# Patient Record
Sex: Male | Born: 1950 | Race: Black or African American | Hispanic: No | Marital: Single | Smoking: Current every day smoker
Health system: Southern US, Community
[De-identification: ages and names within clinical notes are randomized; demographics above are authoritative.]

## PROBLEM LIST (undated history)

## (undated) DIAGNOSIS — K219 Gastro-esophageal reflux disease without esophagitis: Secondary | ICD-10-CM

## (undated) DIAGNOSIS — M5431 Sciatica, right side: Secondary | ICD-10-CM

## (undated) DIAGNOSIS — M1990 Unspecified osteoarthritis, unspecified site: Secondary | ICD-10-CM

## (undated) DIAGNOSIS — M5432 Sciatica, left side: Secondary | ICD-10-CM

## (undated) DIAGNOSIS — E785 Hyperlipidemia, unspecified: Secondary | ICD-10-CM

## (undated) DIAGNOSIS — G629 Polyneuropathy, unspecified: Secondary | ICD-10-CM

## (undated) DIAGNOSIS — I10 Essential (primary) hypertension: Secondary | ICD-10-CM

## (undated) DIAGNOSIS — R9431 Abnormal electrocardiogram [ECG] [EKG]: Secondary | ICD-10-CM

## (undated) DIAGNOSIS — M549 Dorsalgia, unspecified: Secondary | ICD-10-CM

## (undated) DIAGNOSIS — Z8719 Personal history of other diseases of the digestive system: Secondary | ICD-10-CM

## (undated) DIAGNOSIS — R202 Paresthesia of skin: Secondary | ICD-10-CM

## (undated) DIAGNOSIS — E669 Obesity, unspecified: Secondary | ICD-10-CM

## (undated) DIAGNOSIS — R0602 Shortness of breath: Secondary | ICD-10-CM

## (undated) DIAGNOSIS — Z87898 Personal history of other specified conditions: Secondary | ICD-10-CM

## (undated) DIAGNOSIS — G8929 Other chronic pain: Secondary | ICD-10-CM

## (undated) DIAGNOSIS — Z720 Tobacco use: Secondary | ICD-10-CM

## (undated) DIAGNOSIS — M25552 Pain in left hip: Secondary | ICD-10-CM

## (undated) DIAGNOSIS — M25551 Pain in right hip: Secondary | ICD-10-CM

## (undated) HISTORY — DX: Tobacco use: Z72.0

## (undated) HISTORY — DX: Obesity, unspecified: E66.9

## (undated) HISTORY — DX: Abnormal electrocardiogram (ECG) (EKG): R94.31

## (undated) HISTORY — DX: Essential (primary) hypertension: I10

## (undated) HISTORY — PX: COLONOSCOPY: SHX174

## (undated) HISTORY — DX: Hyperlipidemia, unspecified: E78.5

## (undated) HISTORY — DX: Gastro-esophageal reflux disease without esophagitis: K21.9

## (undated) HISTORY — PX: JOINT REPLACEMENT: SHX530

## (undated) HISTORY — DX: Unspecified osteoarthritis, unspecified site: M19.90

## (undated) HISTORY — PX: CATARACT EXTRACTION: SUR2

---

## 1995-05-21 HISTORY — PX: HERNIA REPAIR: SHX51

## 2000-11-25 ENCOUNTER — Ambulatory Visit (HOSPITAL_COMMUNITY): Admission: RE | Admit: 2000-11-25 | Discharge: 2000-11-25 | Payer: Self-pay | Admitting: Family Medicine

## 2000-11-25 ENCOUNTER — Encounter: Payer: Self-pay | Admitting: Family Medicine

## 2001-12-12 ENCOUNTER — Emergency Department (HOSPITAL_COMMUNITY): Admission: EM | Admit: 2001-12-12 | Discharge: 2001-12-12 | Payer: Self-pay | Admitting: Emergency Medicine

## 2002-05-20 HISTORY — PX: LUMBAR SPINE SURGERY: SHX701

## 2002-07-22 ENCOUNTER — Encounter: Payer: Self-pay | Admitting: Family Medicine

## 2002-07-22 ENCOUNTER — Ambulatory Visit (HOSPITAL_COMMUNITY): Admission: RE | Admit: 2002-07-22 | Discharge: 2002-07-22 | Payer: Self-pay | Admitting: Family Medicine

## 2002-08-09 ENCOUNTER — Ambulatory Visit (HOSPITAL_COMMUNITY): Admission: RE | Admit: 2002-08-09 | Discharge: 2002-08-09 | Payer: Self-pay | Admitting: Neurosurgery

## 2002-08-11 ENCOUNTER — Encounter: Payer: Self-pay | Admitting: Neurosurgery

## 2002-08-16 ENCOUNTER — Ambulatory Visit (HOSPITAL_COMMUNITY): Admission: RE | Admit: 2002-08-16 | Discharge: 2002-08-16 | Payer: Self-pay | Admitting: *Deleted

## 2002-09-02 ENCOUNTER — Encounter: Admission: RE | Admit: 2002-09-02 | Discharge: 2002-09-02 | Payer: Self-pay | Admitting: Family Medicine

## 2002-09-02 ENCOUNTER — Encounter: Payer: Self-pay | Admitting: Family Medicine

## 2002-09-27 ENCOUNTER — Encounter: Payer: Self-pay | Admitting: *Deleted

## 2002-09-27 ENCOUNTER — Encounter (HOSPITAL_COMMUNITY): Admission: RE | Admit: 2002-09-27 | Discharge: 2002-10-27 | Payer: Self-pay | Admitting: *Deleted

## 2002-10-01 ENCOUNTER — Encounter: Payer: Self-pay | Admitting: Neurosurgery

## 2002-10-01 ENCOUNTER — Ambulatory Visit (HOSPITAL_COMMUNITY): Admission: RE | Admit: 2002-10-01 | Discharge: 2002-10-02 | Payer: Self-pay | Admitting: Neurosurgery

## 2002-11-02 ENCOUNTER — Encounter (HOSPITAL_COMMUNITY): Admission: RE | Admit: 2002-11-02 | Discharge: 2002-12-07 | Payer: Self-pay | Admitting: Neurosurgery

## 2003-05-21 HISTORY — PX: TOTAL HIP ARTHROPLASTY: SHX124

## 2003-12-29 ENCOUNTER — Encounter: Admission: RE | Admit: 2003-12-29 | Discharge: 2004-01-06 | Payer: Self-pay | Admitting: Orthopaedic Surgery

## 2004-01-03 ENCOUNTER — Inpatient Hospital Stay (HOSPITAL_COMMUNITY): Admission: RE | Admit: 2004-01-03 | Discharge: 2004-01-06 | Payer: Self-pay | Admitting: Orthopaedic Surgery

## 2006-02-21 ENCOUNTER — Encounter: Payer: Self-pay | Admitting: Cardiology

## 2006-02-21 LAB — CONVERTED CEMR LAB
Hemoglobin: 16.5 g/dL
MCV: 93 fL
WBC: 10.2 10*3/uL

## 2006-03-04 ENCOUNTER — Ambulatory Visit: Payer: Self-pay | Admitting: Cardiology

## 2006-03-13 ENCOUNTER — Ambulatory Visit: Payer: Self-pay | Admitting: Cardiology

## 2006-03-13 ENCOUNTER — Encounter (HOSPITAL_COMMUNITY): Admission: RE | Admit: 2006-03-13 | Discharge: 2006-04-12 | Payer: Self-pay | Admitting: Cardiology

## 2006-04-07 ENCOUNTER — Ambulatory Visit: Payer: Self-pay | Admitting: Cardiology

## 2006-05-14 ENCOUNTER — Ambulatory Visit: Payer: Self-pay | Admitting: Cardiology

## 2006-05-15 ENCOUNTER — Encounter: Payer: Self-pay | Admitting: Cardiology

## 2006-05-15 LAB — CONVERTED CEMR LAB
BUN: 19 mg/dL
Chloride: 103 meq/L

## 2006-06-02 ENCOUNTER — Ambulatory Visit: Payer: Self-pay | Admitting: Cardiology

## 2007-04-11 ENCOUNTER — Emergency Department (HOSPITAL_COMMUNITY): Admission: EM | Admit: 2007-04-11 | Discharge: 2007-04-11 | Payer: Self-pay | Admitting: Emergency Medicine

## 2007-04-11 ENCOUNTER — Ambulatory Visit (HOSPITAL_COMMUNITY): Admission: RE | Admit: 2007-04-11 | Discharge: 2007-04-11 | Payer: Self-pay | Admitting: Emergency Medicine

## 2007-04-18 ENCOUNTER — Encounter: Admission: RE | Admit: 2007-04-18 | Discharge: 2007-04-18 | Payer: Self-pay | Admitting: Orthopaedic Surgery

## 2007-04-30 ENCOUNTER — Ambulatory Visit (HOSPITAL_COMMUNITY): Admission: RE | Admit: 2007-04-30 | Discharge: 2007-05-01 | Payer: Self-pay | Admitting: Neurosurgery

## 2007-12-28 ENCOUNTER — Ambulatory Visit (HOSPITAL_COMMUNITY): Admission: RE | Admit: 2007-12-28 | Discharge: 2007-12-28 | Payer: Self-pay | Admitting: Family Medicine

## 2008-08-16 ENCOUNTER — Ambulatory Visit (HOSPITAL_COMMUNITY): Admission: RE | Admit: 2008-08-16 | Discharge: 2008-08-16 | Payer: Self-pay | Admitting: Family Medicine

## 2008-10-10 ENCOUNTER — Encounter: Payer: Self-pay | Admitting: Cardiology

## 2009-01-25 ENCOUNTER — Encounter: Payer: Self-pay | Admitting: Cardiology

## 2009-05-08 ENCOUNTER — Inpatient Hospital Stay (HOSPITAL_COMMUNITY): Admission: EM | Admit: 2009-05-08 | Discharge: 2009-05-11 | Payer: Self-pay | Admitting: Emergency Medicine

## 2009-05-16 ENCOUNTER — Telehealth (INDEPENDENT_AMBULATORY_CARE_PROVIDER_SITE_OTHER): Payer: Self-pay

## 2009-05-29 DIAGNOSIS — I10 Essential (primary) hypertension: Secondary | ICD-10-CM | POA: Insufficient documentation

## 2009-06-09 ENCOUNTER — Encounter: Payer: Self-pay | Admitting: Adult Health

## 2009-06-09 ENCOUNTER — Ambulatory Visit: Payer: Self-pay | Admitting: Cardiology

## 2009-06-10 ENCOUNTER — Encounter (INDEPENDENT_AMBULATORY_CARE_PROVIDER_SITE_OTHER): Payer: Self-pay | Admitting: *Deleted

## 2009-06-10 LAB — CONVERTED CEMR LAB
ALT: 22 units/L
AST: 18 units/L
Alkaline Phosphatase: 43 units/L
Alkaline Phosphatase: 43 units/L
Alkaline Phosphatase: 43 units/L
Bilirubin, Direct: 0.1 mg/dL
Bilirubin, Direct: 0.1 mg/dL
Cholesterol: 138 mg/dL
HDL: 32 mg/dL
HDL: 32 mg/dL
LDL Cholesterol: 72 mg/dL
LDL Cholesterol: 72 mg/dL
Total Protein: 7 g/dL
Triglycerides: 172 mg/dL

## 2009-06-12 ENCOUNTER — Encounter: Payer: Self-pay | Admitting: Adult Health

## 2009-06-16 ENCOUNTER — Encounter (INDEPENDENT_AMBULATORY_CARE_PROVIDER_SITE_OTHER): Payer: Self-pay | Admitting: *Deleted

## 2009-06-21 ENCOUNTER — Ambulatory Visit: Payer: Self-pay | Admitting: Cardiology

## 2009-06-21 ENCOUNTER — Encounter (HOSPITAL_COMMUNITY): Admission: RE | Admit: 2009-06-21 | Discharge: 2009-07-21 | Payer: Self-pay | Admitting: Cardiology

## 2009-06-21 ENCOUNTER — Encounter: Payer: Self-pay | Admitting: Cardiology

## 2009-06-23 ENCOUNTER — Encounter (INDEPENDENT_AMBULATORY_CARE_PROVIDER_SITE_OTHER): Payer: Self-pay | Admitting: *Deleted

## 2009-06-26 ENCOUNTER — Ambulatory Visit: Payer: Self-pay | Admitting: Cardiology

## 2010-01-23 ENCOUNTER — Encounter (INDEPENDENT_AMBULATORY_CARE_PROVIDER_SITE_OTHER): Payer: Self-pay | Admitting: *Deleted

## 2010-01-25 ENCOUNTER — Ambulatory Visit: Payer: Self-pay | Admitting: Cardiology

## 2010-01-25 DIAGNOSIS — Z720 Tobacco use: Secondary | ICD-10-CM

## 2010-01-25 DIAGNOSIS — E119 Type 2 diabetes mellitus without complications: Secondary | ICD-10-CM | POA: Insufficient documentation

## 2010-01-25 DIAGNOSIS — E785 Hyperlipidemia, unspecified: Secondary | ICD-10-CM

## 2010-01-25 DIAGNOSIS — E669 Obesity, unspecified: Secondary | ICD-10-CM | POA: Insufficient documentation

## 2010-01-26 ENCOUNTER — Encounter: Payer: Self-pay | Admitting: Cardiology

## 2010-02-19 ENCOUNTER — Ambulatory Visit (HOSPITAL_COMMUNITY): Admission: RE | Admit: 2010-02-19 | Discharge: 2010-02-19 | Payer: Self-pay | Admitting: Family Medicine

## 2010-03-24 ENCOUNTER — Encounter: Payer: Self-pay | Admitting: Cardiology

## 2010-03-24 LAB — CONVERTED CEMR LAB
ALT: 23 units/L (ref 0–53)
Alkaline Phosphatase: 49 units/L (ref 39–117)
Basophils Absolute: 0.1 10*3/uL (ref 0.0–0.1)
Eosinophils Absolute: 0.4 10*3/uL (ref 0.0–0.7)
Eosinophils Relative: 4 % (ref 0–5)
Glucose, Bld: 77 mg/dL (ref 70–99)
HCT: 45.3 % (ref 39.0–52.0)
LDL Cholesterol: 49 mg/dL (ref 0–99)
MCV: 94 fL (ref 78.0–100.0)
Platelets: 279 10*3/uL (ref 150–400)
RDW: 14.1 % (ref 11.5–15.5)
Sodium: 143 meq/L (ref 135–145)
Total Bilirubin: 0.3 mg/dL (ref 0.3–1.2)
Total Protein: 6.7 g/dL (ref 6.0–8.3)
Triglycerides: 127 mg/dL (ref <150)
VLDL: 25 mg/dL (ref 0–40)

## 2010-06-01 ENCOUNTER — Encounter
Admission: RE | Admit: 2010-06-01 | Discharge: 2010-06-01 | Payer: Self-pay | Source: Home / Self Care | Attending: Emergency Medicine | Admitting: Emergency Medicine

## 2010-06-19 NOTE — Miscellaneous (Signed)
Summary: lipidslft  Clinical Lists Changes  Observations: Added new observation of ALBUMIN: 4.6 g/dL (16/02/9603 5:40) Added new observation of PROTEIN, TOT: 7.0 g/dL (98/03/9146 8:29) Added new observation of SGPT (ALT): 22 units/L (06/10/2009 9:54) Added new observation of SGOT (AST): 18 units/L (06/10/2009 9:54) Added new observation of ALK PHOS: 43 units/L (06/10/2009 9:54) Added new observation of BILI DIRECT: 0.1 mg/dL (56/21/3086 5:78) Added new observation of LDL: 72 mg/dL (46/96/2952 8:41) Added new observation of HDL: 32 mg/dL (32/44/0102 7:25) Added new observation of TRIGLYC TOT: 172 mg/dL (36/64/4034 7:42) Added new observation of CHOLESTEROL: 138 mg/dL (59/56/3875 6:43)

## 2010-06-19 NOTE — Letter (Signed)
Summary: [facility]GRESS NOTE 10/24/09  [facility] PROGRESS NOTE 10/24/09   Imported By: Faythe Ghee 01/26/2010 11:59:48  _____________________________________________________________________  External Attachment:    Type:   Image     Comment:   External Document

## 2010-06-19 NOTE — Assessment & Plan Note (Signed)
Summary: past due for f/u per Larita Fife for meds/tg   Visit Type:  Follow-up Primary Provider:  Dr.Angus Mcinnis   History of Present Illness: Mr.Sean [name] is a moribly obese AAM who have have not seen for 20 months with multiple CVRF of DM,. Hypertension, Mixed hyperlipidemia, and ongoing tobacco abuse.  He is here for follow-up and med refills.  He denies any symptoms of chest pain, DOE, or decreased energy.  He continues to smoke 2ppd. He has no plans to quit.  He has had a recent admission to [facility] secondary to UTI in December of 2010 along with near syncope. He has been without conplaint since that time.  He does not exercise much secondary to arthritic hips and and L hip replacement.  He unfortunately has gained 23 lbs since we saw him in 2008.    Preventive Screening-Counseling & Management  Alcohol-Tobacco     Smoking Status: current     Smoking Cessation Counseling: yes     Smoke Cessation Stage: precontemplative     Packs/Day: 2.0  Current Problems (verified): 1)  Dyslipidemia  (ICD-272.4) 2)  Hypertension  (ICD-401.9) 3)  Dm  (ICD-250.00)  Current Medications (verified): 1)  Lotrel 10-40 Mg Caps (Amlodipine Besy-Benazepril Hcl) .... Take 1 Cap By Mouth Daily 2)  Niaspan 500 Mg Cr-Tabs (Niacin (Antihyperlipidemic)) .... Take 3 Tablets By Mouth Once Daily 3)  Toprol Xl 100 Mg Xr24h-Tab (Metoprolol Succinate) .... Take 1 Tablet By Mouth Once Daily 4)  Meloxicam 15 Mg Tabs (Meloxicam) .... Take 1 Tab Daily 5)  Chlorthalidone 25 Mg Tabs (Chlorthalidone) .... Take 1 Tab Daily 6)  Nexium 40 Mg Cpdr (Esomeprazole Magnesium) .... Take 1 Tab Daily 7)  Metformin Hcl 500 Mg Tabs (Metformin Hcl) .... Take 1 Tab Two Times A Day 8)  Cardura 4 Mg Tabs (Doxazosin Mesylate) .... Take 1 Tab Daily 9)  Trilipix 135 Mg Cpdr (Choline Fenofibrate) .... Take 1 Tab Daily 10)  Vytorin 10-40 Mg Tabs (Ezetimibe-Simvastatin) .... Take 1 Tab Daily 11)  Glipizide 10 Mg Tabs (Glipizide) .... Take 1 Tab Daily 12)   Aspirin 325 Mg Tabs (Aspirin) .... Take 1 Tab Daily 13)  Hydrocodone-Acetaminophen 5-500 Mg Tabs (Hydrocodone-Acetaminophen) .... Take As Directed  Allergies (verified): No Known Drug Allergies  Past History:  Past medical, surgical, family and social histories (including risk factors) reviewed, and no changes noted (except as noted below).  Past Medical History: Reviewed history from 05/29/2009 and no changes required. Current Problems:  DYSLIPIDEMIA (ICD-272.4) HYPERTENSION (ICD-401.9) DM (ICD-250.00)  Past Surgical History: Reviewed history from 05/29/2009 and no changes required. right total hip replacement  Family History: Reviewed history and no changes required.  Social History: Reviewed history from 05/29/2009 and no changes required. Full Time Single  Tobacco Use - Yes.  Alcohol Use - no Regular Exercise - no Drug Use - no Packs/Day:  2.0  Review of Systems       arthritic pain.  All other systems have been reviewed and are negative unless stated above.   Vital Signs:  Patient profile:   60 year old male Height:      73 inches Weight:      290 pounds BMI:     38.40 Pulse rate:   80 / minute BP sitting:   130 / 81  (right arm)  Vitals Entered By: Dreama Saa, CNA (June 09, 2009 11:03 AM)  Physical Exam  General:  Well developed, well nourished, in no acute distress. Head:  normocephalic and atraumatic Eyes:  PERRLA/EOM intact; conjunctiva and lids normal. Wears glasses Lungs:  Clear bilaterally to auscultation and percussion. Slight prolonged expiratory phase with scattered wheezes on expiration as well Heart:  S3 is noted but no evidence of CHF Abdomen:  Obese, NT 2+ BS Msk:  Dificulty with walking secondary to chronic hip pain Extremities:  trace left pedal edema and trace right pedal edema.   Neurologic:  Alert and oriented x 3. Psych:  Normal affect.   EKG  Procedure date:  06/09/2009  Findings:      Normal sinus rhythm with rate  of:  79bpm  Impression & Recommendations:  Problem # 1:  HYPERTENSION (ICD-401.9) BP is well controlled on current regimine.  I will order ECHO for LV fx in the setting of  longstanding HTN.  He will be also scheduled for Lexascan stress myoview with multiple CVRF.   His updated medication list for this problem includes:    Lotrel 10-40 Mg Caps (Amlodipine besy-benazepril hcl) .Marland Kitchen... Take 1 cap by mouth daily    Toprol Xl 100 Mg Xr24h-tab (Metoprolol succinate) .Marland Kitchen... Take 1 tablet by mouth once daily    Chlorthalidone 25 Mg Tabs (Chlorthalidone) .Marland Kitchen... Take 1 tab daily    Cardura 4 Mg Tabs (Doxazosin mesylate) .Marland Kitchen... Take 1 tab daily    Aspirin 325 Mg Tabs (Aspirin) .Marland Kitchen... Take 1 tab daily  Orders: 2-D Echocardiogram (2D Echo) Nuclear Stress Test (Nuc Stress Test)  Problem # 2:  DYSLIPIDEMIA (ICD-272.4) Fasting lipids and LFT's His updated medication list for this problem includes:    Niaspan 500 Mg Cr-tabs (Niacin (antihyperlipidemic)) .Marland Kitchen... Take 3 tablets by mouth once daily    Trilipix 135 Mg Cpdr (Choline fenofibrate) .Marland Kitchen... Take 1 tab daily    Vytorin 10-40 Mg Tabs (Ezetimibe-simvastatin) .Marland Kitchen... Take 1 tab daily  Future Orders: T-Hepatic Function [id number]) ... 06/10/2009 T-Lipid Profile [phone]) ... 06/10/2009  Problem # 3:  TOBACCO ABUSE (ICD-305.1) Advised to stop smoking.  He is unwilling to do so.  Patient Instructions: 1)  Your physician recommends that you schedule a follow-up appointment in: after test 2)  Your physician recommends that you return for lab work in: Next week 3)  Your physician has requested that you have an echocardiogram.  Echocardiography is a painless test that uses sound waves to create images of your heart. It provides your doctor with information about the size and shape of your heart and how well your heart's chambers and valves are working.  This procedure takes approximately one hour. There are no restrictions for this procedure. 4)  Your  physician has requested that you have an [employer]  For further information please visit [email or web address]  Please follow instruction sheet, as given.

## 2010-06-19 NOTE — Letter (Signed)
Summary: [place] [facility] Work Engineer, agricultural at [employer]  [address] [place] [address]   Phone: [phone]  Fax: [phone]     January 25, 2010 MRN: [id number]   [facility] [address] [place], [place]  [address]      YOUR LAB WORK IS DUE  March 27, 2010 _________________________________________  Please go to [facility], located across the street from [facility] on the second floor.  Hours are Monday - Friday 7am until 7:30pm         Saturday 8am until 12noon    _X_  DO NOT EAT OR DRINK AFTER MIDNIGHT EVENING PRIOR TO LABWORK  __ YOUR LABWORK IS NOT FASTING --YOU MAY EAT PRIOR TO LABWORK

## 2010-06-19 NOTE — Miscellaneous (Signed)
Summary: LIPIDS,LIVER 05/22/2009  Clinical Lists Changes  Observations: Added new observation of ALBUMIN: 4.6 g/dL (05/28/3233 57:32) Added new observation of PROTEIN, TOT: 7.0 g/dL (20/25/4270 62:37) Added new observation of SGPT (ALT): 22 units/L (06/10/2009 16:16) Added new observation of SGOT (AST): 18 units/L (06/10/2009 16:16) Added new observation of ALK PHOS: 43 units/L (06/10/2009 16:16) Added new observation of BILI DIRECT: 0.1 mg/dL (62/83/1517 61:60) Added new observation of LDL: 72 mg/dL (73/71/0626 94:85) Added new observation of HDL: 32 mg/dL (46/27/0350 09:38) Added new observation of TRIGLYC TOT: 172 mg/dL (18/29/9371 69:67) Added new observation of CHOLESTEROL: 138 mg/dL (89/38/1017 51:02)

## 2010-06-19 NOTE — Assessment & Plan Note (Signed)
Summary: 6 mth f/u per checkout on 06/26/09/tg   Visit Type:  Follow-up Referring Provider:  [facility] Primary Provider:  Chelsea Primus Mcinnis   History of Present Illness: [name] returns to the office as scheduled for continued assessment and treatment of cardiovascular risk factors without known cardiovascular disease.  Since his last visit, he has done superbly.  He experiences no cardiopulmonary symptoms.  His principal problems are orthopaedic with pain in the left hip since THRs some years ago, pain in the right hip and assorted myalgias and arthralgias.  He does not monitor blood pressure and sees his primary care doctor infrequently.  An annual physical is planned for next month.  Unfortunately, he continues to smoke cigarettes at a rate that he will only characterize as "too much".  Current Medications (verified): 1)  Lotrel 10-40 Mg Caps (Amlodipine Besy-Benazepril Hcl) .... Take 1 Cap By Mouth Daily 2)  Niaspan 500 Mg Cr-Tabs (Niacin (Antihyperlipidemic)) .... Take 3 Tablets By Mouth Once Daily 3)  Toprol Xl 100 Mg Xr24h-Tab (Metoprolol Succinate) .... Take 1 Tablet By Mouth Once Daily 4)  Meloxicam 15 Mg Tabs (Meloxicam) .... Take 1 Tab Daily 5)  Chlorthalidone 25 Mg Tabs (Chlorthalidone) .... Take 1 Tab Daily 6)  Nexium 40 Mg Cpdr (Esomeprazole Magnesium) .... Take 1 Tab Daily 7)  Metformin Hcl 500 Mg Tabs (Metformin Hcl) .... Take 1 Tab Two Times A Day 8)  Cardura 4 Mg Tabs (Doxazosin Mesylate) .... Take 1 Tab Daily 9)  Lipitor 40 Mg Tabs (Atorvastatin Calcium) .... Take 1 Tablet By Mouth At Bedtime 10)  Glipizide 10 Mg Tabs (Glipizide) .... Take 1 Tab Daily 11)  Aspirin 325 Mg Tabs (Aspirin) .... Take 1 Tab Daily 12)  Hydrocodone-Acetaminophen 5-500 Mg Tabs (Hydrocodone-Acetaminophen) .... Take As Directed  Allergies (verified): 1)  ! * Catapres Patch  Past History:   PMH, FH, and Social History reviewed and updated.  Review of Systems       See  history of present illness.  Vital Signs:  Patient profile:   60 year old male Weight:      289 pounds BMI:     38.27 Pulse rate:   83 / minute BP sitting:   140 / 88  (right arm)  Vitals Entered By: Dreama Saa, CNA (January 25, 2010 9:37 AM)  Physical Exam  General:  Obese; well developed; no acute distress:   Neck-No JVD; no carotid bruits: HEENT-frequently requested that I repeat myself during our meeting, but hearing was quite acute with air conduction approximately greater than bone conduction and with no lateralization to midline vibration Lungs-No tachypnea, no rales; no rhonchi; no wheezes: Cardiovascular-normal PMI; normal S1 and S2: Abdomen-BS normal; soft and non-tender without masses or organomegaly:  Musculoskeletal-No deformities, no cyanosis or clubbing: Neurologic-Normal cranial nerves; symmetric strength and tone:  Skin-Warm, no significant lesions:multiple small nevi with benign appearance, particularly over the shoulders and back Extremities-Distal pulses intact; no edema:     Impression & Recommendations:  Problem # 1:  TOBACCO ABUSE (ICD-305.1) The importance of discontinuing tobacco use was once again emphasized to Sean [name]; unfortunately, he notes that he enjoys cigarette smoking and is not prepared to attempt to quit.  He has never stopped for any significant amount of time in the past and has no motivation to do so.  Also of note from a health standpoint is that the patient has never undergone colonoscopy.  You may wish to discuss the advisability of doing so with him at  his next visit.  In the interim, I requested stool samples for Hemoccult testing.  Problem # 2:  HYPERLIPIDEMIA (ICD-272.4) Lipid profile earlier this year was excellent as noted below; however, patient is being treated with Vytorin and amlodipine.  The dose of simvastatin is far beyond that recommended by the FDA.  Accordingly, he will change to atorvastatin 40 mg q.d. with a repeat  lipid profile in 2 months.  CHOL: 138 (06/10/2009)   LDL: 72 (06/10/2009)   HDL: 32 (06/10/2009)   TG: 172 (06/10/2009)  Problem # 3:  OBESITY (ICD-278.00) Weight loss is advised; however, the patient reports difficulty with ambulation due to bilateral hip pain and other orthopaedic issues.  The importance of reduction of his caloric intake in the setting was emphasized.  I also suggested that he reconsult Dr. Cleophas Dunker since he has not been free of pain in the left hip since THA 5 years ago.  I will plan to see this nice gentleman again in one year.  Other Orders: Hemoccult Cards (Take Home) (Hemoccult Cards) Future Orders: T-Lipid Profile ([id number]) ... 03/27/2010 T-Comprehensive Metabolic Panel [phone]) ... 03/27/2010 T-CBC w/Diff ([id number]) ... 03/27/2010 T-TSH [phone]) ... 03/27/2010  Patient Instructions: 1)  Your physician recommends that you schedule a follow-up appointment in: 1 year 2)  Your physician recommends that you return for lab work in: 2 months 3)  Your physician has recommended you make the following change in your medication: Stop taking Vytorin and start taking Lipitor 40mg  by mouth at bedtime  4)  Your physician has asked that you test your stool for blood. It is necessary to test 3 different stool specimens for accuracy. You will be given 3 hemoccult cards for specimen collection. For each stool specimen, place a small portion of stool sample (from 2 different areas of the stool) into the 2 squares on the card. Close card. Repeat with 2 more stool specimens. Bring the cards back to the office for testing. Prescriptions: LIPITOR 40 MG TABS (ATORVASTATIN CALCIUM) take 1 tablet by mouth at bedtime  #30 x 11   Entered by:   Larita Fife Via LPN   Authorized by:   Kathlen Brunswick, MD, [facility]   Signed by:   Larita Fife Via LPN on 44/05/270   Method used:   Electronically to        [facility] (retail)       [address]        [place], [place]  [address]       Ph: [phone]       Fax: [phone]   RxID:   [id number]

## 2010-06-19 NOTE — Letter (Signed)
Summary: [address] Results Engineer, agricultural at [facility]  [address], [place] [address]   Phone: [phone]  Fax: [phone]      June 23, 2009 MRN: [id number]   [facility] [address] [place], [place]  [address]   Dear Mr. CULLINANE,  Your test ordered by Selena Batten has been reviewed by your physician (or physician assistant) and was found to be normal or stable. Your physician (or physician assistant) felt no changes were needed at this time.  ____ Echocardiogram  _x___ Cardiac Stress Test  _x___ Lab Work  ____ Peripheral vascular study of arms, legs or neck  ____ CT scan or X-ray  ____ Lung or Breathing test  ____ Other:  No change in medical treatment at this time, per Dr. Dietrich Pates.  Thank you, Rohin Krejci Allyne Gee RN    [place] Bing, MD, Lenise Arena.C.Gaylord Shih, MD, F.A.C.C Lewayne Bunting, MD, F.A.C.C Nona Dell, MD, F.A.C.C Charlton Haws, MD, Lenise Arena.C.C

## 2010-06-19 NOTE — Assessment & Plan Note (Signed)
Summary: f/u myoview and labwork/tg   Primary Provider:  Dr.Angus Mcinnis   History of Present Illness: [name] is a 60 y/0 obese AAM with known history of diabetes, hypertension. mixed hyperlipidemia and ongoing tobacco abuse.  We saw him last on 06/09/2009 on followup post hospitalization.  At that time we refilled his medications and schedule a stress myoview.  He had an episode of syncope prior to admission.  Stress test was negative for ischemia, diaphramatic attenuation was noted.  EF was 56%  We also completed a lipid profile.  TC 138, LDL 72;HDL 32; TG 172.  His main complaint is Right hip pain.  He is putting off having it replaced, but it is significantly limiting his mobility and ability to exercise.  Preventive Screening-Counseling & Management  Alcohol-Tobacco     Smoking Status: current     Smoking Cessation Counseling: yes     Packs/Day: 1.5     Pack years: 60  Current Medications (verified): 1)  Lotrel 10-40 Mg Caps (Amlodipine Besy-Benazepril Hcl) .... Take 1 Cap By Mouth Daily 2)  Niaspan 500 Mg Cr-Tabs (Niacin (Antihyperlipidemic)) .... Take 3 Tablets By Mouth Once Daily 3)  Toprol Xl 100 Mg Xr24h-Tab (Metoprolol Succinate) .... Take 1 Tablet By Mouth Once Daily 4)  Meloxicam 15 Mg Tabs (Meloxicam) .... Take 1 Tab Daily 5)  Chlorthalidone 25 Mg Tabs (Chlorthalidone) .... Take 1 Tab Daily 6)  Nexium 40 Mg Cpdr (Esomeprazole Magnesium) .... Take 1 Tab Daily 7)  Metformin Hcl 500 Mg Tabs (Metformin Hcl) .... Take 1 Tab Two Times A Day 8)  Cardura 4 Mg Tabs (Doxazosin Mesylate) .... Take 1 Tab Daily 9)  Trilipix 135 Mg Cpdr (Choline Fenofibrate) .... Take 1 Tab Daily 10)  Vytorin 10-40 Mg Tabs (Ezetimibe-Simvastatin) .... Take 1 Tab Daily 11)  Glipizide 10 Mg Tabs (Glipizide) .... Take 1 Tab Daily 12)  Aspirin 325 Mg Tabs (Aspirin) .... Take 1 Tab Daily 13)  Hydrocodone-Acetaminophen 5-500 Mg Tabs (Hydrocodone-Acetaminophen) .... Take As Directed  Allergies  (verified): No Known Drug Allergies  Past History:  Past medical, surgical, family and social histories (including risk factors) reviewed, and no changes noted (except as noted below).  Past Medical History: Reviewed history from 05/29/2009 and no changes required. Current Problems:  DYSLIPIDEMIA (ICD-272.4) HYPERTENSION (ICD-401.9) DM (ICD-250.00)  Past Surgical History: Reviewed history from 05/29/2009 and no changes required. right total hip replacement  Family History: Reviewed history and no changes required.  Social History: Reviewed history from 05/29/2009 and no changes required. Full Time Single  Tobacco Use - Yes.  Alcohol Use - no Regular Exercise - no Drug Use - no Packs/Day:  1.5 Pack years:  60  Review of Systems       Chronic R hip pain. All other systems have been reviewed and are negative unless stated above.   Vital Signs:  Patient profile:   60 year old male Weight:      288 pounds Pulse rate:   80 / minute BP sitting:   145 / 90  (left arm)  Vitals Entered ByLarita Fife Via LPN (June 26, 2009 3:33 PM)  Physical Exam  General:  Well developed, well nourished, in no acute distress. Lungs:  Clear bilaterally to auscultation and percussion. Smells of cigarettes Heart:  Non-displaced PMI, chest non-tender; regular rate and rhythm, S1, S2 without murmurs, rubs or gallops. Carotid upstroke normal, no bruit. Normal abdominal aortic size, no bruits. Femorals normal pulses, no bruits. Pedals normal pulses. No edema,  no varicosities. Abdomen:  Bowel sounds positive; abdomen soft and non-tender without masses, organomegaly, or hernias noted. No hepatosplenomegaly. Extremities:  No clubbing or cyanosis. Psych:  Normal affect.   Impression & Recommendations:  Problem # 1:  TOBACCO ABUSE (ICD-305.1) Assessment Comment Only Cessation is recommended to patient.  He has no desire to quit.  Problem # 2:  DYSLIPIDEMIA (ICD-272.4) Assessment: Comment  Only He will need no changes in meds at this time.  Follow-up labs in 6 months. His updated medication list for this problem includes:    Niaspan 500 Mg Cr-tabs (Niacin (antihyperlipidemic)) .Marland Kitchen... Take 3 tablets by mouth once daily    Trilipix 135 Mg Cpdr (Choline fenofibrate) .Marland Kitchen... Take 1 tab daily    Vytorin 10-40 Mg Tabs (Ezetimibe-simvastatin) .Marland Kitchen... Take 1 tab daily  Problem # 3:  HYPERTENSION (ICD-401.9) Assessment: Unchanged Essentially well; controlled.  He complaints of hip pain which causes his BP to increase at times.  He is in pain now. His updated medication list for this problem includes:    Lotrel 10-40 Mg Caps (Amlodipine besy-benazepril hcl) .Marland Kitchen... Take 1 cap by mouth daily    Toprol Xl 100 Mg Xr24h-tab (Metoprolol succinate) .Marland Kitchen... Take 1 tablet by mouth once daily    Chlorthalidone 25 Mg Tabs (Chlorthalidone) .Marland Kitchen... Take 1 tab daily    Cardura 4 Mg Tabs (Doxazosin mesylate) .Marland Kitchen... Take 1 tab daily    Aspirin 325 Mg Tabs (Aspirin) .Marland Kitchen... Take 1 tab daily  Patient Instructions: 1)  Your physician recommends that you schedule a follow-up appointment in: 6 months 2)  Your physician recommends that you continue on your current medications as directed. Please refer to the Current Medication list given to you today.

## 2010-06-19 NOTE — Miscellaneous (Signed)
Summary: labs lipids and liver 06/10/2009  Clinical Lists Changes  Observations: Added new observation of ALBUMIN: 4.6 g/dL (27/25/3664 40:34) Added new observation of PROTEIN, TOT: 7.0 g/dL (74/25/9563 87:56) Added new observation of SGPT (ALT): 22 units/L (06/10/2009 14:43) Added new observation of SGOT (AST): 18 units/L (06/10/2009 14:43) Added new observation of ALK PHOS: 43 units/L (06/10/2009 14:43) Added new observation of BILI DIRECT: 0.1 mg/dL (43/32/9518 84:16) Added new observation of LDL: 72 mg/dL (60/63/0160 10:93) Added new observation of HDL: 32 mg/dL (23/55/7322 02:54) Added new observation of TRIGLYC TOT: 172 mg/dL (27/10/2374 28:31) Added new observation of CHOLESTEROL: 138 mg/dL (51/76/1607 37:10)

## 2010-06-19 NOTE — Miscellaneous (Signed)
Summary: cardura refill  Clinical Lists Changes  Medications: Rx of CARDURA 4 MG TABS (DOXAZOSIN MESYLATE) take 1 tab daily;  #30 x 6;  Signed;  Entered by: Teressa Lower RN;  Authorized by: Joni Reining, NP;  Method used: Electronically to [facility] [address] [place], [place], [place]  [address], Ph: [phone], Fax: [phone]    Prescriptions: CARDURA 4 MG TABS (DOXAZOSIN MESYLATE) take 1 tab daily  #30 x 6   Entered by:   Teressa Lower RN   Authorized by:   Joni Reining, NP   Signed by:   Teressa Lower RN on 06/12/2009   Method used:   Electronically to        [facility] (retail)       [address]       [place], [place]  [address]       Ph: [phone]       Fax: [phone]   RxID:   [id number]

## 2010-06-19 NOTE — Letter (Signed)
Summary: [place] Treadmill (Nuc Med Stress)  [facility]  [address] [place] [address]   Phone: [phone]  Fax: [phone]    Nuclear Medicine 1-Day Stress Test Information Sheet  Re:     Sean [name]   DOB:     [birth year] MRN:     [id number] Weight:  Appointment Date: Register at: Appointment Time: Referring MD:  ___Exercise Stress  __Adenosine   __Dobutamine  _X_Lexiscan  __Persantine   __Thallium  Urgency: ____1 (next day)   ____2 (one week)    ____3 (PRN)  Patient will receive Follow Up call with results: Patient needs follow-up appointment:  Instructions regarding medication:  How to prepare for your stress test: 1. DO NOT eat or dring 6 hours prior to your arrival time. This includes no caffeine (coffee, tea, sodas, chocolate) if you were instructed to take your medications, drink water with it. 2. DO NOT use any tobacco products for at leaset 8 hours prior to arrival. 3. DO NOT wear dresses or any clothing that may have metal clasps or buttons. 4. Wear short sleeve shirts, loose clothing, and comfortalbe walking shoes. 5. DO NOT use lotions, oils or powder on your chest before the test. 6. The test will take approximately 3-4 hours from the time you arrive until completion. 7. To register the day of the test, go to the Short Stay entrance at [facility]. 8. If you must cancel your test, call [phone] as soon as you are aware.  After you arrive for test:   When you arrive at [facility], you will go to Short Stay to be registered. They will then send you to Radiology to check in. The Nuclear Medicine Tech will get you and start an IV in your arm or hand. A small amount of a radioactive tracer will then be injected into your IV. This tracer will then have to circulate for 30-45 minutes. During this time you will wait in the waiting room and you will be able to drink something without caffeine. A series of pictures will be taken  of your heart follwoing this waiting period. After the 1st set of pictures you will go to the stress lab to get ready for your stress test. During the stress test, another small amount of a radioactive tracer will be injected through your IV. When the stress test is complete, there is a short rest period while your heart rate and blood pressure will be monitored. When this monitoring period is complete you will have another set of pictrues taken. (The same as the 1st set of pictures). These pictures are taken between 15 minutes and 1 hour after the stress test. The time depends on the type of stress test you had. Your doctor will inform you of your test results within 7 days after test.    The possibilities of certain changes are possible during the test. They include abnormal blood pressure and disorders of the heart. Side effects of persantine or adenosine can include flushing, chest pain, shortness of breath, stomach tightness, headache and light-headedness. These side effects usually do not last long and are self-resolving. Every effort will be made to keep you comfortable and to minimize complications by obtaining a medical history and by close observation during the test. Emergency equipment, medications, and trained personnel are available to deal with any unusual situation which may arise.  Please notify office at least 48 hours in advance if you are unable to keep  this appt.

## 2010-07-09 ENCOUNTER — Other Ambulatory Visit: Payer: Self-pay | Admitting: Ophthalmology

## 2010-07-09 ENCOUNTER — Encounter (HOSPITAL_COMMUNITY): Payer: 59

## 2010-07-09 ENCOUNTER — Other Ambulatory Visit (HOSPITAL_COMMUNITY): Payer: Self-pay

## 2010-07-09 DIAGNOSIS — Z01812 Encounter for preprocedural laboratory examination: Secondary | ICD-10-CM | POA: Insufficient documentation

## 2010-07-09 LAB — BASIC METABOLIC PANEL
CO2: 28 mEq/L (ref 19–32)
Chloride: 101 mEq/L (ref 96–112)
Creatinine, Ser: 1.61 mg/dL — ABNORMAL HIGH (ref 0.4–1.5)
GFR calc Af Amer: 54 mL/min — ABNORMAL LOW (ref >60)
Potassium: 3.4 mEq/L — ABNORMAL LOW (ref 3.5–5.1)
Sodium: 141 mEq/L (ref 135–145)

## 2010-07-16 ENCOUNTER — Ambulatory Visit (HOSPITAL_COMMUNITY)
Admission: RE | Admit: 2010-07-16 | Discharge: 2010-07-16 | Disposition: A | Discharge status: Home / Self Care | Payer: 59 | Source: Ambulatory Visit | Attending: Ophthalmology | Admitting: Ophthalmology

## 2010-07-16 DIAGNOSIS — I10 Essential (primary) hypertension: Secondary | ICD-10-CM | POA: Insufficient documentation

## 2010-07-16 DIAGNOSIS — E119 Type 2 diabetes mellitus without complications: Secondary | ICD-10-CM | POA: Insufficient documentation

## 2010-07-16 DIAGNOSIS — H2510 Age-related nuclear cataract, unspecified eye: Secondary | ICD-10-CM | POA: Insufficient documentation

## 2010-07-16 DIAGNOSIS — Z01812 Encounter for preprocedural laboratory examination: Secondary | ICD-10-CM | POA: Insufficient documentation

## 2010-07-16 DIAGNOSIS — Z7982 Long term (current) use of aspirin: Secondary | ICD-10-CM | POA: Insufficient documentation

## 2010-07-16 DIAGNOSIS — Z79899 Other long term (current) drug therapy: Secondary | ICD-10-CM | POA: Insufficient documentation

## 2010-08-20 ENCOUNTER — Encounter (HOSPITAL_COMMUNITY): Payer: 59

## 2010-08-20 LAB — DIFFERENTIAL
Basophils Relative: 6 % — ABNORMAL HIGH (ref 0–1)
Eosinophils Absolute: 0.1 10*3/uL (ref 0.0–0.7)
Eosinophils Absolute: 0.4 10*3/uL (ref 0.0–0.7)
Eosinophils Relative: 1 % (ref 0–5)
Lymphs Abs: 2.1 10*3/uL (ref 0.7–4.0)
Monocytes Absolute: 1.2 10*3/uL — ABNORMAL HIGH (ref 0.1–1.0)
Monocytes Relative: 7 % (ref 3–12)
Neutrophils Relative %: 73 % (ref 43–77)

## 2010-08-20 LAB — BASIC METABOLIC PANEL
BUN: 32 mg/dL — ABNORMAL HIGH (ref 6–23)
CO2: 24 mEq/L (ref 19–32)
CO2: 26 mEq/L (ref 19–32)
Calcium: 8.8 mg/dL (ref 8.4–10.5)
Chloride: 106 mEq/L (ref 96–112)
Creatinine, Ser: 1.75 mg/dL — ABNORMAL HIGH (ref 0.4–1.5)
GFR calc Af Amer: >60 mL/min (ref >60)
GFR calc non Af Amer: >60 mL/min (ref >60)
Potassium: 3.4 mEq/L — ABNORMAL LOW (ref 3.5–5.1)
Sodium: 138 mEq/L (ref 135–145)

## 2010-08-20 LAB — URINALYSIS, ROUTINE W REFLEX MICROSCOPIC
Nitrite: NEGATIVE
Protein, ur: 100 mg/dL — AB
Urobilinogen, UA: 0.2 mg/dL (ref 0.0–1.0)

## 2010-08-20 LAB — CBC
HCT: 37.2 % — ABNORMAL LOW (ref 39.0–52.0)
HCT: 38.6 % — ABNORMAL LOW (ref 39.0–52.0)
Hemoglobin: 12.7 g/dL — ABNORMAL LOW (ref 13.0–17.0)
MCHC: 32.8 g/dL (ref 30.0–36.0)
MCHC: 32.9 g/dL (ref 30.0–36.0)
MCHC: 33.4 g/dL (ref 30.0–36.0)
MCHC: 33.8 g/dL (ref 30.0–36.0)
MCV: 91.3 fL (ref 78.0–100.0)
MCV: 93.2 fL (ref 78.0–100.0)
Platelets: 270 10*3/uL (ref 150–400)
Platelets: 300 10*3/uL (ref 150–400)
RBC: 4.18 MIL/uL — ABNORMAL LOW (ref 4.22–5.81)
RDW: 13.9 % (ref 11.5–15.5)
RDW: 13.9 % (ref 11.5–15.5)

## 2010-08-20 LAB — URINE MICROSCOPIC-ADD ON

## 2010-08-20 LAB — GLUCOSE, CAPILLARY
Glucose-Capillary: 119 mg/dL — ABNORMAL HIGH (ref 70–99)
Glucose-Capillary: 121 mg/dL — ABNORMAL HIGH (ref 70–99)
Glucose-Capillary: 132 mg/dL — ABNORMAL HIGH (ref 70–99)
Glucose-Capillary: 142 mg/dL — ABNORMAL HIGH (ref 70–99)
Glucose-Capillary: 184 mg/dL — ABNORMAL HIGH (ref 70–99)

## 2010-08-20 LAB — URINE CULTURE
Colony Count: NO GROWTH
Culture: NO GROWTH

## 2010-08-20 LAB — POCT CARDIAC MARKERS
CKMB, poc: 2.2 ng/mL (ref 1.0–8.0)
Myoglobin, poc: >500 ng/mL (ref 12–200)
Myoglobin, poc: >500 ng/mL (ref 12–200)

## 2010-08-20 LAB — COMPREHENSIVE METABOLIC PANEL
ALT: 28 U/L (ref 0–53)
AST: 20 U/L (ref 0–37)
Albumin: 3.6 g/dL (ref 3.5–5.2)
Alkaline Phosphatase: 44 U/L (ref 39–117)
Calcium: 9.3 mg/dL (ref 8.4–10.5)
GFR calc Af Amer: 32 mL/min — ABNORMAL LOW (ref >60)
Potassium: 3.3 mEq/L — ABNORMAL LOW (ref 3.5–5.1)
Sodium: 136 mEq/L (ref 135–145)
Total Protein: 7.1 g/dL (ref 6.0–8.3)

## 2010-08-20 LAB — TYPE AND SCREEN: Antibody Screen: NEGATIVE

## 2010-08-20 LAB — PSA: PSA: 4.53 ng/mL — ABNORMAL HIGH (ref 0.10–4.00)

## 2010-08-20 LAB — PROTIME-INR: Prothrombin Time: 12.9 seconds (ref 11.6–15.2)

## 2010-08-20 LAB — TROPONIN I: Troponin I: 0.01 ng/mL (ref 0.00–0.06)

## 2010-08-20 LAB — HEMOGLOBIN A1C
Hgb A1c MFr Bld: 7.1 % — ABNORMAL HIGH (ref 4.6–6.1)
Mean Plasma Glucose: 157 mg/dL

## 2010-08-20 LAB — CK TOTAL AND CKMB (NOT AT ARMC)
CK, MB: 2.2 ng/mL (ref 0.3–4.0)
Relative Index: 1.3 (ref 0.0–2.5)

## 2010-08-21 ENCOUNTER — Other Ambulatory Visit (HOSPITAL_COMMUNITY): Payer: 59

## 2010-08-27 ENCOUNTER — Ambulatory Visit (HOSPITAL_COMMUNITY)
Admission: RE | Admit: 2010-08-27 | Discharge: 2010-08-27 | Disposition: A | Discharge status: Home / Self Care | Payer: 59 | Source: Ambulatory Visit | Attending: Ophthalmology | Admitting: Ophthalmology

## 2010-08-27 DIAGNOSIS — J4489 Other specified chronic obstructive pulmonary disease: Secondary | ICD-10-CM | POA: Insufficient documentation

## 2010-08-27 DIAGNOSIS — J449 Chronic obstructive pulmonary disease, unspecified: Secondary | ICD-10-CM | POA: Insufficient documentation

## 2010-08-27 DIAGNOSIS — Z7982 Long term (current) use of aspirin: Secondary | ICD-10-CM | POA: Insufficient documentation

## 2010-08-27 DIAGNOSIS — Z79899 Other long term (current) drug therapy: Secondary | ICD-10-CM | POA: Insufficient documentation

## 2010-08-27 DIAGNOSIS — I10 Essential (primary) hypertension: Secondary | ICD-10-CM | POA: Insufficient documentation

## 2010-08-27 DIAGNOSIS — H2510 Age-related nuclear cataract, unspecified eye: Secondary | ICD-10-CM | POA: Insufficient documentation

## 2010-08-27 LAB — GLUCOSE, CAPILLARY: Glucose-Capillary: 95 mg/dL (ref 70–99)

## 2010-08-31 ENCOUNTER — Other Ambulatory Visit: Payer: Self-pay | Admitting: Cardiology

## 2010-09-26 ENCOUNTER — Other Ambulatory Visit: Payer: Self-pay | Admitting: Cardiology

## 2010-09-26 DIAGNOSIS — E875 Hyperkalemia: Secondary | ICD-10-CM

## 2010-10-02 NOTE — Op Note (Signed)
Sean [name], Sean [name]                ACCOUNT NO.:  [id number]   MEDICAL RECORD NO.:  [id number]          PATIENT TYPE:  OIB   LOCATION:  3036                         FACILITY:  [facility]   PHYSICIAN:  Danae Orleans. Venetia Maxon, M.D.  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   PROCEDURE:  Redo L4-5 left hemi-semilaminectomy with microdiskectomy and  microdissection.   SURGEON:  Dr. Maeola Harman.   ASSISTANT:  Clydene Fake, M.D. and Georgiann Cocker, RN.   ANESTHESIA:  General endotracheal anesthesia.   ESTIMATED BLOOD LOSS:  Minimal.   COMPLICATIONS:  None.   DISPOSITION:  Recovery.   INDICATIONS:  Sean [name] is a 60 year old man with a left L5  radiculopathy with significant left leg weakness.  He was found to have  a free fragment disk herniation at L4-5 on the left with significant  left L5 nerve root compression. He had previously undergone bilateral  laminotomies at L4-5 and L5-S1 for spondylosis and stenosis. It was  elected to take him to surgery for diskectomy at this level.   DESCRIPTION OF PROCEDURE:  Sean [name] was brought to the operating room.  Following satisfactory and uncomplicated induction of general  endotracheal anesthesia and placement of intravenous lines, the patient  was placed in the prone position on the Wilson frame.  His low back was  then shaved, prepped and draped in the usual sterile fashion.  The area  of the planned incision was infiltrated with 0.25% Marcaine and 0.5%  lidocaine with 1/200,000. The previous incision was reopened, carried  through copious adipose tissue.  The lumbodorsal fascia was incised to  the left side of midline.   Subperiosteal dissection was performed  exposing the previous laminotomy defects of L4-5 and L5-S1 and  intraoperative x-ray with marker probes at each of these levels  confirmed correct orientation.  A Versatrac retractor was placed to  facilitate exposure.  Under loupe magnification, a laminectomy of the  superior aspect of L5 was then performed and carried down with a  generous foraminotomy over the L5 nerve root.  A microscope was brought  into field and using microdissection technique, the lateral cecum thecal  sac was mobilized, the disk space was identified and after using a  variety of fine ball-tip probes, a large free fragment of disk material  was removed which was causing significant nerve root compression.  This  resulted in significant decompression of the thecal sac and the left L5.  Hemostasis was assured. The spinal canal was palpated and there did not  appear to be any residual herniated disk material. Depo-Medrol and  fentanyl were placed within the operative bed. The lumbodorsal fascia  was closed with #0 Vicryl sutures, subcutaneous tissue was  reapproximated  with 2-0 Vicryl interrupted inverted sutures and the skin edges  reapproximated with 3-0 Vicryl interrupted inverted  subcuticular stitch.  The wound was dressed with Dermabond.  The patient was extubated in the  operating room and taken to recovery having tolerated the procedure  well.      Danae Orleans. Venetia Maxon, M.D.  Electronically Signed     JDS/MEDQ  D:  04/30/2007  T:  05/01/2007  Job:  [id number]

## 2010-10-05 NOTE — Procedures (Signed)
   NAME:  Sean [name], Sean [name]                          ACCOUNT NO.:  [id number]   MEDICAL RECORD NO.:  [id number]                   PATIENT TYPE:  OUT   LOCATION:  RAD                                  FACILITY:  [facility]   PHYSICIAN:  Vida Roller, M.D.                DATE OF BIRTH:  [birth year]   DATE OF PROCEDURE:  08/16/2002  DATE OF DISCHARGE:                                  ECHOCARDIOGRAM   REFERRING PHYSICIAN:  Angus G. Renard Matter, M.D.   TAPE #:  [id number].   TAPE COUNT:  [phone] - [phone].   REASON FOR CONSULTATION:  This is a patient with an abnormal EKG and  hypertension.  Technical quality of the study is adequate for interpretation  and M-Mode tracing.   M-MODE MEASUREMENTS:  The aorta is 37 mm.   The left atrium is 48 mm, which is enlarged.   The septum is 17 mm, this is enlarged.   The posterior wall is 16 mm, which is enlarged.   Left ventricular diastolic dimension is 40 mm.   The ventricular systolic dimension is 28 mm.   2-D AND DOPPLER IMAGING:  The left ventricle is normal sized with normal  systolic function.  There is concentric left ventricular hypertrophy.  Diastolic function is not assessed.   The right ventricle is normal sized with normal systolic function.   Both atria appear enlarged, left greater than right.  There is no obvious  atrial septal defect.   The aortic valve is trileaflet, tricommissural, with no evidence of stenosis  or regurgitation.   The mitral valve is morphologically unremarkable with trace to mild mitral  insufficiency.  No stenosis is seen.   The tricuspid valve is not well seen but there appears to be only trace to  trivial regurgitation.  No stenosis is seen.   The pulmonic valve is not well seen.   The ascending aorta is not well seen but appears to be without significant  deformity.   The pericardial structures appear normal.   The inferior vena cava appears normal sized.                                               Vida Roller, M.D.    JH/MEDQ  D:  08/16/2002  T:  08/16/2002  Job:  [id number]   cc:   Angus G. Renard Matter, M.D.  [address]  [place]  [place] [address]  Fax: [phone]

## 2010-10-05 NOTE — Procedures (Signed)
NAMESUEDE, GREENAWALT                ACCOUNT NO.:  [id number]   MEDICAL RECORD NO.:  [id number]          PATIENT TYPE:  REC   LOCATION:                                FACILITY:  [facility]   PHYSICIAN:  Thomas C. Wall, MD, FACCDATE OF BIRTH:  06-17-1950   DATE OF PROCEDURE:  03/13/2006  DATE OF DISCHARGE:                                    STRESS TEST   EXERCISE TREADMILL/PRE-MYOVIEW:   INDICATIONS:  Abnormal EKG, multiple cardiac risk factors, dyspnea on  exertion.   Mr. Takacs's baseline blood pressure was 148/88 with a resting pulse of 84 and  regular.  He held his Toprol appropriately.  EKG is essentially normal.   He exercised for a total of 9 minutes and 2 seconds on a Bruce protocol.  Met level achieved was 10.1.   Target heart rate was met at 87% of predicted maximum heart rate.  Peak  blood pressure was 182/88.  There were no significant ST segment changes.  The test was stopped secondary to reaching adequate end point, and the  patient's limitations with his orthopedic issues.   ASSESSMENT:  1. Negative adequate exercise treadmill.  2. No arrhythmias.  3. Normal blood pressure response to exercise.  4. Myoview images pending.      Thomas C. Daleen Squibb, MD, [facility]  Electronically Signed     TCW/MEDQ  D:  03/13/2006  T:  03/14/2006  Job:  [id number]   cc:   Angus G. Renard Matter, MD  Fax: [phone]   Gerrit Friends. Dietrich Pates, MD, [facility]  [address]  [place], [place] [address]

## 2010-10-05 NOTE — Op Note (Signed)
NAME:  Sean [name], Sean [name]                          ACCOUNT NO.:  [id number]   MEDICAL RECORD NO.:  [id number]                   PATIENT TYPE:  OIB   LOCATION:  3011                                 FACILITY:  [facility]   PHYSICIAN:  Danae Orleans. Venetia Maxon, M.D.               DATE OF BIRTH:  1950/06/14   DATE OF PROCEDURE:  10/01/2002  DATE OF DISCHARGE:  10/02/2002                                 OPERATIVE REPORT   PREOPERATIVE DIAGNOSES:  Lumbar spinal stenosis, spondylosis, degenerative  disk disease, lumbar radiculopathy, L4-L5 and L5-S1 levels.   POSTOPERATIVE DIAGNOSES:  Lumbar spinal stenosis, spondylosis, degenerative  disk disease, lumbar radiculopathy, L4-L5 and L5-S1 levels.   PROCEDURE:  Bilateral laminotomies L4-L5 and L5-S1 with microdissection,  bilateral laminotomies and foraminotomies L4-L5 and L5-S1 with  microdissection.   SURGEON:  Danae Orleans. Venetia Maxon, M.D.   ASSISTANT:  Hewitt Shorts, M.D.   ANESTHESIA:  General endotracheal anesthesia.   ESTIMATED BLOOD LOSS:  100 cc.   COMPLICATIONS:  None.   DISPOSITION:  Recovery.   INDICATIONS:  This patient is a 60 year old man with lumbar spinal stenosis  L4-L5 and L5-S1 levels, left greater than right, with lateral recess  stenosis at each of these levels.  We have elected to take him to surgery  for decompression and foraminotomies at L4-L5 and L5-S1.   DESCRIPTION OF PROCEDURE:  The patient was brought to the operating room.  Following satisfactory and uncomplicated induction of general endotracheal  anesthesia and placement of intravenous line, the patient was placed in the  prone position on a Wilson frame.  His low back was then shaved, prepped,  and draped in the usual sterile fashion.  The area of planned incision was  infiltrated with 0.25% Marcaine and 0.5% lidocaine with 1:200,000  epinephrine.  Incision was made in the midline and carried through adipose  tissue to the lumbodorsal fascia which was incised  bilaterally.  Subperiosteal dissection was performed exposing the L4-L5 and L5-S1  interspaces.  After intraoperative x-ray confirmed the correct level,  hemisemilaminotomies of L4 and L5 were performed.  This was completed with  Kerrison rongeur, and redundant ligamentum flavum was removed decompressing  the central spinal dura as well as the lateral recesses and foraminae.  The  L4 and L5 nerve roots were decompressed so as to have unencumbered egress  from the neural foraminae, and similarly the S1 nerve roots were  decompressed as well.  Microscope was used to perform a microdissection and  to mobilize the thecal sac and nerve roots and confirm decompression of the  lateral recesses bilaterally at each of these levels.  Subsequently, the  wound was copiously irrigated with bacitracin and saline.  Self-retaining  retractor was removed.  The microscope was taken out of the field. The  lumbodorsal fascia was close with 0 Vicryl sutures.  The subcutaneous  tissues were reapproximated with 2-0 Vicryl interrupted inverted sutures,  and the skin edge were reapproximated with interrupted  3-0 Vicryl subcuticular stitch.  The wound was dressed with Dermabond.  The  patient was extubated in the operating room and taken to recovery in stable  and satisfactory condition having tolerated the procedure well.  Counts were  correct.                                               Danae Orleans. Venetia Maxon, M.D.    JDS/MEDQ  D:  10/01/2002  T:  10/02/2002  Job:  [id number]

## 2010-10-05 NOTE — Discharge Summary (Signed)
NAMEBURNIS, HALLING                ACCOUNT NO.:  [id number]   MEDICAL RECORD NO.:  [id number]          PATIENT TYPE:  INP   LOCATION:  5021                         FACILITY:  [facility]   PHYSICIAN:  Claude Manges. Whitfield, M.D.DATE OF BIRTH:  [birth year]   DATE OF ADMISSION:  01/03/2004  DATE OF DISCHARGE:  01/06/2004                                 DISCHARGE SUMMARY   ADMISSION DIAGNOSES:  1.  End-stage osteoarthritis, bilateral hips, left greater than right.  2.  Hypertension.  3.  Gastroesophageal reflux disease.  4.  Hiatal hernia.  5.  Hypercholesterolemia.  6.  Tobacco abuse.  7.  Obesity.   DISCHARGE DIAGNOSES:  1.  Status post total hip arthroplasty.  2.  Acute blood loss anemia secondary to surgery.  3.  Hyponatremia.  4.  Hypokalemia treated.  5.  Hypertension.  6.  Gastroesophageal reflux disease.  7.  Hiatal hernia.  8.  Hypercholesterolemia.  9.  Tobacco abuse.  10. Obesity.   HISTORY OF PRESENT ILLNESS:  Mr. Haslam is a 60 year old African American male  with a history of lumbar surgery by Dr. Venetia Maxon last year.  The patient is  having left leg pain for the past two years and this has attributed to some  back.  Since his back surgery, his left leg pain has continued.  He has no  known injury or any other surgeries to his left hip.  At this point the  patient's left leg pain is described as intermittent sharp, burning  sensation on the left groin area with some radiation down to his left knee,  occasionally to his back.  The patient in the left hip is increased with  prolonged standing or walking.  It decreases with rest.  The patient has  difficulty donning socks and shoes.  He has complaints of catching and  locking at times.  The patient is currently taking Vicodin for pain that  improves his hip pain now.  He does not use any ambulatory devices to assist  him.   ALLERGIES:  No known drug allergies.   MEDICATIONS:  1.  Hyzaar 100/25 mg one tablet p.o. q.a.m.  2.   Norvasc 10 mg one tablet p.o. q.a.m.  3.  Hydrocodone 5/500 one tablet p.o. q.a.m.  4.  Nexium 40 mg one tablet p.o. q.a.m.  5.  Toprol-XL 50 mg one tablet p.o. q.a.m.  6.  Mobic 15 mg one tablet q.a.m.  7.  Elavil 25 mg one tablet p.o. q.p.m.  8.  Hydrochlorothiazide 25 mg one tablet p.o. q p.m.  9.  Vytorin 10/40 mg one tablet p.o. q.p.m.   SURGICAL PROCEDURE:  The patient was taken to the operating room on January 03, 2004 by Dr. Norlene Campbell, assisted by Dr. Vear Clock and Arnoldo Morale, PA.  The patient was placed under general anesthesia and a left total  hip arthroplasty was performed.  The following components were used:  DePuy  AML, small stature 6.5 mm femoral component with a 32 mm hip __________  and  a +1 neck length, 56 mm outer diameter Trispike acetabular  component with a  Marathon polyethylene liner with a 10-degree lip.  The patient tolerated the  procedure well and returned to recovery in good and stable condition.   CONSULTS:  The following consults were obtained while the patient was  hospitalized:  1.  PT/OT.  2.  Case management.   HOSPITAL COURSE:  Postoperative day #1 the patient developed acute  __________  secondary to surgery, remained asymptomatic.   Postoperative day #2 the patient's H&H was 10.6 and 30.8.  The patient  remained asymptomatic.  The patient also developed hypokalemia and  hyponatremia. These were treated.   Postoperative day #3 the patient was performing well with physical therapy.  H&H were stable.  Hypokalemia and hyponatremia were improving.  The  patient's vital signs were stable and the patient was afebrile.  The patient  was discharged to home and in good stable condition.   LABORATORY DATA:  Routine labs on admission showed CBC with white blood  count 10,700, hemoglobin 14.3, hematocrit 41.6, platelets 288.  Coags on  admission were all within normal limits.  Routine chemistries on admission  all values within normal  limits.  Hepatic enzymes on admission were all  within normal limits.  Urinalysis on admission negative.  ECG performed on  December 29, 2003 showed normal sinus rhythm, heart rate of 79 beats per  minute, PR interval 174 msec, PRT axis __________ .  X-ray of the left hip  two views performed on January 03, 2004 showed left hip prosthesis being in  good position, good alignment. No fracture dislocation noted.  Some mild  lucency surrounding a portion of the acetabular cup.  This was felt to  represent a gap between the acetabular cup and the native acetabulum.   DISCHARGE MEDICATIONS:  The patient is to resume regular home medications.  Add the following:  1.  OxyContin sustained release 10 mg one tablet every 12 hours until gone.  2.  Percocet 5 mg one to two tablets every four to six hours p.r.n. pain.  3.  K-Dur 20 mEq one tablet three times a day until gone.  4.  Coumadin 7.5 mg p.o. each day at dinner time.  Start on Saturday.   DIET:  No restrictions.   ACTIVITY:  As tolerated, 50% weightbearing on the left leg only.   WOUND CARE:  The patient to keep wound clean and dry.  Change dressing  daily.  Call our office if any signs of infection or concerns.   FOLLOW UP:  The patient needs followup with Dr. Cleophas Dunker in his office 10  days from surgery.  He is to call [phone] for appointment.       GC/MEDQ  D:  02/14/2004  T:  02/14/2004  Job:  [id number]

## 2010-10-05 NOTE — H&P (Signed)
NAME:  Sean [name], Sean [name]                          ACCOUNT NO.:  [id number]   MEDICAL RECORD NO.:  [id number]                   PATIENT TYPE:  INP   LOCATION:  NA                                   FACILITY:  [facility]   PHYSICIAN:  Claude Manges. Cleophas Dunker, M.D.            DATE OF BIRTH:  July 24, 1950   DATE OF ADMISSION:  12/15/2003  DATE OF DISCHARGE:                                HISTORY & PHYSICAL   CHIEF COMPLAINT:  Left hip pain for the last 2+ years.   HISTORY OF PRESENT ILLNESS:  This 60 year old black male patient presented  to Dr. Cleophas Dunker with a history of lumbar back surgery last year by Dr.  Venetia Maxon.  He had been having about a two-year history of left leg pain that  was felt mostly attributable to his back.  Since his back surgery, however,  his left leg pain has continued.  He has had no known injury or any other  surgery to his hip.   At this point the pain in the leg is intermittent, described as a sharp or  burning sensation in that left groin area with some radiation down into his  knee and occasionally into his back.  It increases with any prolonged  standing or walking and decreases with rest.  He does have difficulty  putting on his socks and shoes, and he complains of his hip catching and  locking at times.  It also seems to slip at times.  He can sleep on that  left side.  He is currently taking Vicodin for pain, and that provides  minimal relief.  He is not ambulating with any assistive devices.   ALLERGIES:  No known drug allergies.   CURRENT MEDICATIONS:  1. Hyzaar 100/25 mg one tablet p.o. q.a.m.  2. Norvasc 10 mg one tablet p.o. q.a.m.  3. Hydrocodone 5/500 mg one tablet p.o. q.a.m.  4. Nexium 40 mg one tablet p.o. q.a.m.  5. Toprol XL 50 mg one tablet p.o. q.a.m.  6. Mobic 15 mg one tablet p.o. q.a.m., last dose to be July 24.  7. Elavil 25 mg one tablet p.o. q.p.m.  8. Hydrochlorothiazide 25 mg one tablet p.o. q.p.m.  9. Vytorin 10/40 mg one tablet p.o.  q.p.m.   PAST MEDICAL HISTORY:  1. Hypertension for a year.  2. Gastroesophageal reflux disease.  3. Hiatal hernia.  4. Hypercholesterolemia.   He denies any history of diabetes mellitus, thyroid disease, peptic ulcer  disease, heart disease, asthma, or any other chronic medical condition other  than noted previously.   PAST SURGICAL HISTORY:  1. Double herniorrhaphy by Dr. Katrinka Blazing in 1997.  2. Lumbar surgery by Dr. Venetia Maxon, 2004.   He denies any complications from the above-mentioned procedures.   SOCIAL HISTORY:  He has probably about a 50-60 pack-year history of  cigarette smoking.  He currently smokes two packs of cigarettes a day.  He  drinks two  alcoholic shots a day.  He has done that for many years.  He does  not use any drugs.  He is single and has two sons.  He lives with his sons  in a one-story house with four steps into the main entrance.  His oldest son  is in the [employer] at this time.  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  His medical doctor is Dr. Renard Matter in [place], [place], and his phone number is [phone].   FAMILY HISTORY:  Mother died at the age of 58 with hypertension, diabetes,  and a stroke.  Father died at the age of 1 with no major medical problems.  He has one brother alive at age 43 and a sister alive at age 1, and they  are both well.  His sons are age 71 and 31, and they are both alive and  well.   REVIEW OF SYSTEMS:  He does have a constant dry mouth due to his  medications.  He has had problems with his foreskin being tight for the last  couple of weeks, and he has been referred to a urologist in [place] for  that.  He does have numbness in both feet since his lumbar spine surgery.  He does not have a living will nor a power of attorney.  All other systems  are negative and noncontributory.   PHYSICAL EXAMINATION:  GENERAL:  A well-developed, well-nourished, mildly  overweight black male in no acute distress.  Walks  with an antalgic gait.  Mood and affect are appropriate.  He talks easily with the examiner.  VITAL SIGNS:  Height is 6 feet 1 inch, weight 260 pounds.  BMI is 34.  Temperature 98.2 degrees Fahrenheit, pulse 84, respirations 16, and BP  92/64.  HEENT:  Normocephalic, atraumatic without frontal or maxillary sinus  tenderness to palpation.  The conjunctivae are pink, sclerae are anicteric.  PERRLA.  EOMs intact.  No visible external ear deformities.  Hearing grossly  intact.  Tympanic membranes pearly gray bilaterally with a large amount of  cerumen in both ear canals.  Nose and nasal septum midline.  Nasal mucosa  pink and moist without exudates or polyps noted.  Buccal mucosa pink and  moist.  Good dentition. Pharynx without erythema or exudate.  Tongue and  uvula midline.  Tongue without fasciculations, and uvula rises equally with  phonation.  NECK:  No visible masses or lesions.  Trachea midline.  No palpable  lymphadenopathy nor thyromegaly.  Carotids +2 bilaterally without bruits.  Full range of motion and nontender to palpation along the cervical spine.  CARDIOVASCULAR:  Heart rate and rhythm regular.  S1, S2 present without  rubs, clicks, or murmurs noted.  CHEST:  Respirations are even and unlabored.  He does have some coarse  breath sounds noted in the left lung fields, which clear with coughing.  He  does have a nonproductive cough.  ABDOMEN:  Rounded abdominal contour.  Bowel sounds present x4 quadrants.  Soft, nontender to palpation, without hepatosplenomegaly nor CVA tenderness.  Femoral pulses +1 bilaterally.  BACK:  Nontender to palpation along the entire length of the vertebral  column.  BREASTS, GENITOURINARY, RECTAL:  These exams deferred at this time.  MUSCULOSKELETAL:  No obvious deformities, bilateral upper extremities, with  full range of motion of these extremities without pain.  Radial pulses +2. He has full range of motion of his knees, ankles, and toes  bilaterally.  DP  and  PT pulses are +2.  No lower extremity edema.  The right hip has full  extension and flexion to about 100 degrees with good internal-external  rotation without pain.  No pain with palpation about the hip.  The left hip  has full extension but flexion only to 80 degrees, and his hip externally  rotates as you bring him up into flexion.  He has markedly decreased  internal and external rotation when compared to the right, and these  movements do cause pain.  NEUROLOGIC:  Alert and oriented x3.  Cranial nerves II-XII are grossly  intact.  Strength 5/5 bilateral upper and lower extremities.  Rapid  alternating movements intact.  Deep tendon reflexes 2+ bilateral upper and  lower extremities.  Sensation intact to light touch.   RADIOLOGIC FINDINGS:  X-rays taken of both hips in March 2005 show  significant osteoarthritis in both hip joints, more so on the left than the  right, where he had superior sclerosis decreasing the joint space and an  ovoid position of the femoral head.   IMPRESSION:  1. End-stage osteoarthritis, bilateral hips, left worse than right.  2. Hypertension.  3. Gastroesophageal reflux disease.  4. Hiatal hernia.  5. Hypercholesterolemia.  6. Tobacco abuse.  7. Obesity.   PLAN:  Mr. Kerstein will be admitted to [facility] on December 15, 2003,  where he will undergo a left total hip arthroplasty by Claude Manges. Cleophas Dunker,  M.D.  He will undergo all the routine preoperative laboratory tests and  studies prior to this procedure.  If we have any medical issues while he is  hospitalized, we will consult one of the hospitalists.      Legrand Pitts Duffy, P.A.                      Claude Manges. Cleophas Dunker, M.D.    KED/MEDQ  D:  12/05/2003  T:  12/05/2003  Job:  [id number]

## 2010-10-05 NOTE — Letter (Signed)
June 02, 2006    Angus G. Renard Matter, MD  [address]  [place], [place]  [address]   RE:  DRESEAN, BECKEL  MRN:  [id number]  /  DOB:  [birth year]   Dear Thalia Party:   Mr. Voorhees returns to the office for continued management of multiple  cardiovascular risk factors without known coronary disease.  Since his  last visit, he has maintained a list of blood pressures, which have been  excellent.  Diabetic control is superb with a near normal hemoglobin A1c  level in October.  His last lipid profile showed excellent total  cholesterol and LDL, but high triglycerides and low HDL.  This pattern  would likely improve with improved diabetic control.   CURRENT MEDICATIONS:  Include:  1. Nexium 40 mg daily.  2. Vytorin 10/40 mg daily.  3. Mobic 15 mg daily.  4. Metformin 1500 mg daily.  5. Glipizide 10 mg daily.  6. Aspirin 81 mg daily.  7. Niaspan 1500 mg daily.  8. Lotrel 10/40 mg daily.  9. Toprol XL 100 mg daily.  10.Cardura 4 mg daily.  11.Chlorthalidone 25 mg daily.   Mr. Minner continues to smoke cigarettes.  You discussed treatment with  Chantix but his prescription is not covered by his health insurance, and  he does not wish to pay for it.   EXAM:  A pleasant gentleman in no acute distress.  The weight is 277 - stable.  Blood pressure 130/85.  Heart rate 75 and regular.  NECK:  No jugular venous distention.  LUNGS:  Increased expiratory phase.  CARDIAC:  Normal first and second heart sounds.   IMPRESSION:  Mr. Schafer is doing generally well.  We will attempt to  secure a supply of Chantix for him but his motivation to stop smoking  continues to appear weak.  I will see him again in 9 months.  A lipid  profile will be reassessed in 6 months.    Sincerely,      Gerrit Friends. Dietrich Pates, MD, [facility]  Electronically Signed    RMR/MedQ  DD: 06/02/2006  DT: 06/02/2006  Job #: [id number]

## 2010-10-05 NOTE — Letter (Signed)
March 04, 2006     Angus G. Renard Matter, MD  [address]  [place], [place] [address]   RE:  Sean [name], Sean [name]  MRN:  [id number]  /  DOB:  1951-03-08   Dear Thalia Party:   It was my pleasure reassessing Sean [name] in consultation today in the office.  As you know, Sean [name] was formerly a patient but has not been seen for a number of  years.  Sean [name] was initially evaluated for an abnormal EKG with multiple  cardiovascular risk factors, most notably diabetes, hypertension, and  hyperlipidemia.  Sean [name] underwent a stress nuclear study that was negative for  ischemia.  Sean [name] has been asymptomatic since then and has done generally well.  Sean [name] is referred back for optimal management of risk factors and for repeat  stress testing.   Sean [name] works at a [employer].  Sean [name] has no problems performing his daily  activities there.  Since Sean [name] was last seen by [place], Sean [name] has undergone  lumbosacral spinal surgery and total hip replacement, both of which have  been quite successful.  There also is a history of GERD.   The patient's past medical, social, and family history were updated.  Sean [name]  reports the development of numbness in both feet.   CURRENT MEDICATIONS:  1. Hyzaar 100/25 mg daily.  2. Toprol 50 mg daily.  3. Nexium 40 mg daily.  4. Vytorin 10/40 mg daily.  5. Amlodipine 10 mg daily.  6. Mobic 15 mg daily.  7. Metformin 1500 mg daily.  8. Glipizide 10 mg daily.  9. Aspirin 325 mg daily.  10.Niaspan 500 mg daily.   On exam, pleasant muscular gentleman in no acute distress.  The weight is  251, 20 pounds less than in 2004.  Blood pressure 160/95, heart rate 75 and  regular, respirations 20.  HEENT:  Visualization of fundi was difficult; there appeared to be grade 1  hypertensive changes.  NECK:  No jugular venous distention, normal carotid upstrokes without  bruits.  ENDOCRINE:  No thyromegaly.  HEMATOPOIETIN:  No adenopathy.  SKIN:  No significant lesions.  LUNGS:  Clear, resonant to percussion.  CARDIAC:   Normal first and second heart sounds.  ABDOMEN:  Soft, nontender, no organomegaly, normal bowel sounds, no masses.  EXTREMITIES:  Distal pulses intact, no edema.   Recent labs showed a normal CBC and normal chemistry profile.  Total  cholesterol was excellent at 130 as was LDL at 42, however, HDL was low at  30 and triglycerides were high at 288.  Hemoglobin A1c was excellent at 6.2.  TFTs were normal.   IMPRESSION:  Sean [name] is doing generally well.  Control of hypertension is  suboptimal.  In the presence of diabetes, we are seeking systolics below 130  and diastolics below 80.  Accordingly, Hyzaar will be discontinued.  Amlodipine will be changed to Lotrel 10/40 mg daily.  Chlorthalidone will be  added at a dose of 12.5 mg daily.  Transdermal Clonidine will be added at  level 2.  The patient is advised to discontinue Mobic if possible, since  that may be contributing to hypertension.   With respect to hyperlipidemia, his control is fairly good.  Since Sean [name] is  already taking Niaspan, we will try to optimize the dose up to 1500 mg per  day.  A stress nuclear study will be performed.  I will reassess this nice  gentleman again in one month.    Sincerely,  Gerrit Friends. Dietrich Pates, MD, [facility]    RMR/MedQ  /  Job #:  [id number]  DD:  03/04/2006 / DT:  03/05/2006

## 2010-10-05 NOTE — Letter (Signed)
April 07, 2006    Sean [name] Matter, MD  [address]  [place], [place] [address]   RE:  Sean [name], Sean [name]  MRN:  [id number]  /  DOB:  01/11/1951   Dear Thalia Party:   [name] returns to the office for continued assessment and treatment of  cardiovascular risk factors.  Since his last visit, he has done fairly well.  He notes significant skin irritation with clonidine patches.  He has not  been assessing blood pressure although he has a cuff at home.   Current medications include:  1. Toprol-XL 50 mg daily.  2. Nexium 40 mg daily.  3. Vytorin 10/40 mg daily.  4. Mobic 15 mg daily.  5. Glucophage 1500 mg daily.  6. Glipizide 10 mg daily.  7. Aspirin 325 mg daily.  8. Niaspan 1500 mg daily.  9. Lotrel 10/40 mg daily.  10.Chlorthalidone 12.5 mg daily.  11.Clonidine-TTS-2.   EXAMINATION:  GENERAL:  Pleasant, overweight gentleman.  VITAL SIGNS:  The weight is 276, 25 pounds more than 1 month ago.  Blood  pressure 140/95, heart rate 75 and regular, respirations 16.  NECK:  No jugular venous distention; normal carotid upstrokes without  bruits.  LUNGS:  Increased expiratory phase; minor inspiratory and expiratory  rhonchi.  CARDIAC:  Normal first and second heart sounds.   A stress nuclear study was performed.  Exercise tolerance was adequate.  There was no electrocardiographic or scintigraphic evidence for ischemia.  LV systolic function was mildly impaired with a suggestion of inferior wall  scarring.   IMPRESSION:  [name] is doing generally well.  Control of hypertension  remains suboptimal.  His dose of Toprol-XL will be increased to 100 mg  daily.  His dose of diuretic will be increased to 25 mg daily.  Cardura 4 mg  will be substituted for clonidine.  He will monitor blood pressures at home  and return to the cardiac nurses in 1 month.  I will see him again in 2  months.  A chemistry profile will be rechecked in the near future.   Lipid management is generally good  with only triglycerides elevated.  This  despite optimal control of diabetes.  I do not think it worthwhile to add a  third lipid-lowering agent.   Cigarette smoking remains a problem.  [name] is not currently motivated to  quit.  I asked him to think about it and perhaps give stopping a try.  He is  worried about weight gain.    Sincerely,      Gerrit Friends. Dietrich Pates, MD, [facility]  Electronically Signed    RMR/MedQ  DD: 04/07/2006  DT: 04/07/2006  Job #: [id number]

## 2010-10-05 NOTE — Op Note (Signed)
NAME:  Sean [name], Sean [name]                          ACCOUNT NO.:  [id number]   MEDICAL RECORD NO.:  [id number]                   PATIENT TYPE:  INP   LOCATION:  5021                                 FACILITY:  [facility]   PHYSICIAN:  Claude Manges. Cleophas Dunker, M.D.            DATE OF BIRTH:  1950/07/05   DATE OF PROCEDURE:  01/03/2004  DATE OF DISCHARGE:                                 OPERATIVE REPORT   PREOPERATIVE DIAGNOSIS:  End-stage osteoarthritis, left hip.   POSTOPERATIVE DIAGNOSIS:  End-stage osteoarthritis, left hip.   PROCEDURE:  Left total hip replacement.   SURGEON:  Claude Manges. Cleophas Dunker, M.D.   ASSISTANT:  Jerolyn Shin. Tresa Res, M.D., Legrand Pitts. Duffy, P.A.   ANESTHESIA:  General orotracheal.   COMPLICATIONS:  None.   COMPLICATIONS:  DuPuy AML small stature 16.5 mm femoral component with a 32  mm hip ball and a +1 neck length, 56 mm outer diameter tri-spiked acetabular  component with a Marathon polyethylene liner with a 10 degrees lip.   DESCRIPTION OF PROCEDURE:  With the patient comfortable on the operating  table and under general orotracheal anesthesia, Foley catheter was inserted  by the nursing staff.  The patient was placed in the lateral decubitus  position with the left side up and secured to the operating table with the  Innomed hip system.  The left hip was then prepped with Betadine scrub and  then DuraPrep and the iliac crest below the knee, and sterile draping was  performed.   The patient was quite large, weighing in excess of 260 pounds, and the  procedure was technically difficult as a result of his obesity.   A routine southern incision was utilized and by sharp dissection, carried  down to the subcutaneous tissue. There was abundant adipose tissue which was  incised with the Bovie. Self-retaining retractors were inserted, as well as  an Government social research officer.  The iliotibial band was identified, incised along  the length of the skin incision. Again, retractors were  placed more deeply.  With the hip internally rotated, the short external rotators were  identified. Tendinous structures were tagged with 0-Tycron suture.  The  capsule was identified and incised.  There was a clear yellow joint effusion  and abundant synovitis.   The head was then dislocated. 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  Using the AML guide, the femora neck was then  osteotomized at a point about a fingerbreadths proximal to the lesser  trochanter. The head was then removed from the wound. The joint was  inspected, and there was abundant synovitis. A synovectomy was performed.   A starter hole was then made in the piriformis fossa.  Reaming was performed  to 16 mm to accept a 16.5 mm prosthesis.  Rasping was performed sequentially  to a 16.5 medium, modified aspect broach. We were probably 1 mm or so  prowed.   Retractors  were then placed about the acetabulum.  It was technically  difficult to visualize because of the depth of the wound, but the labrum was  excised sharply with a #15 blade knife. Retractors were placed about the  acetabulum. Reaming was performed to 53 mm, to accept a 54 mm outer diameter  component.  We trialed at 52 and a 54 mm component; we felt we had an  excellent limb fit.  Therefore, we inserted the 100 series acetabular  component, and we could never get it to seat securely, as it would rotate  and roll when we inserted the femoral component. At first, we thought that  maybe the acetabulum was ovoid and was not round or deep enough, so we re-  reamed to 53 diameter, and still the acetabular component would spin.  We  therefore trialed several larger components to the acetabulum, and the 56  did not completely seat, but still had good rim fit. We felt that he  probably had an expansile acetabulum.   The 56 mm outer diameter tri-spiked acetabular component was then impacted  in what we felt was neutral position.  The 10  degrees polyethylene trial  liner was then inserted, and we reinserted the 16.5 mm femoral component  with a 32 mm diameter head with a +1 neck length.  The leg lengths were  symmetric and excellent stability, and he was not tight in extension, and  also was stable in extension.  Accordingly, the trial components were removed. We impacted the acetabulum  further, inserted the apex hole eliminator, followed by the polyethylene  with the 10 degrees lip placed posteriorly.  At every step, we irrigated  with saline. The 16.5 femoral component was then impacted 7 mm prowed, but  it would not advance further. There was no crack in the calcar. We then  trialed the +1 neck with 32 mm head, again, and felt this was perfect  stability.   The trial head was removed, the [facility] taper neck was dried and the final 32  mm outer diameter head with the +1 neck length was then impacted. The hip  was then reduced with perfect stability up the acetabulum.  Leg lengths  appeared to be symmetrical.  The wound was irrigated again with saline  solution.  Capsule was closed anatomically with 0-Ethibond, the short  external rotators were closed with a similar material, the iliotibial band  closed with a running 0-Vicryl, subcutaneous in several layers with 0 and 2-  0 Vicryl, skin was closed with skin clips. Sterile, bulky dressing was  applied.  The patient was then placed supine on the operating room stretcher  and returned to the postanesthesia care unit in satisfactory condition.  A  knee immobilizer was applied.                                               Claude Manges. Cleophas Dunker, M.D.    PWW/MEDQ  D:  01/03/2004  T:  01/03/2004  Job:  [id number]

## 2010-10-05 NOTE — Procedures (Signed)
   NAME:  Sean [name], Sean [name]                          ACCOUNT NO.:  [id number]   MEDICAL RECORD NO.:  [id number]                   PATIENT TYPE:  PREC   LOCATION:                                       FACILITY:   PHYSICIAN:  Vida Roller, M.D.                DATE OF BIRTH:  02-04-1951   DATE OF PROCEDURE:  09/27/2002  DATE OF DISCHARGE:                                    STRESS TEST   ADENOSINE CARDIOLITE   INDICATION:  The patient is a 60 year old male with no known coronary artery  disease found to have an abnormal EKG preoperatively for lumbar surgery.  His cardiac risk factors include hypertension, hyperlipidemia, tobacco  abuse, and male sex.   BASELINE DATA:  EKG shows sinus rhythm at 72 beats per minute with inferior  T wave changes.  Blood pressure 142/82.   DESCRIPTION OF PROCEDURE:  Adenosine 60 mg was infused over four-minute  protocol with Cardiolite injected at three minutes.  The patient reported  chest tightness and shortness of breath which resolved in recovery.  EKG  showed no ischemic changes and no arrhythmias.  Maximum heart rate was 94  beats per minute.  Maximum blood pressure was 142/82.   Final images and results are pending M.D. review.     Amy Mercy Riding, P.A. [facility]                     Vida Roller, M.D.    AB/MEDQ  D:  09/27/2002  T:  09/27/2002  Job:  [id number]

## 2010-10-05 NOTE — Procedures (Signed)
   NAME:  Sean [name], Sean [name]                          ACCOUNT NO.:  [id number]   MEDICAL RECORD NO.:  [id number]                   PATIENT TYPE:  OUT   LOCATION:  DFTL                                 FACILITY:  [facility]   PHYSICIAN:  Edward L. Juanetta Gosling, M.D.             DATE OF BIRTH:  14-Jun-1950   DATE OF PROCEDURE:  DATE OF DISCHARGE:  02/19/2002                                    STRESS TEST   The patient came in the stress laboratory today at [facility] for a stress  test because of chest discomfort, but was found to have a very high blood  pressure 212/138 and the stress test was canceled.  He was instructed to go  by Dr. Renard Matter' office.  I gave him a note explaining his blood pressure,  etc.                                               Edward L. Juanetta Gosling, M.D.    ELH/MEDQ  D:  02/19/2002  T:  02/22/2002  Job:  [id number]   cc:   Angus G. Renard Matter, M.D.

## 2010-11-26 ENCOUNTER — Encounter: Payer: Self-pay | Admitting: Cardiology

## 2010-11-27 ENCOUNTER — Encounter: Payer: Self-pay | Admitting: *Deleted

## 2010-11-27 ENCOUNTER — Encounter: Payer: Self-pay | Admitting: Cardiology

## 2010-11-27 ENCOUNTER — Ambulatory Visit (INDEPENDENT_AMBULATORY_CARE_PROVIDER_SITE_OTHER): Payer: 59 | Admitting: Cardiology

## 2010-11-27 VITALS — BP 128/86 | HR 77 | Ht 73.0 in | Wt 285.0 lb

## 2010-11-27 DIAGNOSIS — F17200 Nicotine dependence, unspecified, uncomplicated: Secondary | ICD-10-CM

## 2010-11-27 DIAGNOSIS — I10 Essential (primary) hypertension: Secondary | ICD-10-CM

## 2010-11-27 DIAGNOSIS — R0989 Other specified symptoms and signs involving the circulatory and respiratory systems: Secondary | ICD-10-CM

## 2010-11-27 DIAGNOSIS — E785 Hyperlipidemia, unspecified: Secondary | ICD-10-CM

## 2010-11-27 DIAGNOSIS — M1990 Unspecified osteoarthritis, unspecified site: Secondary | ICD-10-CM

## 2010-11-27 MED ORDER — ASPIRIN EC 81 MG PO TBEC: 81.0000 mg | DELAYED_RELEASE_TABLET | Freq: Every day | ORAL | Status: AC

## 2010-11-27 NOTE — Assessment & Plan Note (Signed)
Blood pressure control is good; Patient attests that similar values are obtained at home.  Current medications will be continued.

## 2010-11-27 NOTE — Progress Notes (Signed)
HPI : Mr. Riches is a very pleasant 60 year old gentleman referred by Dr. Cleophas Dunker for preoperative evaluation prior to planned right TKA.  The same procedure was performed on the left side approximately 5 years ago without difficulty.  Patient has no serious chronic disease, but does have high vascular risk as a result of hypertension, hyperlipidemia, diabetes and extensive cigarette smoking.  He denies orthopnea, PND, chest discomfort, dyspnea, frequent episodes of bronchitis and any hospitalizations for pulmonary disease.  He has undergone bilateral cataract surgery since his most recent office visit, but has otherwise been well.  Unfortunately, he continues to smoke 2 packs of cigarettes per day and is not inclined to attempt to quit.  Current Outpatient Prescriptions on File Prior to Visit  Medication Sig Dispense Refill  . CARDURA 4 MG tablet TAKE (1) TABLET BY MOUTH DAILY.  30 each  6  . chlorthalidone (HYGROTON) 25 MG tablet Take 25 mg by mouth daily.        Marland Kitchen esomeprazole (NEXIUM) 40 MG capsule Take 40 mg by mouth daily.        Marland Kitchen glipiZIDE (GLUCOTROL) 10 MG tablet Take 10 mg by mouth daily.        Marland Kitchen HYDROcodone-acetaminophen (VICODIN) 5-500 MG per tablet Take 1 tablet by mouth as directed.        Marland Kitchen LOTREL 10-40 MG per capsule TAKE (1) CAPSULE BY MOUTHONCE DAILY.  30 each  5  . meloxicam (MOBIC) 15 MG tablet Take 15 mg by mouth daily.        . metFORMIN (GLUCOPHAGE) 500 MG tablet Take 500 mg by mouth 2 (two) times daily.        . metoprolol (TOPROL XL) 100 MG 24 hr tablet Take 100 mg by mouth daily.        . niacin (NIASPAN) 500 MG CR tablet Take 500 mg by mouth daily. Take 3 tabs       . DISCONTD: aspirin 325 MG tablet Take 81 mg by mouth daily.       Marland Kitchen DISCONTD: atorvastatin (LIPITOR) 40 MG tablet Take 40 mg by mouth at bedtime.          Allergies  Allergen Reactions  . Catapres (Clonidine Hydrochloride) Rash    Transdermal-skin eruption      Past medical history, social history, and  family history reviewed and updated.  ROS: See history of present illness.  PHYSICAL EXAM: BP 128/86  Pulse 77  Ht 6\' 1"  (1.854 m)  Wt 129.275 kg (285 lb)  BMI 37.60 kg/m2  SpO2 93%  General-Well developed; no acute distress Body habitus-obese Neck-No JVD; no carotid bruits Lungs-mild expiratory rhonchi;prolonged expiratory phase; resonant to percussion; breath sounds equal bilaterally Cardiovascular-normal PMI; normal S1 and S2 Abdomen-normal bowel sounds; soft and non-tender without masses or organomegaly Musculoskeletal-No deformities, no cyanosis or clubbing Neurologic-Normal cranial nerves; symmetric strength and tone Skin-Warm, no significant lesions Extremities-distal pulses intact; no edema  EKG: Normal sinus rhythm; T-wave abnormality, consider inferolateral ischemia or LVH; delayed R-wave progression; comparison with previous tracing performed 09/24/2002, T-wave abnormality is more impressive.  ASSESSMENT AND PLAN:

## 2010-11-27 NOTE — Assessment & Plan Note (Addendum)
Patient has no known cardiovascular disease or other significant chronic medical conditions that would directly adversely affect his surgical risk; however, he has considerable cardiovascular risk factors and progressive EKG abnormalities.  A pharmacologic stress echocardiogram will be performed to rule out high risk disease.  If negative, surgery can proceed.  Sean [name] is perturbed about the duration of his preoperative evaluation, continues to be in severe right hip pain and would like to proceed with surgery as soon as possible.

## 2010-11-27 NOTE — Assessment & Plan Note (Signed)
Discontinuation of tobacco use discussed with patient.  Despite his apparently understanding the implications of continued tobacco use, he is not inclined to attempt to quit at present.

## 2010-11-27 NOTE — Patient Instructions (Signed)
Your physician recommends that you schedule a follow-up appointment in: AFTER TESTS Your physician has requested that you have a stress echocardiogram. For further information please visit [email or web address] Please follow instruction sheet as given.  Your physician recommends that you return for lab work [id number]

## 2010-11-27 NOTE — Assessment & Plan Note (Addendum)
Control of hyperlipidemia is excellent; current medication will be continued.

## 2010-11-28 ENCOUNTER — Ambulatory Visit (HOSPITAL_COMMUNITY)
Admission: RE | Admit: 2010-11-28 | Discharge: 2010-11-28 | Disposition: A | Discharge status: Home / Self Care | Payer: 59 | Source: Ambulatory Visit | Attending: Cardiology | Admitting: Cardiology

## 2010-11-28 ENCOUNTER — Encounter (HOSPITAL_COMMUNITY): Payer: Self-pay | Admitting: Cardiology

## 2010-11-28 DIAGNOSIS — E119 Type 2 diabetes mellitus without complications: Secondary | ICD-10-CM | POA: Insufficient documentation

## 2010-11-28 DIAGNOSIS — Z01810 Encounter for preprocedural cardiovascular examination: Secondary | ICD-10-CM

## 2010-11-28 DIAGNOSIS — I10 Essential (primary) hypertension: Secondary | ICD-10-CM | POA: Insufficient documentation

## 2010-11-28 DIAGNOSIS — E785 Hyperlipidemia, unspecified: Secondary | ICD-10-CM

## 2010-11-28 DIAGNOSIS — F17200 Nicotine dependence, unspecified, uncomplicated: Secondary | ICD-10-CM | POA: Insufficient documentation

## 2010-11-28 DIAGNOSIS — R002 Palpitations: Secondary | ICD-10-CM

## 2010-11-28 LAB — COMPREHENSIVE METABOLIC PANEL
ALT: 22 U/L (ref 0–53)
Albumin: 4.6 g/dL (ref 3.5–5.2)
CO2: 27 mEq/L (ref 19–32)
Calcium: 9.8 mg/dL (ref 8.4–10.5)
Chloride: 99 mEq/L (ref 96–112)
Potassium: 3.3 mEq/L — ABNORMAL LOW (ref 3.5–5.3)
Sodium: 139 mEq/L (ref 135–145)
Total Bilirubin: 0.5 mg/dL (ref 0.3–1.2)
Total Protein: 6.8 g/dL (ref 6.0–8.3)

## 2010-11-28 LAB — CBC WITH DIFFERENTIAL/PLATELET
Basophils Absolute: 0 10*3/uL (ref 0.0–0.1)
Eosinophils Relative: 2 % (ref 0–5)
Lymphocytes Relative: 34 % (ref 12–46)
MCV: 92.7 fL (ref 78.0–100.0)
Neutrophils Relative %: 53 % (ref 43–77)
Platelets: 285 10*3/uL (ref 150–400)
RDW: 13.8 % (ref 11.5–15.5)
WBC: 10.6 10*3/uL — ABNORMAL HIGH (ref 4.0–10.5)

## 2010-11-28 LAB — PROTIME-INR: INR: 0.98 (ref <1.50)

## 2010-11-28 MED ORDER — DOBUTAMINE IN D5W 4-5 MG/ML-% IV SOLN
INTRAVENOUS | Status: AC
  Administered 2010-11-28: 10:00:00 via INTRAVENOUS
  Filled 2010-11-28: qty 250

## 2010-11-28 MED ORDER — ATROPINE SULFATE 0.1 MG/ML IJ SOLN
INTRAMUSCULAR | Status: AC
  Filled 2010-11-28: qty 10

## 2010-11-28 MED ORDER — SODIUM CHLORIDE 0.9 % IJ SOLN
INTRAMUSCULAR | Status: AC
  Administered 2010-11-28: 10:00:00
  Filled 2010-11-28: qty 10

## 2010-11-28 NOTE — Progress Notes (Signed)
  Stress Lab Nurses Notes - [facility]  CHRISTIAAN STREBECK 11/28/2010  Reason for doing test: Surgical Clearance  Type of test: Dobutamine Echo  Nurse performing test: Parke Poisson, RN  Nuclear Medicine Tech: Not Applicable  Echo Tech: Karrie Doffing  MD performing test: R. Dietrich Pates  Family MD: Cleophas Dunker  Test explained and consent signed: yes  IV started: 22g jelco  Symptoms: "Felt HR fast"  Treatment/Intervention: None  Reason test stopped: reached target HR  After recovery IV was: Discontinued  Patient to return to Nuc. Med at:   [facility].   Patient discharged: Home  Patient's Condition upon discharge was: stable  Comments: Symptoms resolved in recovery  Erskine Speed T

## 2010-11-30 ENCOUNTER — Encounter: Payer: Self-pay | Admitting: *Deleted

## 2010-12-04 ENCOUNTER — Encounter: Payer: Self-pay | Admitting: *Deleted

## 2010-12-07 ENCOUNTER — Encounter: Payer: Self-pay | Admitting: Cardiology

## 2010-12-07 ENCOUNTER — Telehealth: Payer: Self-pay | Admitting: *Deleted

## 2010-12-07 ENCOUNTER — Encounter: Payer: Self-pay | Admitting: *Deleted

## 2010-12-07 NOTE — Telephone Encounter (Signed)
Surgical clearance faxed on 11/29/10, refaxed today at 1700

## 2010-12-17 ENCOUNTER — Encounter: Payer: Self-pay | Admitting: *Deleted

## 2010-12-17 ENCOUNTER — Encounter: Payer: Self-pay | Admitting: Cardiology

## 2010-12-17 ENCOUNTER — Ambulatory Visit (INDEPENDENT_AMBULATORY_CARE_PROVIDER_SITE_OTHER): Payer: 59 | Admitting: Cardiology

## 2010-12-17 VITALS — BP 144/92 | HR 75 | Ht 73.0 in | Wt 291.0 lb

## 2010-12-17 DIAGNOSIS — E119 Type 2 diabetes mellitus without complications: Secondary | ICD-10-CM

## 2010-12-17 DIAGNOSIS — E785 Hyperlipidemia, unspecified: Secondary | ICD-10-CM

## 2010-12-17 DIAGNOSIS — E876 Hypokalemia: Secondary | ICD-10-CM

## 2010-12-17 DIAGNOSIS — I10 Essential (primary) hypertension: Secondary | ICD-10-CM

## 2010-12-17 DIAGNOSIS — M1990 Unspecified osteoarthritis, unspecified site: Secondary | ICD-10-CM

## 2010-12-17 DIAGNOSIS — F17200 Nicotine dependence, unspecified, uncomplicated: Secondary | ICD-10-CM

## 2010-12-17 NOTE — Assessment & Plan Note (Signed)
Lipid lowering therapy has been changed from atorvastatin to Vytorin and subsequently to a different dose of Vytorin.  A repeat lipid profile will be obtained.

## 2010-12-17 NOTE — Patient Instructions (Signed)
Your physician recommends that you schedule a follow-up appointment in: 1 year  Your physician recommends that you return for lab work in: 1 month     

## 2010-12-17 NOTE — Progress Notes (Signed)
HPI : [name] returns the office as scheduled for continued assessment and treatment prior to planned THR.  He has done well since his last visit with no chest discomfort, dyspnea, pedal edema or syncope.  On a pharmacologic stress echocardiogram, there was a borderline positive EKG response to stress, but no evidence for ischemia or infarction on the echocardiographic images, suggesting that the EKG findings represent a false positive.  Pulmonary history was reviewed.  Patient denies wheezing, and significant chronic obstructive pulmonary disease exacerbations, exertional dyspnea, chronic cough or sputum production.  Unfortunately, he continues to smoke 2 packs of cigarettes per day and does not see any reason for ceasing to do so.  Current Outpatient Prescriptions on File Prior to Visit  Medication Sig Dispense Refill  . aspirin EC 81 MG tablet Take 1 tablet (81 mg total) by mouth daily.  150 tablet  2  . CARDURA 4 MG tablet TAKE (1) TABLET BY MOUTH DAILY.  30 each  6  . chlorthalidone (HYGROTON) 25 MG tablet Take 25 mg by mouth daily.        . Choline Fenofibrate (TRILIPIX) 135 MG capsule Take 135 mg by mouth daily.        Marland Kitchen esomeprazole (NEXIUM) 40 MG capsule Take 40 mg by mouth daily.        Marland Kitchen glipiZIDE (GLUCOTROL) 10 MG tablet Take 10 mg by mouth daily.        Marland Kitchen LOTREL 10-40 MG per capsule TAKE (1) CAPSULE BY MOUTHONCE DAILY.  30 each  5  . meloxicam (MOBIC) 15 MG tablet Take 15 mg by mouth daily.        . metFORMIN (GLUCOPHAGE) 500 MG tablet Take 500 mg by mouth 2 (two) times daily.        . metoprolol (TOPROL XL) 100 MG 24 hr tablet Take 100 mg by mouth daily.        . niacin (NIASPAN) 500 MG CR tablet Take 500 mg by mouth daily. Take 3 tabs          Allergies  Allergen Reactions  . Catapres (Clonidine Hydrochloride) Rash    Transdermal-skin eruption      Past medical history, social history, and family history reviewed and updated.  ROS: See history of present illness  PHYSICAL  EXAM: BP 144/92  Pulse 75  Ht 6\' 1"  (1.854 m)  Wt 131.997 kg (291 lb)  BMI 38.39 kg/m2  SpO2 94%  General-Well developed; no acute distress Body habitus-obese Neck-No JVD; no carotid bruits Lungs-clear lung fields; resonant to percussion Cardiovascular-normal PMI; normal S1 and S2; minimal systolic murmur Abdomen-normal bowel sounds; soft and non-tender without masses or organomegaly Musculoskeletal-No deformities, no cyanosis or clubbing Neurologic-Normal cranial nerves; symmetric strength and tone Skin-Warm, no significant lesions Extremities-distal pulses intact; no edema  ASSESSMENT AND PLAN:

## 2010-12-17 NOTE — Assessment & Plan Note (Addendum)
Tobacco cessation discussed with patient at length.  He is not at all inclined to attempt to stop tobacco use, and I cannot identify a ploy that would result in him changing his mind.

## 2010-12-17 NOTE — Assessment & Plan Note (Addendum)
Patient tells me that surgery scheduled for 2 weeks hence.  In the absence of known coronary disease and with a negative stress echocardiogram, his surgical risk for the proposed procedure is quite acceptable.  No additional treatment or testing would significantly impact the magnitude of that risk.  [facility] will be available as needed in hospital.

## 2010-12-17 NOTE — Assessment & Plan Note (Signed)
No recent A1c values available; fasting glucose has consistently been less than 100.

## 2010-12-17 NOTE — Assessment & Plan Note (Signed)
We are attempting to treat mild hypokalemia with dietary measures.  I am not sanguine about the outcome of this trial.  Chemistry profile will be repeated in 1-2 months at which time supplemental potassium will likely be necessary.

## 2010-12-21 ENCOUNTER — Encounter: Payer: Self-pay | Admitting: *Deleted

## 2010-12-24 ENCOUNTER — Encounter (HOSPITAL_COMMUNITY)
Admission: RE | Admit: 2010-12-24 | Discharge: 2010-12-24 | Disposition: A | Discharge status: Home / Self Care | Payer: 59 | Source: Ambulatory Visit | Attending: Orthopaedic Surgery | Admitting: Orthopaedic Surgery

## 2010-12-24 LAB — URINALYSIS, ROUTINE W REFLEX MICROSCOPIC
Hgb urine dipstick: NEGATIVE
Ketones, ur: 15 mg/dL — AB
Nitrite: NEGATIVE
Protein, ur: NEGATIVE mg/dL
Specific Gravity, Urine: 1.028 (ref 1.005–1.030)
Urobilinogen, UA: 1 mg/dL (ref 0.0–1.0)

## 2010-12-24 LAB — APTT: aPTT: 27 seconds (ref 24–37)

## 2010-12-24 LAB — PROTIME-INR
INR: 0.91 (ref 0.00–1.49)
Prothrombin Time: 12.5 seconds (ref 11.6–15.2)

## 2010-12-24 LAB — DIFFERENTIAL
Basophils Relative: 1 % (ref 0–1)
Lymphs Abs: 4.1 10*3/uL — ABNORMAL HIGH (ref 0.7–4.0)
Monocytes Absolute: 1.2 10*3/uL — ABNORMAL HIGH (ref 0.1–1.0)
Monocytes Relative: 8 % (ref 3–12)
Neutro Abs: 8.9 10*3/uL — ABNORMAL HIGH (ref 1.7–7.7)

## 2010-12-24 LAB — URINE MICROSCOPIC-ADD ON

## 2010-12-24 LAB — SURGICAL PCR SCREEN: Staphylococcus aureus: NEGATIVE

## 2010-12-24 LAB — COMPREHENSIVE METABOLIC PANEL
ALT: 21 U/L (ref 0–53)
AST: 15 U/L (ref 0–37)
Alkaline Phosphatase: 59 U/L (ref 39–117)
CO2: 29 mEq/L (ref 19–32)
Calcium: 10.9 mg/dL — ABNORMAL HIGH (ref 8.4–10.5)
GFR calc Af Amer: >60 mL/min (ref >60)
GFR calc non Af Amer: 50 mL/min — ABNORMAL LOW (ref >60)
Glucose, Bld: 103 mg/dL — ABNORMAL HIGH (ref 70–99)
Potassium: 3.8 mEq/L (ref 3.5–5.1)
Sodium: 140 mEq/L (ref 135–145)

## 2010-12-24 LAB — CBC
HCT: 42.4 % (ref 39.0–52.0)
Hemoglobin: 14.8 g/dL (ref 13.0–17.0)
MCH: 32.3 pg (ref 26.0–34.0)
MCHC: 34.9 g/dL (ref 30.0–36.0)
MCV: 92.6 fL (ref 78.0–100.0)

## 2010-12-24 LAB — ABO/RH: ABO/RH(D): O POS

## 2010-12-25 LAB — URINE CULTURE

## 2010-12-26 ENCOUNTER — Encounter: Payer: Self-pay | Admitting: Cardiology

## 2011-01-01 ENCOUNTER — Inpatient Hospital Stay (HOSPITAL_COMMUNITY): Payer: 59

## 2011-01-01 ENCOUNTER — Inpatient Hospital Stay (HOSPITAL_COMMUNITY)
Admission: RE | Admit: 2011-01-01 | Discharge: 2011-01-04 | DRG: 470 | Disposition: A | Discharge status: Home Health | Payer: 59 | Source: Ambulatory Visit | Attending: Orthopaedic Surgery | Admitting: Orthopaedic Surgery

## 2011-01-01 DIAGNOSIS — F17200 Nicotine dependence, unspecified, uncomplicated: Secondary | ICD-10-CM | POA: Diagnosis present

## 2011-01-01 DIAGNOSIS — Z6841 Body Mass Index (BMI) 40.0 and over, adult: Secondary | ICD-10-CM

## 2011-01-01 DIAGNOSIS — D62 Acute posthemorrhagic anemia: Secondary | ICD-10-CM | POA: Diagnosis not present

## 2011-01-01 DIAGNOSIS — E119 Type 2 diabetes mellitus without complications: Secondary | ICD-10-CM | POA: Diagnosis present

## 2011-01-01 DIAGNOSIS — M1610 Unilateral primary osteoarthritis, unspecified hip: Principal | ICD-10-CM | POA: Diagnosis present

## 2011-01-01 DIAGNOSIS — M169 Osteoarthritis of hip, unspecified: Principal | ICD-10-CM | POA: Diagnosis present

## 2011-01-01 DIAGNOSIS — K219 Gastro-esophageal reflux disease without esophagitis: Secondary | ICD-10-CM | POA: Diagnosis present

## 2011-01-01 DIAGNOSIS — I10 Essential (primary) hypertension: Secondary | ICD-10-CM | POA: Diagnosis present

## 2011-01-01 DIAGNOSIS — E669 Obesity, unspecified: Secondary | ICD-10-CM | POA: Diagnosis present

## 2011-01-01 LAB — GLUCOSE, CAPILLARY
Glucose-Capillary: 178 mg/dL — ABNORMAL HIGH (ref 70–99)
Glucose-Capillary: 108 mg/dL — ABNORMAL HIGH (ref 70–99)
Glucose-Capillary: 126 mg/dL — ABNORMAL HIGH (ref 70–99)

## 2011-01-01 LAB — HEMOGLOBIN A1C
Hgb A1c MFr Bld: 7.4 % — ABNORMAL HIGH (ref <5.7)
Mean Plasma Glucose: 166 mg/dL — ABNORMAL HIGH (ref <117)

## 2011-01-02 ENCOUNTER — Inpatient Hospital Stay (HOSPITAL_COMMUNITY): Payer: 59

## 2011-01-02 LAB — CBC
HCT: 33.2 % — ABNORMAL LOW (ref 39.0–52.0)
Hemoglobin: 11 g/dL — ABNORMAL LOW (ref 13.0–17.0)
MCV: 93.5 fL (ref 78.0–100.0)
RBC: 3.55 MIL/uL — ABNORMAL LOW (ref 4.22–5.81)
WBC: 13.2 10*3/uL — ABNORMAL HIGH (ref 4.0–10.5)

## 2011-01-02 LAB — BASIC METABOLIC PANEL
BUN: 20 mg/dL (ref 6–23)
CO2: 32 mEq/L (ref 19–32)
Chloride: 102 mEq/L (ref 96–112)
Creatinine, Ser: 1.03 mg/dL (ref 0.50–1.35)
Glucose, Bld: 134 mg/dL — ABNORMAL HIGH (ref 70–99)

## 2011-01-02 LAB — GLUCOSE, CAPILLARY
Glucose-Capillary: 120 mg/dL — ABNORMAL HIGH (ref 70–99)
Glucose-Capillary: 121 mg/dL — ABNORMAL HIGH (ref 70–99)
Glucose-Capillary: 147 mg/dL — ABNORMAL HIGH (ref 70–99)

## 2011-01-03 ENCOUNTER — Inpatient Hospital Stay (HOSPITAL_COMMUNITY): Payer: 59

## 2011-01-03 LAB — GLUCOSE, CAPILLARY
Glucose-Capillary: 95 mg/dL (ref 70–99)
Glucose-Capillary: 136 mg/dL — ABNORMAL HIGH (ref 70–99)
Glucose-Capillary: 160 mg/dL — ABNORMAL HIGH (ref 70–99)
Glucose-Capillary: 89 mg/dL (ref 70–99)

## 2011-01-03 LAB — BASIC METABOLIC PANEL
CO2: 32 mEq/L (ref 19–32)
Chloride: 101 mEq/L (ref 96–112)
Creatinine, Ser: 1.09 mg/dL (ref 0.50–1.35)
GFR calc Af Amer: >60 mL/min (ref >60)
Potassium: 4.2 mEq/L (ref 3.5–5.1)
Sodium: 139 mEq/L (ref 135–145)

## 2011-01-03 LAB — CBC
MCH: 30.8 pg (ref 26.0–34.0)
MCV: 92 fL (ref 78.0–100.0)
Platelets: 199 10*3/uL (ref 150–400)
RBC: 3.38 MIL/uL — ABNORMAL LOW (ref 4.22–5.81)

## 2011-01-04 LAB — CROSSMATCH: Unit division: 0

## 2011-01-04 LAB — CBC
Hemoglobin: 10.3 g/dL — ABNORMAL LOW (ref 13.0–17.0)
MCH: 30.7 pg (ref 26.0–34.0)
MCHC: 33.1 g/dL (ref 30.0–36.0)
MCV: 92.8 fL (ref 78.0–100.0)
Platelets: 213 10*3/uL (ref 150–400)
RBC: 3.35 MIL/uL — ABNORMAL LOW (ref 4.22–5.81)

## 2011-01-04 LAB — BASIC METABOLIC PANEL
CO2: 33 mEq/L — ABNORMAL HIGH (ref 19–32)
Calcium: 9.4 mg/dL (ref 8.4–10.5)
Creatinine, Ser: 1.03 mg/dL (ref 0.50–1.35)
GFR calc non Af Amer: >60 mL/min (ref >60)
Glucose, Bld: 94 mg/dL (ref 70–99)
Sodium: 139 mEq/L (ref 135–145)

## 2011-01-23 ENCOUNTER — Ambulatory Visit (HOSPITAL_COMMUNITY)
Admission: RE | Admit: 2011-01-23 | Discharge: 2011-01-23 | Disposition: A | Discharge status: Home / Self Care | Payer: 59 | Source: Ambulatory Visit | Attending: Orthopaedic Surgery | Admitting: Orthopaedic Surgery

## 2011-01-23 DIAGNOSIS — M25659 Stiffness of unspecified hip, not elsewhere classified: Secondary | ICD-10-CM | POA: Insufficient documentation

## 2011-01-23 DIAGNOSIS — E119 Type 2 diabetes mellitus without complications: Secondary | ICD-10-CM | POA: Insufficient documentation

## 2011-01-23 DIAGNOSIS — M25559 Pain in unspecified hip: Secondary | ICD-10-CM | POA: Insufficient documentation

## 2011-01-23 DIAGNOSIS — R262 Difficulty in walking, not elsewhere classified: Secondary | ICD-10-CM | POA: Insufficient documentation

## 2011-01-23 DIAGNOSIS — E785 Hyperlipidemia, unspecified: Secondary | ICD-10-CM | POA: Insufficient documentation

## 2011-01-23 DIAGNOSIS — M25551 Pain in right hip: Secondary | ICD-10-CM | POA: Insufficient documentation

## 2011-01-23 DIAGNOSIS — M6281 Muscle weakness (generalized): Secondary | ICD-10-CM | POA: Insufficient documentation

## 2011-01-23 DIAGNOSIS — IMO0001 Reserved for inherently not codable concepts without codable children: Secondary | ICD-10-CM | POA: Insufficient documentation

## 2011-01-23 DIAGNOSIS — R29898 Other symptoms and signs involving the musculoskeletal system: Secondary | ICD-10-CM | POA: Insufficient documentation

## 2011-01-23 DIAGNOSIS — R269 Unspecified abnormalities of gait and mobility: Secondary | ICD-10-CM | POA: Insufficient documentation

## 2011-01-23 DIAGNOSIS — I10 Essential (primary) hypertension: Secondary | ICD-10-CM | POA: Insufficient documentation

## 2011-01-23 NOTE — Op Note (Signed)
NAMECARTRELL, BENTSEN                ACCOUNT NO.:  [id number]  MEDICAL RECORD NO.:  [id number]  LOCATION:  2550                         FACILITY:  [facility]  PHYSICIAN:  Claude Manges. Shepard Keltz, M.D.DATE OF BIRTH:  12/16/1950  DATE OF PROCEDURE:  01/01/2011 DATE OF DISCHARGE:                              OPERATIVE REPORT   PREOPERATIVE DIAGNOSES: 1. End-stage osteoarthritis, right hip. 2. Obesity (weight 285).  POSTOPERATIVE DIAGNOSES: 1. End-stage osteoarthritis, right hip. 2. Obesity (weight 285).  PROCEDURE:  Right total hip replacement.  SURGEON:  Claude Manges. Cleophas Dunker, MD  ASSISTANT:  Oris Drone. Petrarca, PA-C  ANESTHESIA:  General.  COMPLICATIONS:  None.  COMPONENTS:  DePuy AML small stature 16.5 mm five-eighths pore coated femoral component, a 36-mm outer diameter hip ball with an 8.5 mm neck length, 56-mm outer diameter Gription acetabular shell, apex hole eliminator in a pinnacle marathon polyethylene liner with +4 with a 10- degrees posterior lip.  Components were Press-Fit.  PROCEDURE:  Mr. Brow was met in the holding area, identified as the right hip was the appropriate operative site.  He was then transported to room #1 and placed under general orotracheal anesthesia.  Nursing staff inserted a Foley catheter.  Urine was clear.  The patient was then placed in the lateral decubitus position with the right side up and secured to the operating room table with the innominate hip system.  The right hip was then prepped with Betadine scrub and DuraPrep from iliac crest to below the knee.  Sterile draping was performed.  A routine southern incision was utilized via sharp dissection carried down to the subcutaneous tissue.  Gross bleeders were Bovie coagulated. Mr. Wangerin is a large man with a BMI over 40.  There was abundant adipose tissue.  The procedure was technically difficult because of his size. There was approximately 4 inches of adipose tissue before encountering the  iliotibial band.  This was incised.  Self-retaining retractors were inserted.  There was still abundant adipose tissue that was just manually freed from the overlying greater trochanter with retractor was inserted, the short external rotators were identified, they were carefully incised.  Tendinous structures were tagged with 0-Ethibond suture.  The capsule was identified, incised along the femoral neck and head.  The joint was then entered, the hip was easily dislocated posteriorly.  Approximately 75% of head of the head was devoid of articular cartilage.  Using the AML calcar guide, an osteotomy was made approximately a fingerbreadth proximal to the lesser trochanter.  The head was then delivered from the wound.  Trial retractor was then placed along the femoral neck.  A center hole was then made.  The canal finder was then inserted.  Reaming was performed to 16 mm to accept a 16.5 femoral component.  Rasping was then performed to 16.5 mm.  It had a nice fit.  He has a calcar reamer to obtain a flush fit on the calcar.  Retractors were then placed around the acetabulum.  The acetabulum was quite deep and difficult to visualize.  I used one of the wing retractors.  There was prominence of the greater trochanter, which also partially obliterated the joint.  I sharply excised  the labrum.  At that point, we could visualize the acetabulum.  Reaming was performed sequentially to 55 mm to accept a 56-mm component.  I had nice bleeding bone, I thought I had nice symmetrical reaming.  I had tried a 54 mm cuff and it had a very nice rim fit, it would completely seat, 56 would not completely seat, it was nice and tight.  I elected to use the Gription 56-mm outer diameter sector component. This was impacted into the acetabulum, it was a very nice fit and nice and tight.  Screws were not necessary.  The trial polyethylene liner was then inserted.  We reinserted the 16.5-mm small stature rasp.  We  trialed a +5 and then a +8.5 mm neck length of 36 mm outer diameter hip ball.  The hip was barely subluxable at the extremes of flexion, internal rotation with a +5 ball with a +8.5 neck length.  There was perfect stability, so we elected to proceed with the 8.5 hip ball.  The trial components were removed.  The acetabulum was closed and loosely irrigated with saline solution.  The apex hole eliminator was inserted followed by the final marathon polyethylene liner.  The 16.5 small stature AML femoral component was then impacted on the calcar at approximately 15-20 degrees of anteversion.  I again tried to a 5 and 8.5 mm neck length and felt that the 8.5 was more stable.  The Morse taper neck was cleaned, the final hip ball was then applied with a 36-mm odd diameter 8.5 neck length.  We cleaned the acetabulum for any soft tissue and then reduced the hip through a full range of motion with excellent stability.  Wound was then irrigated with saline solution. The capsule closed anatomically with #1 Ethibond.  Short external rotator was closed with a similar material.  We closed the iliotibial band with a running #1 Vicryl, the subcu was closed with number of layers with 0, 2-0 Vicryl, and 3-0 Monocryl.  Skin was closed with skin clips.  Sterile bulky dressing was applied.  The patient tolerated the procedure without complications.     Claude Manges. Cleophas Dunker, M.D.     PWW/MEDQ  D:  01/01/2011  T:  01/01/2011  Job:  [id number]  Electronically Signed by Norlene Campbell M.D. on 01/23/2011 02:05:43 PM

## 2011-01-23 NOTE — Patient Instructions (Addendum)
HEP

## 2011-01-23 NOTE — Discharge Summary (Signed)
Sean, [name] NO.:  [id number]  MEDICAL RECORD NO.:  [id number]  LOCATION:  5032                         FACILITY:  [facility]  PHYSICIAN:  Claude Manges. Whitfield, M.D.DATE OF BIRTH:  [birth year]  DATE OF ADMISSION:  01/01/2011 DATE OF DISCHARGE:                        DISCHARGE SUMMARY - REFERRING   ADMISSION DIAGNOSIS:  Osteoarthritis of the right hip.  DISCHARGE DIAGNOSES: 1. Osteoarthritis of the right hip. 2. Obesity. 3. Status post left total hip arthroplasty. 4. History of hypertension. 5. Cigarette smoker. 6. Non-insulin-dependent diabetes mellitus. 7. Elevated white count. 8. Hypokalemia. 9. Acute blood loss anemia.  PROCEDURE:  Right total hip arthroplasty.  HISTORY:  [name] is a 60 year old African American male with right hip and buttock pain.  He had developed pain in the lateral hip and buttock and groin area, which is graded as severe throbbing and sharp pain.  It is worsening and will wake him.  He is status post left total hip arthroplasty with good results.  Has had evaluation by Neurosurgery, but once a right total hip arthroplasty before any back surgery.  He has radiographic end-stage OA of the right hip with  periarticular cysts. Indicated now for right total hip arthroplasty.  HOSPITAL COURSE:  A 60 year old [place] American male, admitted date January 01, 2011.  After appropriate laboratory studies were obtained and at 2 grams of Ancef IV on-call to the operating room, he was taken to the operating room where he underwent a right total hip arthroplasty by Dr. Norlene Campbell, assisted by Oris Drone. Petrarca, PA-C.  This involved a DePuy AML small stature, 16.5-mm pore-coated femoral component, a 36- mm outer diameter hip ball with an 8.5-mm neck.  A 56-mm outer diameter Gription acetabular shell with an apex hole eliminator and a pinnacle marathon polyethylene liner of +4 with a 10-degree posterior lip.  All components were  Press-Fit.  He tolerated the procedure well.  He was placed on a Dilaudid full-dose PCA pump for pain management.  Placed on diabetic diet.  Started on Xarelto 10 mg at 10:00 p.m. on the night of his surgery.  IV Tylenol was given 1 gram intraoperatively and then IV q.6 h x4 doses, then 1 gram p.o. q.6 h p.o.  Foley was placed intraoperatively.  CONSULT:  Physical therapy was ordered for weightbearing as tolerated. His metformin was held until January 02, 2011 PM dose.  AP pelvis and right hip was ordered, which showed excellent position alignment of the prosthesis.  No fractures noted.  He was allowed out of bed to chair the following day.  He was weaned off his PCA and his O2.  Respiratory therapy was ordered for good pulmonary toilet.  Incentive spirometry q.1 h.  Chest x-ray was ordered to rule out atelectasis/pneumonia.  This revealed increased patchy bibasilar opacities, which are consistent with atelectasis and overlying soft tissue.  Early pneumonia could not be ruled out.  Postop day 2, his dressing was changed.  FL-2 was requested as well as a social service for SNF placement because of not knowing that if he could obtain help at home.  This had been discussed in the office previously, however, he told us that he did  have help but now a days in the hospital, he has changed his mind.  Postop day 3, his dressing was changed again.  His chest x-ray of August 16 did reveal overall improved lung aeration with better lung volumes.  Persistent areas of subsegmental atelectasis to the right lower lobe.  The remainder of his hospital course was uneventful.  He did have hypokalemia and was given 40 mEq of KCl.  At this time, he is now indicated for discharge to either the SNF or to his home depending upon his arrangements today.  He was discharged in improved condition.  LABORATORY STUDIES:  He was admitted with a hemoglobin 14.8, hematocrit 42.4, white count was 14,600, platelets was  259,000.  Discharge hemoglobin 10.3, hematocrit 31.1%, white count 13,300, platelets was 213,000.  Preop sodium was 140, potassium 3.8, chloride 101, CO2 29, glucose 103, BUN 33, creatinine 1.45, GFR was greater than 60, bilirubin 0.2, alk phos 59, SGOT 15, SGPT 21, total protein 7.1, albumin was 4.2, calcium was 10.9.  Discharge sodium 139, potassium 3.2, chloride 98, CO2 33, glucose 94, BUN 17, creatinine 1.03, GFR greater than 60, calcium 9.4. Hemoglobin A1c on August 14 was 7.4 with mean glucose of 166. Urinalysis revealed 0-2 whites, 0-2 reds.  He has hyaline casts noted. Rare squamous, trace nitrites.  Few bacteria were noted.  Blood type was O+.  MRSA and Staph swabs of the nasal passages revealed no organisms. His urine culture showed no growth.  Radiographic studies were noted above for the chest x-ray.  As for his hip on January 01, 2011, it was noted to have femoral components appear well seated.  No acute fracture noted.  DISCHARGE INSTRUCTIONS:  He is to be on a diabetic diet at home. Increase his activity slowly.  No lifting or driving for 6 weeks.  He may shower without dressing once there is no drainage.  Do not wash over the wound.  If drainage remains, cover the wound with a plastic wrap and then shower.  He is allowed to be full weightbearing as taught in physical therapy using a walker, crutches as instructed.  Use TED hose for 3 weeks on the right leg.  He is maybe removed for sleeping.  He is to call for an appointment, to be seen on August 27 for followup of staple removal.  He is to follow up with hip precautions as taught in physical therapy.  He can change his dressing on Saturday and then change dressing daily with sterile 4x4 inch gauze dressings and paper tape.  May clean the incision with alcohol prior to redressing.  His med rec sheet is on the chart.  Medications have been written to include oxycodone, methocarbamol, and Xarelto.  He was discharged in  improved condition.     Oris Drone Petrarca, P.A.-C.   ______________________________ Claude Manges. Cleophas Dunker, M.D.    BDP/MEDQ  D:  01/04/2011  T:  01/04/2011  Job:  [id number]  Electronically Signed by Jacqualine Code P.A.-C. on 01/08/2011 12:18:00 PM Electronically Signed by Norlene Campbell M.D. on 01/23/2011 02:05:41 PM

## 2011-01-23 NOTE — Progress Notes (Signed)
Physical Therapy Evaluation  Patient Details  Name: Sean [name] MRN: [id number] Date of Birth: 02-Mar-1951  Today's Date: 01/23/2011 Time: 1115-1201 Time Calculation (min): 46 min Visit#: 1 of 12 Re-eval: 02/22/11  Past Medical History:  Past Medical History  Diagnosis Date  . HTN (hypertension)   . HLD (hyperlipidemia)   . Tobacco abuse     70 pack years  . Abnormal EKG     negative stress nuclear-2005; probable inferolateral scarring in 2007- negative EKG at 10 mets   . Diabetes mellitus     AODM- no insulin  . GERD (gastroesophageal reflux disease)   . 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 Obesity    Past Surgical History:  Past Surgical History  Procedure Date  . Total hip arthroplasty 2005    Left  . Hernia repair 1997  . Lumbar spine surgery 2004    Laminectomy and fusion  . Cataract extraction 2011-12    Bilateral    Subjective Symptoms/Limitations Symptoms: Pt states he had a total hip replacement on 01/01/11.  He recieved home health for two weeks.  He is currently having difficulty walking and has decreased strength and is therefore being referred to therapy. How long can you sit comfortably?: Pt states that he has to move around due to sitting being uncomfortable. How long can you stand comfortably?: Pt states he has not been doing this How long can you walk comfortably?: Pt is able to walk with a cane for less than five minutes. Special Tests: Pt has to complete steps one at a time. Pain Assessment Currently in Pain?: Yes Pain Score:  (Pt states the worst has been up to a 6 in the past week.) Pain Location: Hip (waking up 2-3 times a night) Pain Orientation: Right Pain Type: Acute pain Pain Onset: 1 to 4 weeks ago Pain Frequency: Intermittent Pain Relieving Factors: rest Multiple Pain Sites: No  Precautions/Restrictions  Precautions Precautions: Posterior Hip Restrictions Weight Bearing Restrictions: No  Prior  Functioning  Home Living Type of Home: House Lives With: Alone;Son Neffs Help From: Friend(s) Home Layout: One level Home Access: Stairs to enter Entrance Stairs-Rails: Right Entrance Stairs-Number of Steps:  (3) Bathroom Shower/Tub: Engineer, manufacturing systems: Standard Prior Function Level of Independence: Independent with basic ADLs Driving: Yes Able to Take Stairs Reciprically: Yes Vocation: Full time employment Leisure: Hobbies-no  Cognition Cognition Overall Cognitive Status: Appears within functional limits for tasks assessed Arousal/Alertness: Awake/alert Orientation Level: Oriented X4  Sensation/Coordination/Flexibility    Assessment RLE Assessment RLE Assessment: Exceptions to [facility] RLE AROM (degrees) Right Hip Flexion 0-125:  (to 90 degree per precaution) Right Hip ABduction 0-45:  (45) Right Knee Extension 0-130: 0  Right Knee Flexion 0-140: 130  Right Ankle Dorsiflexion 0-20: 20  RLE Strength Right Hip Flexion: 3/5 Right Hip Extension: 4/5 Right Hip ABduction: 2+/5 Right Knee Flexion: 4/5 Right Knee Extension: 5/5 Right Ankle Dorsiflexion: 5/5 LLE Assessment LLE Assessment: Within Functional Limits  Mobility (including Balance) Ambulation/Gait Ambulation/Gait: Yes Ambulation/Gait Assistance: 6: Modified independent (Device/Increase time) Ambulation Distance (Feet): 100 Feet Assistive device: Straight cane Gait Pattern: Decreased step length - left;Decreased stance time - right;Trendelenburg     Exercise/Treatments Stretches   Hip Exercises Straight Leg Raises: AROM;Right;5 reps Hip ABduction/ADduction: AROM;Strengthening;Right;10 reps;Supine;Sidelying Bridges:  (10)  SLS:  (B- L max 4"  R max 2")     Physical Therapy Assessment and Plan PT Assessment and Plan Clinical Impression Statement: Pt is s/p  R THR with decreased hip abductor, extensor and flexor strength, decreased balance and trendelenburg gait who will  benefit from  skilled therapy to return pt to prior functional level. Rehab Potential: Good PT Frequency: Min 3X/week PT Treatment/Interventions: Gait training;Therapeutic exercise;Balance training PT Plan: see 3x/week for strengthening, balance and gait training.  Begin functional squats, standing extension, heel raises, isometric adduction and TM next treatment.    Goals PT Short Term Goals Time to Complete Goals: 2 weeks PT Short Term Goal 1: I HEP PT Short Term Goal 2: mm increased 1/2 grade to allow pt to ambulate for 15 minutes without fatigue. PT Short Term Goal 3: Pt to normalize gait to equal stride length and keeping R foot in line with hiip and not lateral to hipl PT Short Term Goal 4: Pain level to be no greater than a 3 to allow pt to wake only one time a night.  Currently waking 2-3 times a night PT Long Term Goals PT Long Term Goal 1: I advance HEP-3 wk PT Long Term Goal 2: strength to be increased one grade to allow pt to ambulate with normal gait -4wk Long Term Goal 3: Pain level to be decreased by 5 levels to allow pt to sleep throughout the night. 4 wk  Long Term Goal 4: RTW- 4wk  Problem List Patient Active Problem List  Diagnoses  . Diabetes mellitus type II  . HYPERLIPIDEMIA  . OBESITY  . TOBACCO ABUSE  . HYPERTENSION  . DJD (degenerative joint disease)  . Hypokalemia  . Abnormality of gait  . Difficulty in walking  . Weakness of right leg  . Pain in right hip    PT - End of Session Activity Tolerance: Patient tolerated treatment well General Behavior During Session: [facility] for tasks performed Cognition: [facility] for tasks performed   RUSSELL,CINDY 01/23/2011, 12:16 PM  Physician Documentation Your signature is required to indicate approval of the treatment plan as stated above.  Please sign and either send electronically or make a copy of this report for your files and return this physician signed original.   Please mark one 1.__approve of plan  2. ___approve of plan  with the following conditions.   ______________________________                                                          _____________________ Physician Signature                                                                                                             Date

## 2011-01-24 ENCOUNTER — Ambulatory Visit (HOSPITAL_COMMUNITY)
Admission: RE | Admit: 2011-01-24 | Discharge: 2011-01-24 | Disposition: A | Discharge status: Home / Self Care | Payer: 59 | Source: Ambulatory Visit | Attending: Orthopaedic Surgery | Admitting: Orthopaedic Surgery

## 2011-01-24 DIAGNOSIS — R29898 Other symptoms and signs involving the musculoskeletal system: Secondary | ICD-10-CM

## 2011-01-24 DIAGNOSIS — R262 Difficulty in walking, not elsewhere classified: Secondary | ICD-10-CM

## 2011-01-24 DIAGNOSIS — M25551 Pain in right hip: Secondary | ICD-10-CM

## 2011-01-24 DIAGNOSIS — R269 Unspecified abnormalities of gait and mobility: Secondary | ICD-10-CM

## 2011-01-24 NOTE — Progress Notes (Signed)
Physical Therapy Treatment Patient Details  Name: Sean [name] MRN: [id number] Date of Birth: 1950/08/02  Today's Date: 01/24/2011 Time: 4098-1191 Time Calculation (min): 41 min Visit#: 2 of 12 Re-eval: 02/22/11 Charge: therex 31 min Gait 5 min  Subjective: Symptoms/Limitations Symptoms: No pain today, had pain meds @ 2:00 this afternoon.  Pt with trendelenburg gait. Pain Assessment Currently in Pain?: No/denies  Objective: pt with forward flexed lumbar posture  Exercise/Treatments   Weight shifting R and L LE Gait training to reduce trendelenburg gait TM 5' @ 1.5 Standing: Functional squats 10 x Lumbar extension for posture 5x 5" Hip extension 10x SLS: (B- L max 5" R max 4") Heel raises 10 x Supine:  SLR 10x Bridge 10x Iso adduction with ball 10x 5"  Physical Therapy Assessment and Plan PT Assessment and Plan Clinical Impression Statement: Pt with trendelendburg gait, gait training to reduce with vc for equal stance phase. Weight shifting to increase R LE WB. Pt required continous vc for posture, to reduce forward lumbar flexion. Pt tolerated well towards total treatment, performed all ex correctly with vc for posture, form and tech PT Plan: Continue with current POC, add steps and progress balance next session    Goals    Problem List Patient Active Problem List  Diagnoses  . Diabetes mellitus type II  . HYPERLIPIDEMIA  . OBESITY  . TOBACCO ABUSE  . HYPERTENSION  . DJD (degenerative joint disease)  . Hypokalemia  . Abnormality of gait  . Difficulty in walking  . Weakness of right leg  . Pain in right hip    PT - End of Session Activity Tolerance: Patient tolerated treatment well General Behavior During Session: [facility] for tasks performed Cognition: [facility] for tasks performed  Juel Burrow 01/24/2011, 7:15 PM

## 2011-01-24 NOTE — Progress Notes (Deleted)
Physical Therapy Treatment Patient Details  Name: Sean [name] MRN: [id number] Date of Birth: 1951/04/24  Today's Date: 01/24/2011 Time: 4098-1191 Time Calculation (min): 38 min Visit#: 2 of 12 Re-eval: 02/22/11 Charge: therex 31 min Gait 5 min  Subjective: Symptoms/Limitations Symptoms: No pain today, had pain meds @ 2:00 this afternoon.  Pt with trendelenburg gait. Pain Assessment Currently in Pain?: No/denies  Objective: pt with forward lumbar flexed posture.  Exercise/Treatments  Weight shifting R and L LE Gait training to reduce trendelenburg gait TM 5' @ 1.5 Standing: Functional squats 10 x Lumbar extension for posture 5x 5" Hip extension 10x SLS: (B- L max 5" R max 4") Heel raises 10 x Supine:  SLR 10x Bridge 10x Iso adduction with ball 10x 5"  Physical Therapy Assessment and Plan PT Assessment and Plan Clinical Impression Statement: Pt with trendelendburg gait, gait training to reduce with vc for equal stance phase.  Weight shifting to increase R LE WB.  Pt required continous vc for posture, to reduce forward lumbar flexion.  Pt tolerated well towards total treatment, performed all ex correctly with vc for posture, form and tech. PT Plan: Continue with current POC, add steps and progress balance next session.    Goals    Problem List Patient Active Problem List  Diagnoses  . Diabetes mellitus type II  . HYPERLIPIDEMIA  . OBESITY  . TOBACCO ABUSE  . HYPERTENSION  . DJD (degenerative joint disease)  . Hypokalemia  . Abnormality of gait  . Difficulty in walking  . Weakness of right leg  . Pain in right hip    PT - End of Session Activity Tolerance: Patient tolerated treatment well General Behavior During Session: [facility] for tasks performed Cognition: [facility] for tasks performed  Juel Burrow 01/24/2011, 7:10 PM

## 2011-01-28 ENCOUNTER — Ambulatory Visit (HOSPITAL_COMMUNITY)
Admission: RE | Admit: 2011-01-28 | Discharge: 2011-01-28 | Disposition: A | Discharge status: Home / Self Care | Payer: 59 | Source: Ambulatory Visit | Attending: *Deleted | Admitting: *Deleted

## 2011-01-28 NOTE — Progress Notes (Addendum)
Physical Therapy Treatment Patient Details  Name: Sean [name] MRN: [id number] Date of Birth: [birth year]  Today's Date: 01/28/2011 Time: 1353-1430 Time Calculation (min): 37 min Visit#: 3 of 12 Re-eval: 02/22/11 Charges: Gait x 8' Therex x 25'  Subjective: Symptoms/Limitations Symptoms: I feel okay right now. Pain Assessment Currently in Pain?: No/denies   Exercise/Treatments  Hip Exercises Straight Leg Raises: 10 reps;Right Hip ABduction/ADduction: Strengthening;10 reps Hip Abduction/Adduction Limitations: adduction isometric with ball (not past midline) Bridges: 10 reps;Supine  Mini-Sqauts: 10 reps SLS: L 5"; R 9" Tread Mill: 5' @ 1.2 Weight shifting R and L LE  Gait training to reduce trendelenburg gait (on tread mill) Standing:  Hip flexion R x 10 3# (not past 90 degrees) Hip extension 10x  SLS: (B- L max 5" R max 9")  Heel raises 10 x  Physical Therapy Assessment and Plan PT Assessment and Plan Clinical Impression Statement: Pt with R circumduction with gait secondary to decreased hip flexor strength. Began standing hip flexion with 3# wt (not past 90 degrees) to increase hip flexor strength. Pt with decreased stance time on R with gait. PT Treatment/Interventions: Therapeutic exercise;Other (comment) (Gait training) PT Plan: Continue to progress per PT POC.     Problem List Patient Active Problem List  Diagnoses  . Diabetes mellitus type II  . HYPERLIPIDEMIA  . OBESITY  . TOBACCO ABUSE  . HYPERTENSION  . DJD (degenerative joint disease)  . Hypokalemia  . Abnormality of gait  . Difficulty in walking  . Weakness of right leg  . Pain in right hip    PT - End of Session Activity Tolerance: Patient tolerated treatment well General Behavior During Session: [facility] for tasks performed Cognition: [facility] for tasks performed  Antonieta Iba 01/28/2011, 2:48 PM

## 2011-01-30 ENCOUNTER — Ambulatory Visit (HOSPITAL_COMMUNITY)
Admission: RE | Admit: 2011-01-30 | Discharge: 2011-01-30 | Disposition: A | Discharge status: Home / Self Care | Payer: 59 | Source: Ambulatory Visit | Attending: Orthopaedic Surgery | Admitting: Orthopaedic Surgery

## 2011-01-30 NOTE — Progress Notes (Signed)
Physical Therapy Treatment Patient Details  Name: Sean [name] MRN: [id number] Date of Birth: 30-Jul-1950  Today's Date: 01/30/2011 Time: 4098-1191 Time Calculation (min): 41 min Visit#: 4 of 12 Re-eval: 02/22/11 Charges:  Gait 8', therex 26'  Subjective: Symptoms/Limitations Symptoms: Pt. reports more stiffness than pain today.  States 4-5/10 this morning and didn't take his pain pill either. Pain Assessment Currently in Pain?: Yes Pain Score:   5 Pain Location: Hip Pain Orientation: Right Multiple Pain Sites: Yes  Precautions/Restrictions :  S/P bilateral hip replacements with Posterior hip precautions   Exercise/Treatments Gait: Treadmill 5' @ 1.2 to reduce trendelenburg gait Standing:  Hip flexion R x 15 3# (not past 90 degrees)  Hip extension 15x  SLS: (B- L max 4" R max 5")  Vector stance on Right 3X10"  Heel raises 15 x  Mini squats 15X Supine:  Straight Leg Raises: 15 reps  Hip Adduction isometric with ball (not past midline)  Bridges: 15 reps Sidelying: Hip Abduction 10X each, AA with R LE, pillow between knees     Physical Therapy Assessment and Plan PT Assessment and Plan Clinical Impression Statement: Added vector stance with R LE to increase hip stability.  Able to increase reps today without difficulty. PT Treatment/Interventions: Therapeutic exercise;Gait training PT Plan: Continue to progress.  Attempt to increase speed on treadmill next visit.       Problem List Patient Active Problem List  Diagnoses  . Diabetes mellitus type II  . HYPERLIPIDEMIA  . OBESITY  . TOBACCO ABUSE  . HYPERTENSION  . DJD (degenerative joint disease)  . Hypokalemia  . Abnormality of gait  . Difficulty in walking  . Weakness of right leg  . Pain in right hip    PT - End of Session Activity Tolerance: Patient tolerated treatment well General Behavior During Session: [facility] for tasks performed Cognition: [facility] for tasks performed  Bascom Levels, Kyre Jeffries B 01/30/2011, 11:01  AM

## 2011-01-31 ENCOUNTER — Other Ambulatory Visit: Payer: Self-pay | Admitting: Cardiology

## 2011-02-01 ENCOUNTER — Ambulatory Visit (HOSPITAL_COMMUNITY)
Admission: RE | Admit: 2011-02-01 | Discharge: 2011-02-01 | Disposition: A | Discharge status: Home / Self Care | Payer: 59 | Source: Ambulatory Visit

## 2011-02-01 DIAGNOSIS — M25551 Pain in right hip: Secondary | ICD-10-CM

## 2011-02-01 DIAGNOSIS — R262 Difficulty in walking, not elsewhere classified: Secondary | ICD-10-CM

## 2011-02-01 DIAGNOSIS — R29898 Other symptoms and signs involving the musculoskeletal system: Secondary | ICD-10-CM

## 2011-02-01 DIAGNOSIS — R269 Unspecified abnormalities of gait and mobility: Secondary | ICD-10-CM

## 2011-02-01 NOTE — Progress Notes (Signed)
Physical Therapy Treatment Patient Details  Name: Sean [name] MRN: [id number] Date of Birth: November 25, 1950  Today's Date: 02/01/2011 Time: 1022-1100 Time Calculation (min): 38 min Visit#: 5 of 12 Re-eval: 02/22/11  Charge: therex: 23 Gait 8  Subjective: Symptoms/Limitations Symptoms: Pt stated no pain, taken pain meds this meds before session. Pain Assessment Currently in Pain?: No/denies  Precautions/Restrictions     Mobility (including Balance)       Exercise/Treatments  Gait:  Treadmill 5' @ 1.5 vc-ing required to reduce trendelenburg gait  Standing:  March (hip flexion) R 2x 10 3# (not past 90 degrees)  Hip abduction 15x Hip extension 15x  SLS: (B- L max 2" R max 5")  Heel raises 2x 10  Mini squats 15X  Supine:  Straight Leg Raises: 15 reps  Hip Adduction isometric with ball (not past midline) 15x 5" Bridges: 15 reps  Sidelying:  Hip Abduction 10X each, AA with R LE, pillow between knees  Sitting:  Hip abduction with blue tband 10x 2 sets  Physical Therapy Assessment and Plan PT Assessment and Plan Clinical Impression Statement: Increased speed on TM multimodal cueing required for posture and equal stance phase to reduce the trendelenburg gait. Added seated abduction with tband for increased hip abd strength, pt able to perform without diff PT Plan: Continue with current POC    Goals    Problem List Patient Active Problem List  Diagnoses  . Diabetes mellitus type II  . HYPERLIPIDEMIA  . OBESITY  . TOBACCO ABUSE  . HYPERTENSION  . DJD (degenerative joint disease)  . Hypokalemia  . Abnormality of gait  . Difficulty in walking  . Weakness of right leg  . Pain in right hip    PT - End of Session Activity Tolerance: Patient tolerated treatment well General Behavior During Session: [facility] for tasks performed Cognition: [facility] for tasks performed  Juel Burrow 02/01/2011, 6:29 PM

## 2011-02-04 ENCOUNTER — Inpatient Hospital Stay (HOSPITAL_COMMUNITY): Admission: RE | Admit: 2011-02-04 | Payer: 59 | Source: Ambulatory Visit | Admitting: *Deleted

## 2011-02-06 ENCOUNTER — Ambulatory Visit (HOSPITAL_COMMUNITY): Payer: 59 | Admitting: Physical Therapy

## 2011-02-08 ENCOUNTER — Ambulatory Visit (HOSPITAL_COMMUNITY): Payer: 59 | Admitting: Physical Therapy

## 2011-02-11 ENCOUNTER — Ambulatory Visit (HOSPITAL_COMMUNITY): Payer: 59 | Admitting: *Deleted

## 2011-02-13 ENCOUNTER — Ambulatory Visit (HOSPITAL_COMMUNITY): Payer: 59 | Admitting: Physical Therapy

## 2011-02-15 ENCOUNTER — Ambulatory Visit (HOSPITAL_COMMUNITY): Payer: 59

## 2011-02-15 ENCOUNTER — Telehealth (HOSPITAL_COMMUNITY): Payer: Self-pay | Admitting: Physical Therapy

## 2011-02-18 ENCOUNTER — Ambulatory Visit (HOSPITAL_COMMUNITY): Payer: 59 | Admitting: Physical Therapy

## 2011-02-25 LAB — CBC
HCT: 45.1
MCHC: 34.8
MCV: 91.8
Platelets: 260
RDW: 14.3

## 2011-02-25 LAB — BASIC METABOLIC PANEL
BUN: 18
Chloride: 101
Creatinine, Ser: 1.35
Glucose, Bld: 102 — ABNORMAL HIGH
Potassium: 3.4 — ABNORMAL LOW

## 2011-03-26 ENCOUNTER — Other Ambulatory Visit: Payer: Self-pay | Admitting: Cardiology

## 2011-04-25 ENCOUNTER — Other Ambulatory Visit: Payer: Self-pay | Admitting: Cardiology

## 2011-05-29 ENCOUNTER — Other Ambulatory Visit: Payer: Self-pay | Admitting: *Deleted

## 2011-05-29 MED ORDER — METOPROLOL SUCCINATE ER 100 MG PO TB24: 100.0000 mg | ORAL_TABLET | Freq: Every day | ORAL | Status: DC

## 2011-06-21 ENCOUNTER — Ambulatory Visit (HOSPITAL_COMMUNITY)
Admission: RE | Admit: 2011-06-21 | Discharge: 2011-06-21 | Disposition: A | Discharge status: Home / Self Care | Payer: 59 | Source: Ambulatory Visit | Attending: Family Medicine | Admitting: Family Medicine

## 2011-06-21 ENCOUNTER — Other Ambulatory Visit (HOSPITAL_COMMUNITY): Payer: Self-pay | Admitting: Family Medicine

## 2011-06-21 DIAGNOSIS — R52 Pain, unspecified: Secondary | ICD-10-CM

## 2011-06-21 DIAGNOSIS — I739 Peripheral vascular disease, unspecified: Secondary | ICD-10-CM | POA: Insufficient documentation

## 2011-06-21 DIAGNOSIS — E119 Type 2 diabetes mellitus without complications: Secondary | ICD-10-CM | POA: Insufficient documentation

## 2011-06-21 DIAGNOSIS — M79609 Pain in unspecified limb: Secondary | ICD-10-CM | POA: Insufficient documentation

## 2011-06-26 ENCOUNTER — Other Ambulatory Visit: Payer: Self-pay | Admitting: Cardiology

## 2011-07-29 ENCOUNTER — Other Ambulatory Visit: Payer: Self-pay | Admitting: Orthopaedic Surgery

## 2011-07-29 DIAGNOSIS — M25551 Pain in right hip: Secondary | ICD-10-CM

## 2011-08-01 ENCOUNTER — Other Ambulatory Visit: Payer: Self-pay | Admitting: Orthopaedic Surgery

## 2011-08-01 LAB — CREATININE, SERUM: Creat: 1.38 mg/dL — ABNORMAL HIGH (ref 0.50–1.35)

## 2011-08-03 ENCOUNTER — Ambulatory Visit
Admission: RE | Admit: 2011-08-03 | Discharge: 2011-08-03 | Disposition: A | Discharge status: Home / Self Care | Payer: 59 | Source: Ambulatory Visit | Attending: Orthopaedic Surgery | Admitting: Orthopaedic Surgery

## 2011-08-03 DIAGNOSIS — M25551 Pain in right hip: Secondary | ICD-10-CM

## 2011-08-03 MED ORDER — GADOBENATE DIMEGLUMINE 529 MG/ML IV SOLN
20.0000 mL | Freq: Once | INTRAVENOUS | Status: AC | PRN
  Administered 2011-08-03: 20 mL via INTRAVENOUS

## 2011-12-17 ENCOUNTER — Encounter: Payer: Self-pay | Admitting: *Deleted

## 2011-12-17 ENCOUNTER — Ambulatory Visit (INDEPENDENT_AMBULATORY_CARE_PROVIDER_SITE_OTHER): Payer: 59 | Admitting: Cardiology

## 2011-12-17 ENCOUNTER — Encounter: Payer: Self-pay | Admitting: Cardiology

## 2011-12-17 VITALS — BP 122/80 | HR 71 | Ht 73.0 in | Wt 297.0 lb

## 2011-12-17 DIAGNOSIS — F17200 Nicotine dependence, unspecified, uncomplicated: Secondary | ICD-10-CM

## 2011-12-17 DIAGNOSIS — E876 Hypokalemia: Secondary | ICD-10-CM

## 2011-12-17 DIAGNOSIS — I10 Essential (primary) hypertension: Secondary | ICD-10-CM

## 2011-12-17 DIAGNOSIS — M1990 Unspecified osteoarthritis, unspecified site: Secondary | ICD-10-CM

## 2011-12-17 DIAGNOSIS — E119 Type 2 diabetes mellitus without complications: Secondary | ICD-10-CM

## 2011-12-17 DIAGNOSIS — E785 Hyperlipidemia, unspecified: Secondary | ICD-10-CM

## 2011-12-17 DIAGNOSIS — E669 Obesity, unspecified: Secondary | ICD-10-CM

## 2011-12-17 MED ORDER — POTASSIUM CHLORIDE CRYS ER 20 MEQ PO TBCR: 20.0000 meq | EXTENDED_RELEASE_TABLET | Freq: Every day | ORAL | Status: DC

## 2011-12-17 NOTE — Progress Notes (Deleted)
Name: Sean [name]    DOB: 04/06/1951  Age: 61 y.o.  MR#: [id number]       PCP:  Alice Reichert, MD      Insurance: @PAYORNAME @   CC:    Chief Complaint  Patient presents with  . Hypertension    No complaints/ Med list reviewed/TC  . Hyperlipidemia    VS BP 122/80  Pulse 71  Ht 6\' 1"  (1.854 m)  Wt 297 lb (134.718 kg)  BMI 39.18 kg/m2  SpO2 96%  Weights Current Weight  12/17/11 297 lb (134.718 kg)  12/17/10 291 lb (131.997 kg)  11/27/10 285 lb (129.275 kg)    Blood Pressure  BP Readings from Last 3 Encounters:  12/17/11 122/80  12/17/10 144/92  11/27/10 128/86     Admit date:  (Not on file) Last encounter with RMR:  06/26/2011   Allergy Allergies  Allergen Reactions  . Catapres (Clonidine Hydrochloride) Rash    Transdermal-skin eruption    Current Outpatient Prescriptions  Medication Sig Dispense Refill  . ACCU-CHEK AVIVA PLUS test strip       . aspirin 81 MG tablet Take 81 mg by mouth daily.      Marland Kitchen CARDURA 4 MG tablet TAKE (1) TABLET BY MOUTH DAILY.  30 each  9  . chlorthalidone (HYGROTON) 25 MG tablet TAKE ONE TABLET BY MOUTH ONCE DAILY.  30 tablet  6  . Cholecalciferol (VITAMIN D) 1000 UNITS capsule Take 1,000 Units by mouth daily.        . Choline Fenofibrate (TRILIPIX) 135 MG capsule Take 135 mg by mouth daily.        Marland Kitchen esomeprazole (NEXIUM) 40 MG capsule Take 40 mg by mouth daily.        Marland Kitchen ezetimibe-simvastatin (VYTORIN) 10-40 MG per tablet Take 1 tablet by mouth at bedtime.        Marland Kitchen glipiZIDE (GLUCOTROL) 10 MG tablet Take 10 mg by mouth daily.        Marland Kitchen HYDROcodone-acetaminophen (VICODIN ES) 7.5-750 MG per tablet Take 1 tablet by mouth every 6 (six) hours as needed.        Marland Kitchen LOTREL 10-40 MG per capsule TAKE (1) CAPSULE BY MOUTH ONCE DAILY.  30 each  8  . meloxicam (MOBIC) 15 MG tablet Take 15 mg by mouth daily.        . metFORMIN (GLUCOPHAGE) 500 MG tablet Take 500 mg by mouth 2 (two) times daily.        . methocarbamol (ROBAXIN) 500 MG tablet Take 500 mg  by mouth as needed.        . metoprolol (TOPROL XL) 100 MG 24 hr tablet Take 1 tablet (100 mg total) by mouth daily.  30 tablet  6  . NIASPAN 500 MG CR tablet TAKE 3 TABLETS BY MOUTH ONCE DAILY AS DIRECTED.  102 each  9  . VIAGRA 100 MG tablet Take 100 mg by mouth as needed.         Discontinued Meds:   There are no discontinued medications.  Patient Active Problem List  Diagnosis  . Diabetes mellitus type II  . HYPERLIPIDEMIA  . OBESITY  . TOBACCO ABUSE  . HYPERTENSION  . DJD (degenerative joint disease)  . Hypokalemia  . Abnormality of gait  . Difficulty in walking  . Weakness of right leg  . Pain in right hip    LABS No visits with results within 3 Month(s) from this visit. Latest known visit with results is:  Orders  Only on 08/01/2011  Component Date Value  . Creat 08/01/2011 1.38*     Results for this Opt Visit:     Results for orders placed in visit on 08/01/11  CREATININE, SERUM      Component Value Range   Creat 1.38 (*) 0.50 - 1.35 mg/dL    EKG Orders placed in visit on 12/26/10  . EKG 12-LEAD     Prior Assessment and Plan Problem List as of 12/17/2011            Cardiology Problems   HYPERLIPIDEMIA   Last Assessment & Plan Note   12/17/2010 Office Visit Signed 12/17/2010 12:02 PM by Kathlen Brunswick, MD    Lipid lowering therapy has been changed from atorvastatin to Vytorin and subsequently to a different dose of Vytorin.  A repeat lipid profile will be obtained.    HYPERTENSION   Last Assessment & Plan Note   11/27/2010 Office Visit Signed 11/27/2010  2:39 PM by Kathlen Brunswick, MD    Blood pressure control is good; Patient attests that similar values are obtained at home.  Current medications will be continued.      Other   Diabetes mellitus type II   Last Assessment & Plan Note   12/17/2010 Office Visit Signed 12/17/2010 11:59 AM by Kathlen Brunswick, MD    No recent A1c values available; fasting glucose has consistently been less than 100.     OBESITY   TOBACCO ABUSE   Last Assessment & Plan Note   12/17/2010 Office Visit Addendum 12/20/2010  2:46 PM by Kathlen Brunswick, MD    Tobacco cessation discussed with patient at length.  He is not at all inclined to attempt to stop tobacco use, and I cannot identify a ploy that would result in him changing his mind.    DJD (degenerative joint disease)   Last Assessment & Plan Note   12/17/2010 Office Visit Addendum 12/20/2010  2:45 PM by Kathlen Brunswick, MD    Patient tells me that surgery scheduled for 2 weeks hence.  In the absence of known coronary disease and with a negative stress echocardiogram, his surgical risk for the proposed procedure is quite acceptable.  No additional treatment or testing would significantly impact the magnitude of that risk.  [facility] will be available as needed in hospital.    Hypokalemia   Last Assessment & Plan Note   12/17/2010 Office Visit Signed 12/17/2010 12:03 PM by Kathlen Brunswick, MD    We are attempting to treat mild hypokalemia with dietary measures.  I am not sanguine about the outcome of this trial.  Chemistry profile will be repeated in 1-2 months at which time supplemental potassium will likely be necessary.    Abnormality of gait   Difficulty in walking   Weakness of right leg   Pain in right hip       Imaging: No results found.   FRS Calculation: Score not calculated. Missing: Total Cholesterol

## 2011-12-17 NOTE — Assessment & Plan Note (Addendum)
Patient occasionally monitors blood pressure at home with systolics consistently below 130 and diastolics below 90.  Hypertension is well controlled with current medications, which will be continued.

## 2011-12-17 NOTE — Assessment & Plan Note (Signed)
Patient is not inclined to change his lifestyle to achieve weight loss.

## 2011-12-17 NOTE — Assessment & Plan Note (Signed)
Superb control of hyperlipidemia with current therapy, which will be continued.

## 2011-12-17 NOTE — Progress Notes (Signed)
Patient ID: Sean [name], male   DOB: [birth year], 61 y.o.   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  Since his last visit, he has done extremely well from a health standpoint.  He has not required hospitalization or evaluation in the emergency department except electively for an uncomplicated right THA approximately one year ago.  He reports no respiratory problems including no episodes of bronchitis.  Control of hypertension, diabetes and hyperlipidemia have been generally good.  He reports continued cigarette smoking, which he enjoys and does not wish to stop.  He is also overweight and has a sedentary lifestyle, but believes that he exercises adequately by walking at work.  Prior to Admission medications   Medication Sig Start Date End Date Taking? Authorizing Provider  ACCU-CHEK AVIVA PLUS test strip  12/17/10  Yes Historical Provider, MD  aspirin 81 MG tablet Take 81 mg by mouth daily.   Yes Historical Provider, MD  CARDURA 4 MG tablet TAKE (1) TABLET BY MOUTH DAILY. 03/26/11  Yes Kathlen Brunswick, MD  chlorthalidone (HYGROTON) 25 MG tablet TAKE ONE TABLET BY MOUTH ONCE DAILY. 06/26/11  Yes Kathlen Brunswick, MD  Cholecalciferol (VITAMIN D) 1000 UNITS capsule Take 1,000 Units by mouth daily.     Yes Historical Provider, MD  Choline Fenofibrate (TRILIPIX) 135 MG capsule Take 135 mg by mouth daily.     Yes Historical Provider, MD  esomeprazole (NEXIUM) 40 MG capsule Take 40 mg by mouth daily.     Yes Historical Provider, MD  ezetimibe-simvastatin (VYTORIN) 10-40 MG per tablet Take 1 tablet by mouth at bedtime.     Yes Historical Provider, MD  glipiZIDE (GLUCOTROL) 10 MG tablet Take 10 mg by mouth daily.     Yes Historical Provider, MD  HYDROcodone-acetaminophen (VICODIN ES) 7.5-750 MG per tablet Take 1 tablet by mouth every 6 (six) hours as  needed.     Yes Historical Provider, MD  LOTREL 10-40 MG per capsule TAKE (1) CAPSULE BY MOUTH ONCE DAILY. 04/25/11  Yes Kathlen Brunswick, MD  meloxicam (MOBIC) 15 MG tablet Take 15 mg by mouth daily.     Yes Historical Provider, MD  metFORMIN (GLUCOPHAGE) 500 MG tablet Take 500 mg by mouth 2 (two) times daily.     Yes Historical Provider, MD  methocarbamol (ROBAXIN) 500 MG tablet Take 500 mg by mouth as needed.     Yes Historical Provider, MD  metoprolol (TOPROL XL) 100 MG 24 hr tablet Take 1 tablet (100 mg total) by mouth daily. 05/29/11  Yes Kathlen Brunswick, MD  NIASPAN 500 MG CR tablet TAKE 3 TABLETS BY MOUTH ONCE DAILY AS DIRECTED. 03/26/11  Yes Kathlen Brunswick, MD  VIAGRA 100 MG tablet Take 100 mg by mouth as needed.  10/29/10  Yes Historical Provider, MD   Allergies  Allergen Reactions  . Catapres (Clonidine Hydrochloride) Rash    Transdermal-skin eruption     Past medical history, social history, and family history reviewed and updated.  ROS: Denies orthopnea, PND, lightheadedness, palpitations or syncope.  Denies a productive cough, but occasionally notes wheezing without other respiratory symptoms.  Occasionally restless during sleep with a history of snoring, but no excessive somnolence outside of his sleep hours.  PHYSICAL EXAM: BP 122/80  Pulse 71  Ht 6\' 1"  (1.854 m)  Wt 134.718 kg (297 lb)  BMI 39.18  kg/m2  SpO2 96%  General-Well developed; no acute distress Body habitus-Mildly obese Neck-No JVD; no carotid bruits Lungs-No rales; Substantial prolongation of the expiratory phase with few expiratory rhonchi Cardiovascular-normal PMI; normal S1 and S2 Abdomen-normal bowel sounds; soft and non-tender without masses or organomegaly Musculoskeletal-No deformities, no cyanosis or clubbing Neurologic-Normal cranial nerves; symmetric strength and tone Skin-Warm, no significant lesions Extremities-distal pulses intact; no edema  ASSESSMENT AND PLAN:  Sean Bing,  MD 12/17/2011 11:58 AM

## 2011-12-17 NOTE — Assessment & Plan Note (Signed)
Continues to express no interest in decreasing or discontinuing tobacco use.

## 2011-12-17 NOTE — Assessment & Plan Note (Signed)
Potassium has been consistently in the low 3s.  No recent values available, but I have been inclined to add a potassium supplement in the past.  He will start 20 mEq per day and return for a BMet in one month.

## 2011-12-17 NOTE — Patient Instructions (Addendum)
Your physician recommends that you schedule a follow-up appointment in: 1 year  Your physician has recommended you make the following change in your medication:  1 - START Potassium 20 meq daily  Your physician recommends that you return for lab work in: 1 month (you will receive a reminder letter)

## 2011-12-17 NOTE — Assessment & Plan Note (Signed)
Excellent control of diabetes with current therapy.

## 2011-12-27 ENCOUNTER — Other Ambulatory Visit: Payer: Self-pay | Admitting: Cardiology

## 2011-12-27 LAB — BASIC METABOLIC PANEL
BUN: 25 mg/dL — ABNORMAL HIGH (ref 6–23)
Calcium: 9.8 mg/dL (ref 8.4–10.5)
Creat: 1.32 mg/dL (ref 0.50–1.35)

## 2011-12-30 ENCOUNTER — Encounter: Payer: Self-pay | Admitting: *Deleted

## 2012-01-02 ENCOUNTER — Other Ambulatory Visit: Payer: Self-pay | Admitting: Cardiology

## 2012-01-31 ENCOUNTER — Other Ambulatory Visit: Payer: Self-pay | Admitting: Cardiology

## 2012-03-02 ENCOUNTER — Other Ambulatory Visit: Payer: Self-pay | Admitting: Cardiology

## 2012-03-27 ENCOUNTER — Other Ambulatory Visit (HOSPITAL_COMMUNITY): Payer: Self-pay | Admitting: Family Medicine

## 2012-03-27 ENCOUNTER — Ambulatory Visit (HOSPITAL_COMMUNITY)
Admission: RE | Admit: 2012-03-27 | Discharge: 2012-03-27 | Disposition: A | Discharge status: Home / Self Care | Payer: 59 | Source: Ambulatory Visit | Attending: Family Medicine | Admitting: Family Medicine

## 2012-03-27 DIAGNOSIS — F17200 Nicotine dependence, unspecified, uncomplicated: Secondary | ICD-10-CM | POA: Insufficient documentation

## 2012-03-27 DIAGNOSIS — R059 Cough, unspecified: Secondary | ICD-10-CM | POA: Insufficient documentation

## 2012-03-27 DIAGNOSIS — F17210 Nicotine dependence, cigarettes, uncomplicated: Secondary | ICD-10-CM

## 2012-03-27 DIAGNOSIS — R05 Cough: Secondary | ICD-10-CM | POA: Insufficient documentation

## 2012-04-03 ENCOUNTER — Other Ambulatory Visit: Payer: Self-pay | Admitting: Cardiology

## 2012-04-30 ENCOUNTER — Other Ambulatory Visit: Payer: Self-pay | Admitting: Cardiology

## 2012-05-25 ENCOUNTER — Other Ambulatory Visit (HOSPITAL_COMMUNITY): Payer: Self-pay | Admitting: Orthopaedic Surgery

## 2012-05-25 DIAGNOSIS — M545 Low back pain: Secondary | ICD-10-CM

## 2012-05-29 ENCOUNTER — Other Ambulatory Visit: Payer: Self-pay | Admitting: Cardiology

## 2012-06-01 ENCOUNTER — Encounter (HOSPITAL_COMMUNITY)
Admission: RE | Admit: 2012-06-01 | Discharge: 2012-06-01 | Disposition: A | Discharge status: Home / Self Care | Payer: 59 | Source: Ambulatory Visit | Attending: Orthopaedic Surgery | Admitting: Orthopaedic Surgery

## 2012-06-01 ENCOUNTER — Other Ambulatory Visit (HOSPITAL_COMMUNITY): Payer: Self-pay | Admitting: Orthopaedic Surgery

## 2012-06-01 DIAGNOSIS — Z96649 Presence of unspecified artificial hip joint: Secondary | ICD-10-CM | POA: Insufficient documentation

## 2012-06-01 DIAGNOSIS — T84038A Mechanical loosening of other internal prosthetic joint, initial encounter: Secondary | ICD-10-CM

## 2012-06-01 DIAGNOSIS — M545 Low back pain: Secondary | ICD-10-CM

## 2012-06-01 DIAGNOSIS — M25559 Pain in unspecified hip: Secondary | ICD-10-CM | POA: Insufficient documentation

## 2012-06-01 MED ORDER — TECHNETIUM TC 99M MEDRONATE IV KIT
25.0000 | PACK | Freq: Once | INTRAVENOUS | Status: AC | PRN
  Administered 2012-06-01: 25 via INTRAVENOUS

## 2012-06-15 ENCOUNTER — Other Ambulatory Visit: Payer: Self-pay | Admitting: Neurosurgery

## 2012-06-15 DIAGNOSIS — IMO0002 Reserved for concepts with insufficient information to code with codable children: Secondary | ICD-10-CM

## 2012-06-23 ENCOUNTER — Ambulatory Visit
Admission: RE | Admit: 2012-06-23 | Discharge: 2012-06-23 | Disposition: A | Discharge status: Home / Self Care | Payer: 59 | Source: Ambulatory Visit | Attending: Neurosurgery | Admitting: Neurosurgery

## 2012-06-23 DIAGNOSIS — IMO0002 Reserved for concepts with insufficient information to code with codable children: Secondary | ICD-10-CM

## 2012-06-23 MED ORDER — GADOBENATE DIMEGLUMINE 529 MG/ML IV SOLN
20.0000 mL | Freq: Once | INTRAVENOUS | Status: AC | PRN
  Administered 2012-06-23: 20 mL via INTRAVENOUS

## 2012-06-29 ENCOUNTER — Other Ambulatory Visit: Payer: Self-pay | Admitting: Neurosurgery

## 2012-06-30 ENCOUNTER — Telehealth: Payer: Self-pay | Admitting: *Deleted

## 2012-06-30 NOTE — Telephone Encounter (Signed)
He will require an office appointment prior to planned surgery.

## 2012-06-30 NOTE — Telephone Encounter (Signed)
Appointment scheduled for patient to see Joni Reining, NP next week for clearance.  Pt aware.

## 2012-06-30 NOTE — Telephone Encounter (Signed)
Patient is to have L3-4 L5-s1 posterior lumbar fusion on March 27 th to be performed by Dr Maeola Harman (fax [phone]) and will need surgical clearance.  Was last seen in July 2013 and is not due for follow up until July 2014.  Please advise as to if pt will need a visit prior to clearance.

## 2012-07-08 ENCOUNTER — Encounter: Payer: Self-pay | Admitting: Adult Health

## 2012-07-08 ENCOUNTER — Encounter: Payer: Self-pay | Admitting: *Deleted

## 2012-07-08 ENCOUNTER — Ambulatory Visit (INDEPENDENT_AMBULATORY_CARE_PROVIDER_SITE_OTHER): Payer: 59 | Admitting: Adult Health

## 2012-07-08 VITALS — BP 113/79 | HR 77 | Ht 73.0 in | Wt 293.0 lb

## 2012-07-08 DIAGNOSIS — E669 Obesity, unspecified: Secondary | ICD-10-CM

## 2012-07-08 DIAGNOSIS — E785 Hyperlipidemia, unspecified: Secondary | ICD-10-CM

## 2012-07-08 DIAGNOSIS — I10 Essential (primary) hypertension: Secondary | ICD-10-CM

## 2012-07-08 DIAGNOSIS — E876 Hypokalemia: Secondary | ICD-10-CM

## 2012-07-08 NOTE — Progress Notes (Signed)
HPI: Mr.Ambrosia is a 63 y/o patient of Dr.Rothbart we are following for ongoing assessment and treatment of hypertension, with multiple CVRF. He has had stress echo in 2012 negative for WMA or ischemia and stress myoview in 2011 negative for ischemia. He is on chlorthalidone and lotrel for hypertension but has stopped taking potassium replacement because of chest pressure that occurs when he takes it. He is to have planned lumbar surgery in March per Dr. Venetia Maxon in [facility], and is in need of pre-operative cardiac evaluation. He offers no cardiac complaints, DOE or weakness. He is medically compliant with the exception of potassium. He admits to some dietary indiscretion concerning diabetic diet.   Allergies  Allergen Reactions  . Catapres (Clonidine Hydrochloride) Rash    Transdermal-skin eruption    Current Outpatient Prescriptions  Medication Sig Dispense Refill  . ACCU-CHEK AVIVA PLUS test strip       . amLODipine-benazepril (LOTREL) 10-40 MG per capsule TAKE (1) CAPSULE BY MOUTH ONCE DAILY.  30 capsule  11  . aspirin 81 MG tablet Take 81 mg by mouth daily.      Marland Kitchen CARDURA 4 MG tablet TAKE (1) TABLET BY MOUTH DAILY.  30 tablet  10  . chlorthalidone (HYGROTON) 25 MG tablet TAKE ONE TABLET BY MOUTH ONCE DAILY.  30 tablet  6  . Cholecalciferol (VITAMIN D) 1000 UNITS capsule Take 1,000 Units by mouth daily.        . Choline Fenofibrate (TRILIPIX) 135 MG capsule Take 135 mg by mouth daily.        Marland Kitchen esomeprazole (NEXIUM) 40 MG capsule Take 40 mg by mouth daily.        Marland Kitchen ezetimibe-simvastatin (VYTORIN) 10-40 MG per tablet Take 1 tablet by mouth at bedtime.        Marland Kitchen glipiZIDE (GLUCOTROL) 10 MG tablet Take 10 mg by mouth daily.        Marland Kitchen HYDROcodone-acetaminophen (VICODIN ES) 7.5-750 MG per tablet Take 1 tablet by mouth every 6 (six) hours as needed.        . meloxicam (MOBIC) 15 MG tablet Take 15 mg by mouth daily.        . metFORMIN (GLUCOPHAGE) 500 MG tablet Take 500 mg by mouth 2 (two) times daily.         . methocarbamol (ROBAXIN) 500 MG tablet Take 500 mg by mouth as needed.        Marland Kitchen NIASPAN 500 MG CR tablet TAKE 3 TABLETS BY MOUTH ONCE DAILY AS DIRECTED.  90 tablet  3  . TOPROL XL 100 MG 24 hr tablet TAKE ONE TABLET BY MOUTH ONCE DAILY.  30 tablet  11  . VIAGRA 100 MG tablet Take 100 mg by mouth as needed.       . potassium chloride SA (K-DUR,KLOR-CON) 20 MEQ tablet Take 1 tablet (20 mEq total) by mouth daily.  90 tablet  3   No current facility-administered medications for this visit.    Past Medical History  Diagnosis Date  . HTN (hypertension)   . HLD (hyperlipidemia)   . Tobacco abuse     70 pack years  . Abnormal EKG     negative stress nuclear-2005; probable inferolateral scarring in 2007- negative EKG at 10 mets   . Diabetes mellitus     AODM- no insulin  . GERD (gastroesophageal reflux disease)   . 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 Obesity     Past Surgical History  Procedure Laterality Date  . Total hip arthroplasty  2005    Left  . Hernia repair  1997  . Lumbar spine surgery  2004    Laminectomy and fusion  . Cataract extraction  2011-12    Bilateral    ZOX:WRUEAV of systems complete and found to be negative unless listed above  PHYSICAL EXAM BP 113/79  Pulse 77  Ht 6\' 1"  (1.854 m)  Wt 293 lb (132.904 kg)  BMI 38.67 kg/m2  General: Well developed, well nourished, in no acute distress Head: Eyes PERRLA, No xanthomas.   Normal cephalic and atramatic  Lungs: Clear bilaterally to auscultation and percussion. Heart: HRRR S1 S2, without MRG.  Pulses are 2+ & equal.            No carotid bruit. No JVD.  No abdominal bruits. No femoral bruits. Abdomen: Bowel sounds are positive, abdomen soft and non-tender without masses or                  Hernia's noted. Msk:  Back normal, normal gait. Normal strength and tone for age. Extremities: No clubbing, cyanosis or edema.  DP +1 Neuro: Alert and oriented X 3. Psych:  Good affect,  responds appropriately  EKG: NSR with sinus arrhythmia rate of 76 bpm.  ASSESSMENT AND PLAN

## 2012-07-08 NOTE — Assessment & Plan Note (Signed)
Follow up BMET is ordered for evaluation and need to restart replacement should this be necessary prior to surgery.

## 2012-07-08 NOTE — Patient Instructions (Addendum)
Your physician recommends that you schedule a follow-up appointment in: 3 months  Your physician recommends that you return for lab work in: today  We will send a surgical clearance to Dr Venetia Maxon today

## 2012-07-08 NOTE — Assessment & Plan Note (Signed)
He is followed by Dr. Renard Matter and labs will be completed by him on follow up.

## 2012-07-08 NOTE — Assessment & Plan Note (Signed)
Weight loss is recommended. Hopefully after surgery he can begin to be more active which will assist in this.

## 2012-07-08 NOTE — Progress Notes (Deleted)
Name: Sean [name]    DOB: 1950-12-29  Age: 62 y.o.  MR#: [id number]       PCP:  Alice Reichert, MD      Insurance: Payor: Cleatrice Burke  Plan: [place] [employer]  Product Type: *No Product type*    CC:   No chief complaint on file.   VS Filed Vitals:   07/08/12 1404  BP: 113/79  Pulse: 77  Height: 6\' 1"  (1.854 m)  Weight: 293 lb (132.904 kg)    Weights Current Weight  07/08/12 293 lb (132.904 kg)  12/17/11 297 lb (134.718 kg)  12/17/10 291 lb (131.997 kg)    Blood Pressure  BP Readings from Last 3 Encounters:  07/08/12 113/79  12/17/11 122/80  12/17/10 144/92     Admit date:  (Not on file) Last encounter with RMR:  Visit date not found   Allergy Catapres  Current Outpatient Prescriptions  Medication Sig Dispense Refill  . ACCU-CHEK AVIVA PLUS test strip       . amLODipine-benazepril (LOTREL) 10-40 MG per capsule TAKE (1) CAPSULE BY MOUTH ONCE DAILY.  30 capsule  11  . aspirin 81 MG tablet Take 81 mg by mouth daily.      Marland Kitchen CARDURA 4 MG tablet TAKE (1) TABLET BY MOUTH DAILY.  30 tablet  10  . chlorthalidone (HYGROTON) 25 MG tablet TAKE ONE TABLET BY MOUTH ONCE DAILY.  30 tablet  6  . Cholecalciferol (VITAMIN D) 1000 UNITS capsule Take 1,000 Units by mouth daily.        . Choline Fenofibrate (TRILIPIX) 135 MG capsule Take 135 mg by mouth daily.        Marland Kitchen esomeprazole (NEXIUM) 40 MG capsule Take 40 mg by mouth daily.        Marland Kitchen ezetimibe-simvastatin (VYTORIN) 10-40 MG per tablet Take 1 tablet by mouth at bedtime.        Marland Kitchen glipiZIDE (GLUCOTROL) 10 MG tablet Take 10 mg by mouth daily.        Marland Kitchen HYDROcodone-acetaminophen (VICODIN ES) 7.5-750 MG per tablet Take 1 tablet by mouth every 6 (six) hours as needed.        . meloxicam (MOBIC) 15 MG tablet Take 15 mg by mouth daily.        . metFORMIN (GLUCOPHAGE) 500 MG tablet Take 500 mg by mouth 2 (two) times daily.        . methocarbamol (ROBAXIN) 500 MG tablet Take 500 mg by mouth as needed.        Marland Kitchen NIASPAN 500 MG CR tablet  TAKE 3 TABLETS BY MOUTH ONCE DAILY AS DIRECTED.  90 tablet  3  . TOPROL XL 100 MG 24 hr tablet TAKE ONE TABLET BY MOUTH ONCE DAILY.  30 tablet  11  . VIAGRA 100 MG tablet Take 100 mg by mouth as needed.       . potassium chloride SA (K-DUR,KLOR-CON) 20 MEQ tablet Take 1 tablet (20 mEq total) by mouth daily.  90 tablet  3   No current facility-administered medications for this visit.    Discontinued Meds:   There are no discontinued medications.  Patient Active Problem List  Diagnosis  . Diabetes mellitus type II  . HYPERLIPIDEMIA  . OBESITY  . TOBACCO ABUSE  . HYPERTENSION  . DJD (degenerative joint disease)  . Hypokalemia    LABS @BMET3 @  @CMPRESULT3 @ @CBC3 @  Lipid Panel     Component Value Date/Time   CHOL 107 03/24/2010 1853   TRIG 127 03/24/2010  1853   HDL 33* 03/24/2010 1853   CHOLHDL 3.2 Ratio 03/24/2010 1853   VLDL 25 03/24/2010 1853   LDLCALC 49 03/24/2010 1853    ABG No results found for this basename: phart, pco2, pco2art, po2, po2art, hco3, tco2, acidbasedef, o2sat     BNP (last 3 results) No results found for this basename: PROBNP,  in the last 8760 hours Cardiac Panel (last 3 results) No results found for this basename: CKTOTAL, CKMB, TROPONINI, RELINDX,  in the last 72 hours  Iron/TIBC/Ferritin No results found for this basename: iron, tibc, ferritin     EKG Orders placed in visit on 07/08/12  . EKG 12-LEAD     Prior Assessment and Plan Problem List as of 07/08/2012     ICD-9-CM     Cardiology Problems   HYPERLIPIDEMIA   Last Assessment & Plan   12/17/2011 Office Visit Written 12/17/2011 12:05 PM by Kathlen Brunswick, MD     Superb control of hyperlipidemia with current therapy, which will be continued.    HYPERTENSION   Last Assessment & Plan   12/17/2011 Office Visit Edited 12/17/2011 12:05 PM by Kathlen Brunswick, MD     Patient occasionally monitors blood pressure at home with systolics consistently below 130 and diastolics below 90.   Hypertension is well controlled with current medications, which will be continued.      Other   Diabetes mellitus type II   Last Assessment & Plan   12/17/2011 Office Visit Written 12/17/2011 12:03 PM by Kathlen Brunswick, MD     Excellent control of diabetes with current therapy.    OBESITY   Last Assessment & Plan   12/17/2011 Office Visit Written 12/17/2011 12:06 PM by Kathlen Brunswick, MD     Patient is not inclined to change his lifestyle to achieve weight loss.    TOBACCO ABUSE   Last Assessment & Plan   12/17/2011 Office Visit Written 12/17/2011 12:07 PM by Kathlen Brunswick, MD     Continues to express no interest in decreasing or discontinuing tobacco use.    DJD (degenerative joint disease)   Last Assessment & Plan   12/17/2010 Office Visit Edited 12/20/2010  2:45 PM by Kathlen Brunswick, MD     Patient tells me that surgery scheduled for 2 weeks hence.  In the absence of known coronary disease and with a negative stress echocardiogram, his surgical risk for the proposed procedure is quite acceptable.  No additional treatment or testing would significantly impact the magnitude of that risk.  [facility] will be available as needed in hospital.    Hypokalemia   Last Assessment & Plan   12/17/2011 Office Visit Written 12/17/2011 12:06 PM by Kathlen Brunswick, MD     Potassium has been consistently in the low 3s.  No recent values available, but I have been inclined to add a potassium supplement in the past.  He will start 20 mEq per day and return for a BMet in one month.        Imaging: Mr Lumbar Spine W Wo Contrast  06/23/2012  *RADIOLOGY REPORT*  Clinical Data: Lumbosacral neuritis.  Right leg pain  BUN and creatinine were obtained on site at [facility] at [address] Results:  BUN 17 mg/dL,  Creatinine 1.3 mg/dL.  MRI LUMBAR SPINE WITHOUT AND WITH CONTRAST  Technique:  Multiplanar and multiecho pulse sequences of the lumbar spine were obtained without and  with intravenous contrast.  Contrast: 20mL MULTIHANCE  GADOBENATE DIMEGLUMINE 529 MG/ML IV SOLN  Comparison: Lumbar MRI 08/03/2011  Findings: 5 mm anterior slip L4-5 is unchanged.  Negative for fracture or mass lesion.  No evidence of spinal infection.  The patient has short pedicles with congenital lumbar stenosis throughout the lumbar spine.  Conus medullaris is normal and terminates at L1-2.  Atrophy left psoas muscle is unchanged.  L1-2:  Disc degeneration with disc bulging and mild spinal stenosis, unchanged.  L2-3:  Mild disc bulging and mild facet hypertrophy with mild spinal stenosis, unchanged.  L3-4:  Disc degeneration with diffuse disc bulging.  Right lateral disc protrusion is unchanged.  There is facet hypertrophy and moderate spinal stenosis which is unchanged.  Mild foraminal narrowing bilaterally.  L4-5:  5 mm anterior slip is unchanged.  There is diffuse disc bulging.  Severe facet degeneration with bilateral facet hypertrophy and bilateral facet joint effusions.  There is severe spinal stenosis which has progressed in the interval.  There is marked foraminal encroachment bilaterally with impingement of the L4 nerve root bilaterally.  There is been prior laminectomy on the left, unchanged from the prior study.  L5-S1:  Postop laminotomy bilaterally.  There is disc bulging and facet hypertrophy causing moderate to severe foraminal encroachment bilaterally which is unchanged.  IMPRESSION: Congenital lumbar stenosis.  Mild spinal stenosis L1-2 and L2-3. Moderate stenosis L3-4 with a right lateral disc protrusion at L3-4 is unchanged.  Severe spinal stenosis L4-5 has progressed in the interval.  There is marked foraminal encroachment bilaterally  Moderate foraminal encroachment bilaterally L5-S1 is unchanged.   Original Report Authenticated By: Janeece Riggers, M.D.

## 2012-07-08 NOTE — Assessment & Plan Note (Signed)
Blood pressure well controlled at present. I am concerned that with use of chlorthalidone he may have hypokalemia, as he has had in the past. He stopped taking replacement as this gave him "chest pressure." I will repeat BMET for evaluation. He is on ACE inhibitor, which will help with  Potassium status. I have discussed that he will need replacement for optimal electrolyte balance perioperatively. His last stress test in 2012 was negative for ischemia. He can proceed with back surgery if BMET is WNL.

## 2012-07-09 LAB — BASIC METABOLIC PANEL
BUN: 20 mg/dL (ref 6–23)
CO2: 27 mEq/L (ref 19–32)
Chloride: 103 mEq/L (ref 96–112)
Glucose, Bld: 106 mg/dL — ABNORMAL HIGH (ref 70–99)
Potassium: 3.8 mEq/L (ref 3.5–5.3)

## 2012-08-03 ENCOUNTER — Encounter (HOSPITAL_COMMUNITY): Payer: Self-pay | Admitting: Pharmacy Technician

## 2012-08-03 ENCOUNTER — Telehealth: Payer: Self-pay | Admitting: *Deleted

## 2012-08-03 MED ORDER — NIACIN ER (ANTIHYPERLIPIDEMIC) 500 MG PO TBCR: 1500.0000 mg | EXTENDED_RELEASE_TABLET | Freq: Every day | ORAL | Status: DC

## 2012-08-03 NOTE — Telephone Encounter (Signed)
Noted incoming fax from [place] to request refill for pt Niacin ER 500mg  take 3 tablets once daily as directed, sent via escribe

## 2012-08-05 NOTE — Pre-Procedure Instructions (Signed)
Sean [name]  08/05/2012   Your procedure is scheduled on:  Thursday, March 27 th   Report to [facility] at 5:30 AM.  Call this number if you have problems the morning of surgery: [phone]   Remember:   Do not eat food or drink liquids after midnight.   Take these medicines the morning of surgery with A SIP OF WATER: Amlodipine-benazepril (Lotrel), doxazosin (cardura),Esomeprazole (Nexium), Hydrocodone (Vicodin), Methocarbamol (Robaxain), Metoprolol (Toprol)   Do not wear jewelry  Do not wear lotions, powders or perfumes.              You may wear deodorant.  Do not shave 48 hours prior to surgery. Men may shave face and neck.   Do not bring valuables to the hospital.  Contacts, dentures or bridgework may not be worn into surgery.  Leave suitcase in the car. After surgery it may be brought to your room.  For patients admitted to the hospital, checkout time is 11:00 AM the day of  discharge.   Patients discharged the day of surgery will not be allowed to drive  home.  Name and phone number of your driver:  Special Instructions: Shower using CHG 2 nights before surgery and the night before surgery.  If you shower the day of surgery use CHG.  Use special wash - you have one bottle of CHG for all showers.  You should use approximately 1/3 of the bottle for each shower.   Please read over the following fact sheets that you were given: Pain Booklet, Coughing and Deep Breathing, Blood Transfusion Information, MRSA Information and Surgical Site Infection Prevention

## 2012-08-06 ENCOUNTER — Encounter (HOSPITAL_COMMUNITY)
Admission: RE | Admit: 2012-08-06 | Discharge: 2012-08-06 | Disposition: A | Discharge status: Home / Self Care | Payer: 59 | Source: Ambulatory Visit | Attending: Neurosurgery | Admitting: Neurosurgery

## 2012-08-06 ENCOUNTER — Ambulatory Visit (HOSPITAL_COMMUNITY)
Admission: RE | Admit: 2012-08-06 | Discharge: 2012-08-06 | Disposition: A | Discharge status: Home / Self Care | Payer: 59 | Source: Ambulatory Visit | Attending: Anesthesiology | Admitting: Anesthesiology

## 2012-08-06 ENCOUNTER — Encounter (HOSPITAL_COMMUNITY): Payer: Self-pay

## 2012-08-06 DIAGNOSIS — Z01818 Encounter for other preprocedural examination: Secondary | ICD-10-CM | POA: Insufficient documentation

## 2012-08-06 DIAGNOSIS — Z01812 Encounter for preprocedural laboratory examination: Secondary | ICD-10-CM | POA: Insufficient documentation

## 2012-08-06 HISTORY — DX: Shortness of breath: R06.02

## 2012-08-06 HISTORY — DX: Personal history of other specified conditions: Z87.898

## 2012-08-06 HISTORY — DX: Personal history of other diseases of the digestive system: Z87.19

## 2012-08-06 LAB — CBC
Hemoglobin: 14.6 g/dL (ref 13.0–17.0)
MCH: 30.5 pg (ref 26.0–34.0)
MCHC: 34 g/dL (ref 30.0–36.0)

## 2012-08-06 LAB — BASIC METABOLIC PANEL
BUN: 16 mg/dL (ref 6–23)
GFR calc non Af Amer: 74 mL/min — ABNORMAL LOW (ref >90)
Glucose, Bld: 143 mg/dL — ABNORMAL HIGH (ref 70–99)
Potassium: 3.7 mEq/L (ref 3.5–5.1)

## 2012-08-06 LAB — TYPE AND SCREEN: ABO/RH(D): O POS

## 2012-08-06 LAB — SURGICAL PCR SCREEN: Staphylococcus aureus: NEGATIVE

## 2012-08-06 NOTE — Progress Notes (Signed)
Primary Physician - Dr. Renard Matter Cardiologist - Dr. Mendel Ryder [phone], echo, ekg few weeks ago - will request records

## 2012-08-06 NOTE — Progress Notes (Signed)
08/06/12 0912  OBSTRUCTIVE SLEEP APNEA  Have you ever been diagnosed with sleep apnea through a sleep study? No  Do you snore loudly (loud enough to be heard through closed doors)?  1  Do you often feel tired, fatigued, or sleepy during the daytime? 1  Has anyone observed you stop breathing during your sleep? 0  Do you have, or are you being treated for high blood pressure? 1  BMI more than 35 kg/m2? 1  Age over 61 years old? 1  Neck circumference greater than 40 cm/18 inches? 1  Gender: 1  Obstructive Sleep Apnea Score 7  Score 4 or greater  Results sent to PCP

## 2012-08-07 NOTE — Progress Notes (Signed)
Anesthesia chart review: Patient is a 62 year old male scheduled for redo laminectomy, L3-4, L4-5, L5-S1 PLIF by Dr. Venetia Maxon on 08/13/2012. History includes obesity, smoking, hypertension, hyperlipidemia, GERD, hiatal hernia, diabetes mellitus type 2, DJD, lumbar laminectomy '04, bilateral THA, cataract extraction. OSA screening score was a 7. PCP is Dr. Renard Matter.  Cardiologist is Dr. Dietrich Pates. He was seen by [facility] NP Joni Reining on 07/08/12 for follow-up and preoperative evaluation.  EKG then showed NSR with first degree AVB, borderline LAA.  He had a negative stress test in 2012, so she felt he could proceed if his BMET results were acceptable (he's had problems with hypokalemia).   Stress echo on 11/28/10 showed:  - Stress ECG conclusions: There were no significant stress arrhythmias or conduction abnormalities; rare PVC. The stress ECG was borderline positivefor ischemia-T waves became bi-phasic, and 1-1.5 mm of flat ST depression was noted in the inferior leads. - Staged echo: There was no echocardiographic evidence for stress-induced ischemia. Left ventricular ejection fraction was normal at rest and with stress.  Preoperative chest x-ray and labs noted.  If no acute changes then would anticipate he could proceed as planned.  Velna Ochs [facility]/Anesthesiology Phone [phone] 08/07/2012 4:03 PM

## 2012-08-12 MED ORDER — CEFAZOLIN SODIUM-DEXTROSE 2-3 GM-% IV SOLR
2.0000 g | INTRAVENOUS | Status: DC
  Filled 2012-08-12: qty 50

## 2012-08-13 ENCOUNTER — Encounter (HOSPITAL_COMMUNITY): Payer: Self-pay | Admitting: Vascular Surgery

## 2012-08-13 ENCOUNTER — Encounter (HOSPITAL_COMMUNITY)
Admission: RE | Disposition: A | Discharge status: Home / Self Care | Payer: Self-pay | Source: Ambulatory Visit | Attending: Neurosurgery

## 2012-08-13 ENCOUNTER — Ambulatory Visit (HOSPITAL_COMMUNITY): Payer: 59

## 2012-08-13 ENCOUNTER — Inpatient Hospital Stay (HOSPITAL_COMMUNITY)
Admission: RE | Admit: 2012-08-13 | Discharge: 2012-08-17 | DRG: 460 | Disposition: A | Discharge status: Home / Self Care | Payer: 59 | Source: Ambulatory Visit | Attending: Neurosurgery | Admitting: Neurosurgery

## 2012-08-13 ENCOUNTER — Encounter (HOSPITAL_COMMUNITY): Payer: Self-pay | Admitting: *Deleted

## 2012-08-13 ENCOUNTER — Ambulatory Visit (HOSPITAL_COMMUNITY): Payer: 59 | Admitting: Certified Registered Nurse Anesthetist

## 2012-08-13 DIAGNOSIS — M4316 Spondylolisthesis, lumbar region: Secondary | ICD-10-CM

## 2012-08-13 DIAGNOSIS — E119 Type 2 diabetes mellitus without complications: Secondary | ICD-10-CM | POA: Diagnosis present

## 2012-08-13 DIAGNOSIS — M5137 Other intervertebral disc degeneration, lumbosacral region: Secondary | ICD-10-CM | POA: Diagnosis present

## 2012-08-13 DIAGNOSIS — Z96649 Presence of unspecified artificial hip joint: Secondary | ICD-10-CM

## 2012-08-13 DIAGNOSIS — M5126 Other intervertebral disc displacement, lumbar region: Principal | ICD-10-CM | POA: Diagnosis present

## 2012-08-13 DIAGNOSIS — F17200 Nicotine dependence, unspecified, uncomplicated: Secondary | ICD-10-CM | POA: Diagnosis present

## 2012-08-13 DIAGNOSIS — M4120 Other idiopathic scoliosis, site unspecified: Secondary | ICD-10-CM | POA: Diagnosis present

## 2012-08-13 DIAGNOSIS — I10 Essential (primary) hypertension: Secondary | ICD-10-CM | POA: Diagnosis present

## 2012-08-13 DIAGNOSIS — Q762 Congenital spondylolisthesis: Secondary | ICD-10-CM

## 2012-08-13 DIAGNOSIS — M51379 Other intervertebral disc degeneration, lumbosacral region without mention of lumbar back pain or lower extremity pain: Secondary | ICD-10-CM | POA: Diagnosis present

## 2012-08-13 LAB — GLUCOSE, CAPILLARY

## 2012-08-13 SURGERY — POSTERIOR LUMBAR FUSION 3 LEVEL
Anesthesia: General | Site: Back | Wound class: Clean

## 2012-08-13 MED ORDER — ONDANSETRON HCL 4 MG/2ML IJ SOLN: 4.0000 mg | Freq: Four times a day (QID) | INTRAMUSCULAR | Status: DC | PRN

## 2012-08-13 MED ORDER — METFORMIN HCL 500 MG PO TABS
500.0000 mg | ORAL_TABLET | Freq: Every day | ORAL | Status: DC
  Administered 2012-08-13 – 2012-08-16 (×4): 500 mg via ORAL
  Filled 2012-08-13 (×5): qty 1

## 2012-08-13 MED ORDER — THROMBIN 20000 UNITS EX SOLR
CUTANEOUS | Status: DC | PRN
  Administered 2012-08-13: 09:00:00 via TOPICAL

## 2012-08-13 MED ORDER — ARTIFICIAL TEARS OP OINT
TOPICAL_OINTMENT | OPHTHALMIC | Status: DC | PRN
  Administered 2012-08-13: 1 via OPHTHALMIC

## 2012-08-13 MED ORDER — SODIUM CHLORIDE 0.9 % IJ SOLN: 3.0000 mL | INTRAMUSCULAR | Status: DC | PRN

## 2012-08-13 MED ORDER — SODIUM CHLORIDE 0.9 % IJ SOLN
3.0000 mL | Freq: Two times a day (BID) | INTRAMUSCULAR | Status: DC
  Administered 2012-08-13 – 2012-08-16 (×4): 3 mL via INTRAVENOUS

## 2012-08-13 MED ORDER — SODIUM CHLORIDE 0.9 % IV SOLN
INTRAVENOUS | Status: DC | PRN
  Administered 2012-08-13: 13:00:00 via INTRAVENOUS

## 2012-08-13 MED ORDER — SENNA 8.6 MG PO TABS
1.0000 | ORAL_TABLET | Freq: Two times a day (BID) | ORAL | Status: DC
  Administered 2012-08-13 – 2012-08-17 (×8): 8.6 mg via ORAL
  Filled 2012-08-13 (×9): qty 1

## 2012-08-13 MED ORDER — DEXTROSE 5 % IV SOLN
3.0000 g | INTRAVENOUS | Status: AC
  Filled 2012-08-13: qty 3000

## 2012-08-13 MED ORDER — DOXAZOSIN MESYLATE 4 MG PO TABS
4.0000 mg | ORAL_TABLET | Freq: Every day | ORAL | Status: DC
  Administered 2012-08-14 – 2012-08-17 (×4): 4 mg via ORAL
  Filled 2012-08-13 (×4): qty 1

## 2012-08-13 MED ORDER — CHLORTHALIDONE 25 MG PO TABS
25.0000 mg | ORAL_TABLET | Freq: Every day | ORAL | Status: DC
  Administered 2012-08-14 – 2012-08-17 (×4): 25 mg via ORAL
  Filled 2012-08-13 (×4): qty 1

## 2012-08-13 MED ORDER — LIDOCAINE-EPINEPHRINE 1 %-1:100000 IJ SOLN
INTRAMUSCULAR | Status: DC | PRN
  Administered 2012-08-13: 5 mL

## 2012-08-13 MED ORDER — BUPIVACAINE HCL (PF) 0.5 % IJ SOLN
INTRAMUSCULAR | Status: DC | PRN
  Administered 2012-08-13: 5 mL

## 2012-08-13 MED ORDER — INSULIN ASPART 100 UNIT/ML ~~LOC~~ SOLN
0.0000 [IU] | SUBCUTANEOUS | Status: DC
  Administered 2012-08-13 (×2): 2 [IU] via SUBCUTANEOUS
  Administered 2012-08-14: 3 [IU] via SUBCUTANEOUS
  Administered 2012-08-14: 2 [IU] via SUBCUTANEOUS
  Administered 2012-08-15: 1 [IU] via SUBCUTANEOUS
  Administered 2012-08-16 – 2012-08-17 (×3): 2 [IU] via SUBCUTANEOUS

## 2012-08-13 MED ORDER — ALUM & MAG HYDROXIDE-SIMETH 200-200-20 MG/5ML PO SUSP: 30.0000 mL | Freq: Four times a day (QID) | ORAL | Status: DC | PRN

## 2012-08-13 MED ORDER — DIPHENHYDRAMINE HCL 50 MG/ML IJ SOLN: 12.5000 mg | Freq: Four times a day (QID) | INTRAMUSCULAR | Status: DC | PRN

## 2012-08-13 MED ORDER — NIACIN ER (ANTIHYPERLIPIDEMIC) 500 MG PO TBCR
1500.0000 mg | EXTENDED_RELEASE_TABLET | Freq: Every day | ORAL | Status: DC
  Administered 2012-08-13 – 2012-08-16 (×4): 1500 mg via ORAL
  Filled 2012-08-13 (×5): qty 3

## 2012-08-13 MED ORDER — SODIUM CHLORIDE 0.9 % IJ SOLN: 9.0000 mL | INTRAMUSCULAR | Status: DC | PRN

## 2012-08-13 MED ORDER — GLYCOPYRROLATE 0.2 MG/ML IJ SOLN
INTRAMUSCULAR | Status: DC | PRN
Start: 2012-08-13 — End: 2012-08-13
  Administered 2012-08-13: .8 mg via INTRAVENOUS

## 2012-08-13 MED ORDER — HYDROCODONE-ACETAMINOPHEN 5-500 MG PO TABS
1.5000 | ORAL_TABLET | Freq: Four times a day (QID) | ORAL | Status: DC | PRN
  Filled 2012-08-13: qty 2

## 2012-08-13 MED ORDER — FENOFIBRATE 54 MG PO TABS
54.0000 mg | ORAL_TABLET | Freq: Every day | ORAL | Status: DC
  Administered 2012-08-13 – 2012-08-17 (×5): 54 mg via ORAL
  Filled 2012-08-13 (×5): qty 1

## 2012-08-13 MED ORDER — DEXTROSE 5 % IV SOLN
3.0000 g | Freq: Once | INTRAVENOUS | Status: AC
  Administered 2012-08-13 (×2): 3 g via INTRAVENOUS
  Filled 2012-08-13: qty 3000

## 2012-08-13 MED ORDER — EZETIMIBE-SIMVASTATIN 10-40 MG PO TABS: 1.0000 | ORAL_TABLET | Freq: Every day | ORAL | Status: DC

## 2012-08-13 MED ORDER — VITAMIN D 1000 UNITS PO CAPS: 1000.0000 [IU] | ORAL_CAPSULE | Freq: Every day | ORAL | Status: DC

## 2012-08-13 MED ORDER — EZETIMIBE 10 MG PO TABS
10.0000 mg | ORAL_TABLET | Freq: Every day | ORAL | Status: DC
  Administered 2012-08-13 – 2012-08-16 (×4): 10 mg via ORAL
  Filled 2012-08-13 (×5): qty 1

## 2012-08-13 MED ORDER — ACETAMINOPHEN 10 MG/ML IV SOLN
INTRAVENOUS | Status: AC
  Filled 2012-08-13: qty 100

## 2012-08-13 MED ORDER — AMLODIPINE BESYLATE 10 MG PO TABS
10.0000 mg | ORAL_TABLET | Freq: Every day | ORAL | Status: DC
  Administered 2012-08-14 – 2012-08-17 (×4): 10 mg via ORAL
  Filled 2012-08-13 (×4): qty 1

## 2012-08-13 MED ORDER — LIDOCAINE HCL (CARDIAC) 20 MG/ML IV SOLN
INTRAVENOUS | Status: DC | PRN
  Administered 2012-08-13: 70 mg via INTRAVENOUS

## 2012-08-13 MED ORDER — GLIPIZIDE 10 MG PO TABS
10.0000 mg | ORAL_TABLET | Freq: Every day | ORAL | Status: DC
  Administered 2012-08-14 – 2012-08-17 (×4): 10 mg via ORAL
  Filled 2012-08-13 (×6): qty 1

## 2012-08-13 MED ORDER — METFORMIN HCL 500 MG PO TABS
1000.0000 mg | ORAL_TABLET | Freq: Every day | ORAL | Status: DC
  Administered 2012-08-14 – 2012-08-17 (×4): 1000 mg via ORAL
  Filled 2012-08-13 (×6): qty 2

## 2012-08-13 MED ORDER — MIDAZOLAM HCL 5 MG/5ML IJ SOLN
INTRAMUSCULAR | Status: DC | PRN
  Administered 2012-08-13: 2 mg via INTRAVENOUS

## 2012-08-13 MED ORDER — DEXTROSE 5 % IV SOLN
INTRAVENOUS | Status: DC | PRN
  Administered 2012-08-13: 12:00:00 via INTRAVENOUS

## 2012-08-13 MED ORDER — MELOXICAM 15 MG PO TABS
15.0000 mg | ORAL_TABLET | Freq: Every day | ORAL | Status: DC
  Administered 2012-08-13 – 2012-08-17 (×5): 15 mg via ORAL
  Filled 2012-08-13 (×5): qty 1

## 2012-08-13 MED ORDER — PANTOPRAZOLE SODIUM 40 MG PO TBEC
80.0000 mg | DELAYED_RELEASE_TABLET | Freq: Every day | ORAL | Status: DC
  Administered 2012-08-14 – 2012-08-16 (×3): 80 mg via ORAL
  Filled 2012-08-13: qty 2

## 2012-08-13 MED ORDER — ACETAMINOPHEN 325 MG PO TABS: 650.0000 mg | ORAL_TABLET | ORAL | Status: DC | PRN

## 2012-08-13 MED ORDER — PROPOFOL 10 MG/ML IV BOLUS
INTRAVENOUS | Status: DC | PRN
  Administered 2012-08-13: 40 mg via INTRAVENOUS
  Administered 2012-08-13: 200 mg via INTRAVENOUS
  Administered 2012-08-13: 50 mg via INTRAVENOUS

## 2012-08-13 MED ORDER — ONDANSETRON HCL 4 MG/2ML IJ SOLN: 4.0000 mg | INTRAMUSCULAR | Status: DC | PRN

## 2012-08-13 MED ORDER — OXYCODONE-ACETAMINOPHEN 5-325 MG PO TABS
1.0000 | ORAL_TABLET | ORAL | Status: DC | PRN
  Administered 2012-08-14 – 2012-08-17 (×6): 2 via ORAL
  Filled 2012-08-13 (×6): qty 2

## 2012-08-13 MED ORDER — MORPHINE SULFATE (PF) 1 MG/ML IV SOLN
INTRAVENOUS | Status: DC
  Administered 2012-08-13: 15:00:00 via INTRAVENOUS
  Administered 2012-08-14: 3 mg via INTRAVENOUS
  Administered 2012-08-14: 1.5 mg via INTRAVENOUS
  Administered 2012-08-14: 16.5 mg via INTRAVENOUS
  Administered 2012-08-14: via INTRAVENOUS
  Administered 2012-08-14: 12 mg via INTRAVENOUS
  Administered 2012-08-14: 3 mg via INTRAVENOUS
  Administered 2012-08-14: 13:00:00 via INTRAVENOUS
  Administered 2012-08-15: 7.5 mg via INTRAVENOUS
  Administered 2012-08-15: 10.8 mg via INTRAVENOUS
  Administered 2012-08-15: 5 mg via INTRAVENOUS
  Administered 2012-08-15: 1.5 mg via INTRAVENOUS
  Administered 2012-08-15: 23:00:00 via INTRAVENOUS
  Administered 2012-08-15: 2.93 mg via INTRAVENOUS
  Administered 2012-08-15: 4.5 mg via INTRAVENOUS
  Administered 2012-08-16: 10.5 mg via INTRAVENOUS
  Filled 2012-08-13 (×5): qty 25

## 2012-08-13 MED ORDER — DOCUSATE SODIUM 100 MG PO CAPS
100.0000 mg | ORAL_CAPSULE | Freq: Two times a day (BID) | ORAL | Status: DC
  Administered 2012-08-13 – 2012-08-17 (×8): 100 mg via ORAL
  Filled 2012-08-13 (×8): qty 1

## 2012-08-13 MED ORDER — POLYETHYLENE GLYCOL 3350 17 G PO PACK: 17.0000 g | PACK | Freq: Every day | ORAL | Status: DC | PRN

## 2012-08-13 MED ORDER — ACETAMINOPHEN 10 MG/ML IV SOLN
INTRAVENOUS | Status: DC | PRN
  Administered 2012-08-13: 1000 mg via INTRAVENOUS

## 2012-08-13 MED ORDER — AMLODIPINE BESY-BENAZEPRIL HCL 10-40 MG PO CAPS: 1.0000 | ORAL_CAPSULE | Freq: Every day | ORAL | Status: DC

## 2012-08-13 MED ORDER — HYDROMORPHONE HCL PF 1 MG/ML IJ SOLN
INTRAMUSCULAR | Status: AC
  Filled 2012-08-13: qty 1

## 2012-08-13 MED ORDER — DIPHENHYDRAMINE HCL 12.5 MG/5ML PO ELIX: 12.5000 mg | ORAL_SOLUTION | Freq: Four times a day (QID) | ORAL | Status: DC | PRN

## 2012-08-13 MED ORDER — HYDROCODONE-ACETAMINOPHEN 5-325 MG PO TABS: 1.0000 | ORAL_TABLET | ORAL | Status: DC | PRN

## 2012-08-13 MED ORDER — ZOLPIDEM TARTRATE 5 MG PO TABS: 5.0000 mg | ORAL_TABLET | Freq: Every evening | ORAL | Status: DC | PRN

## 2012-08-13 MED ORDER — METHOCARBAMOL 500 MG PO TABS
500.0000 mg | ORAL_TABLET | Freq: Four times a day (QID) | ORAL | Status: DC | PRN
  Administered 2012-08-14 – 2012-08-16 (×2): 500 mg via ORAL
  Filled 2012-08-13 (×2): qty 1

## 2012-08-13 MED ORDER — FENTANYL CITRATE 0.05 MG/ML IJ SOLN
INTRAMUSCULAR | Status: DC | PRN
  Administered 2012-08-13 (×6): 50 ug via INTRAVENOUS
  Administered 2012-08-13: 100 ug via INTRAVENOUS
  Administered 2012-08-13 (×2): 50 ug via INTRAVENOUS

## 2012-08-13 MED ORDER — ROCURONIUM BROMIDE 100 MG/10ML IV SOLN
INTRAVENOUS | Status: DC | PRN
  Administered 2012-08-13: 50 mg via INTRAVENOUS

## 2012-08-13 MED ORDER — MORPHINE SULFATE (PF) 1 MG/ML IV SOLN
INTRAVENOUS | Status: AC
  Filled 2012-08-13: qty 25

## 2012-08-13 MED ORDER — LACTATED RINGERS IV SOLN
INTRAVENOUS | Status: DC | PRN
  Administered 2012-08-13 (×3): via INTRAVENOUS

## 2012-08-13 MED ORDER — PHENOL 1.4 % MT LIQD: 1.0000 | OROMUCOSAL | Status: DC | PRN

## 2012-08-13 MED ORDER — 0.9 % SODIUM CHLORIDE (POUR BTL) OPTIME
TOPICAL | Status: DC | PRN
  Administered 2012-08-13: 1000 mL

## 2012-08-13 MED ORDER — PHENYLEPHRINE HCL 10 MG/ML IJ SOLN
10.0000 mg | INTRAVENOUS | Status: DC | PRN
  Administered 2012-08-13: 25 ug/min via INTRAVENOUS

## 2012-08-13 MED ORDER — KCL IN DEXTROSE-NACL 20-5-0.45 MEQ/L-%-% IV SOLN
INTRAVENOUS | Status: DC
  Administered 2012-08-13: 18:00:00 via INTRAVENOUS
  Administered 2012-08-15: 75 mL/h via INTRAVENOUS
  Filled 2012-08-13 (×8): qty 1000

## 2012-08-13 MED ORDER — METOPROLOL SUCCINATE ER 100 MG PO TB24
100.0000 mg | ORAL_TABLET | Freq: Every day | ORAL | Status: DC
  Administered 2012-08-14 – 2012-08-17 (×4): 100 mg via ORAL
  Filled 2012-08-13 (×4): qty 1

## 2012-08-13 MED ORDER — MENTHOL 3 MG MT LOZG: 1.0000 | LOZENGE | OROMUCOSAL | Status: DC | PRN

## 2012-08-13 MED ORDER — BENAZEPRIL HCL 40 MG PO TABS
40.0000 mg | ORAL_TABLET | Freq: Every day | ORAL | Status: DC
  Administered 2012-08-14 – 2012-08-17 (×4): 40 mg via ORAL
  Filled 2012-08-13 (×4): qty 1

## 2012-08-13 MED ORDER — METFORMIN HCL 500 MG PO TABS: 500.0000 mg | ORAL_TABLET | Freq: Two times a day (BID) | ORAL | Status: DC

## 2012-08-13 MED ORDER — ATORVASTATIN CALCIUM 20 MG PO TABS
20.0000 mg | ORAL_TABLET | Freq: Every day | ORAL | Status: DC
  Administered 2012-08-13 – 2012-08-16 (×4): 20 mg via ORAL
  Filled 2012-08-13 (×5): qty 1

## 2012-08-13 MED ORDER — SODIUM CHLORIDE 0.9 % IV SOLN: 250.0000 mL | INTRAVENOUS | Status: DC

## 2012-08-13 MED ORDER — FLEET ENEMA 7-19 GM/118ML RE ENEM: 1.0000 | ENEMA | Freq: Once | RECTAL | Status: AC | PRN

## 2012-08-13 MED ORDER — ONDANSETRON HCL 4 MG/2ML IJ SOLN
INTRAMUSCULAR | Status: DC | PRN
  Administered 2012-08-13: 4 mg via INTRAVENOUS

## 2012-08-13 MED ORDER — CEFAZOLIN SODIUM 10 G IJ SOLR
3.0000 g | Freq: Three times a day (TID) | INTRAMUSCULAR | Status: AC
  Administered 2012-08-13 – 2012-08-14 (×2): 3 g via INTRAVENOUS
  Filled 2012-08-13 (×2): qty 3000

## 2012-08-13 MED ORDER — DIAZEPAM 5 MG PO TABS
5.0000 mg | ORAL_TABLET | Freq: Four times a day (QID) | ORAL | Status: DC | PRN
  Administered 2012-08-14 – 2012-08-16 (×4): 5 mg via ORAL
  Filled 2012-08-13 (×4): qty 1

## 2012-08-13 MED ORDER — NEOSTIGMINE METHYLSULFATE 1 MG/ML IJ SOLN
INTRAMUSCULAR | Status: DC | PRN
  Administered 2012-08-13: 5 mg via INTRAVENOUS

## 2012-08-13 MED ORDER — ACETAMINOPHEN 650 MG RE SUPP: 650.0000 mg | RECTAL | Status: DC | PRN

## 2012-08-13 MED ORDER — NALOXONE HCL 0.4 MG/ML IJ SOLN: 0.4000 mg | INTRAMUSCULAR | Status: DC | PRN

## 2012-08-13 MED ORDER — BISACODYL 10 MG RE SUPP: 10.0000 mg | Freq: Every day | RECTAL | Status: DC | PRN

## 2012-08-13 MED ORDER — HYDROMORPHONE HCL PF 1 MG/ML IJ SOLN
0.2500 mg | INTRAMUSCULAR | Status: DC | PRN
  Administered 2012-08-13 (×3): 0.5 mg via INTRAVENOUS

## 2012-08-13 MED ORDER — VITAMIN D3 25 MCG (1000 UNIT) PO TABS
1000.0000 [IU] | ORAL_TABLET | Freq: Every day | ORAL | Status: DC
  Administered 2012-08-14 – 2012-08-17 (×4): 1000 [IU] via ORAL
  Filled 2012-08-13 (×4): qty 1

## 2012-08-13 MED ORDER — PHENYLEPHRINE HCL 10 MG/ML IJ SOLN
INTRAMUSCULAR | Status: DC | PRN
  Administered 2012-08-13: 40 ug via INTRAVENOUS
  Administered 2012-08-13: 80 ug via INTRAVENOUS

## 2012-08-13 MED ORDER — VECURONIUM BROMIDE 10 MG IV SOLR
INTRAVENOUS | Status: DC | PRN
  Administered 2012-08-13: 3 mg via INTRAVENOUS
  Administered 2012-08-13 (×4): 1 mg via INTRAVENOUS
  Administered 2012-08-13 (×2): 2 mg via INTRAVENOUS

## 2012-08-13 SURGICAL SUPPLY — 83 items
ADH SKN CLS APL DERMABOND .7 (GAUZE/BANDAGES/DRESSINGS) ×1
APL SKNCLS STERI-STRIP NONHPOA (GAUZE/BANDAGES/DRESSINGS) ×2
BAG DECANTER FOR FLEXI CONT (MISCELLANEOUS) ×1 IMPLANT
BENZOIN TINCTURE PRP APPL 2/3 (GAUZE/BANDAGES/DRESSINGS) ×2 IMPLANT
BLADE SURG ROTATE 9660 (MISCELLANEOUS) IMPLANT
BONE VOID FILLER STRIP 10CC (Bone Implant) ×2 IMPLANT
BUR MATCHSTICK NEURO 3.0 LAGG (BURR) ×2 IMPLANT
BUR PRECISION FLUTE 5.0 (BURR) ×2 IMPLANT
CAGE 13MM (Cage) ×4 IMPLANT
CANISTER SUCTION 2500CC (MISCELLANEOUS) ×2 IMPLANT
CLOTH BEACON ORANGE TIMEOUT ST (SAFETY) ×2 IMPLANT
CONT SPEC 4OZ CLIKSEAL STRL BL (MISCELLANEOUS) ×4 IMPLANT
COVER BACK TABLE 24X17X13 BIG (DRAPES) IMPLANT
COVER TABLE BACK 60X90 (DRAPES) ×2 IMPLANT
DERMABOND ADVANCED (GAUZE/BANDAGES/DRESSINGS) ×1
DERMABOND ADVANCED .7 DNX12 (GAUZE/BANDAGES/DRESSINGS) ×1 IMPLANT
DRAPE C-ARM 42X72 X-RAY (DRAPES) ×4 IMPLANT
DRAPE LAPAROTOMY 100X72X124 (DRAPES) ×2 IMPLANT
DRAPE POUCH INSTRU U-SHP 10X18 (DRAPES) ×2 IMPLANT
DRAPE SURG 17X23 STRL (DRAPES) ×2 IMPLANT
DRESSING TELFA 8X3 (GAUZE/BANDAGES/DRESSINGS) IMPLANT
DURAPREP 26ML APPLICATOR (WOUND CARE) ×2 IMPLANT
ELECT BLADE 4.0 EZ CLEAN MEGAD (MISCELLANEOUS) ×2
ELECT REM PT RETURN 9FT ADLT (ELECTROSURGICAL) ×2
ELECTRODE BLDE 4.0 EZ CLN MEGD (MISCELLANEOUS) IMPLANT
ELECTRODE REM PT RTRN 9FT ADLT (ELECTROSURGICAL) ×1 IMPLANT
EVACUATOR 1/8 PVC DRAIN (DRAIN) ×1 IMPLANT
GAUZE SPONGE 4X4 16PLY XRAY LF (GAUZE/BANDAGES/DRESSINGS) ×1 IMPLANT
GLOVE BIO SURGEON STRL SZ8 (GLOVE) ×4 IMPLANT
GLOVE BIOGEL PI IND STRL 7.5 (GLOVE) IMPLANT
GLOVE BIOGEL PI IND STRL 8 (GLOVE) ×2 IMPLANT
GLOVE BIOGEL PI IND STRL 8.5 (GLOVE) ×2 IMPLANT
GLOVE BIOGEL PI INDICATOR 7.5 (GLOVE) ×2
GLOVE BIOGEL PI INDICATOR 8 (GLOVE) ×5
GLOVE BIOGEL PI INDICATOR 8.5 (GLOVE) ×2
GLOVE ECLIPSE 7.5 STRL STRAW (GLOVE) ×6 IMPLANT
GLOVE ECLIPSE 8.0 STRL XLNG CF (GLOVE) ×4 IMPLANT
GLOVE EXAM NITRILE LRG STRL (GLOVE) ×1 IMPLANT
GLOVE EXAM NITRILE MD LF STRL (GLOVE) IMPLANT
GLOVE EXAM NITRILE XL STR (GLOVE) IMPLANT
GLOVE EXAM NITRILE XS STR PU (GLOVE) IMPLANT
GOWN BRE IMP SLV AUR LG STRL (GOWN DISPOSABLE) IMPLANT
GOWN BRE IMP SLV AUR XL STRL (GOWN DISPOSABLE) ×6 IMPLANT
GOWN STRL REIN 2XL LVL4 (GOWN DISPOSABLE) ×6 IMPLANT
KIT BASIN OR (CUSTOM PROCEDURE TRAY) ×2 IMPLANT
KIT INFUSE MEDIUM (Orthopedic Implant) ×1 IMPLANT
KIT POSITION SURG JACKSON T1 (MISCELLANEOUS) ×2 IMPLANT
KIT ROOM TURNOVER OR (KITS) ×2 IMPLANT
MILL MEDIUM DISP (BLADE) ×2 IMPLANT
NDL HYPO 25X1 1.5 SAFETY (NEEDLE) ×1 IMPLANT
NDL SPNL 18GX3.5 QUINCKE PK (NEEDLE) IMPLANT
NEEDLE HYPO 25X1 1.5 SAFETY (NEEDLE) ×2 IMPLANT
NEEDLE SPNL 18GX3.5 QUINCKE PK (NEEDLE) IMPLANT
NS IRRIG 1000ML POUR BTL (IV SOLUTION) ×2 IMPLANT
PACK LAMINECTOMY NEURO (CUSTOM PROCEDURE TRAY) ×2 IMPLANT
PAD ARMBOARD 7.5X6 YLW CONV (MISCELLANEOUS) ×6 IMPLANT
PATTIES SURGICAL .5 X.5 (GAUZE/BANDAGES/DRESSINGS) IMPLANT
PATTIES SURGICAL .5 X1 (DISPOSABLE) IMPLANT
PATTIES SURGICAL 1X1 (DISPOSABLE) IMPLANT
PEEK PLIF NOVEL 9X25X12 (Peek) ×2 IMPLANT
ROD 100MM (Rod) ×2 IMPLANT
SCREW 45MM (Screw) ×2 IMPLANT
SCREW 50MM (Screw) ×2 IMPLANT
SCREW POLYAX 6.5X45MM (Screw) ×4 IMPLANT
SCREW SET SPINAL STD HEXALOBE (Screw) ×8 IMPLANT
SPONGE GAUZE 4X4 12PLY (GAUZE/BANDAGES/DRESSINGS) ×2 IMPLANT
SPONGE LAP 4X18 X RAY DECT (DISPOSABLE) ×1 IMPLANT
SPONGE SURGIFOAM ABS GEL 100 (HEMOSTASIS) ×2 IMPLANT
STAPLER SKIN PROX WIDE 3.9 (STAPLE) ×1 IMPLANT
STRIP CLOSURE SKIN 1/2X4 (GAUZE/BANDAGES/DRESSINGS) ×3 IMPLANT
SUT VIC AB 1 CT1 18XBRD ANBCTR (SUTURE) ×2 IMPLANT
SUT VIC AB 1 CT1 8-18 (SUTURE) ×4
SUT VIC AB 2-0 CT1 18 (SUTURE) ×5 IMPLANT
SUT VIC AB 3-0 SH 8-18 (SUTURE) ×4 IMPLANT
SYR 20CC LL (SYRINGE) ×2 IMPLANT
SYR 3ML LL SCALE MARK (SYRINGE) ×9 IMPLANT
SYR 5ML LL (SYRINGE) ×1 IMPLANT
TAPE CLOTH SURG 4X10 WHT LF (GAUZE/BANDAGES/DRESSINGS) ×1 IMPLANT
TOWEL OR 17X24 6PK STRL BLUE (TOWEL DISPOSABLE) ×2 IMPLANT
TOWEL OR 17X26 10 PK STRL BLUE (TOWEL DISPOSABLE) ×2 IMPLANT
TRAP SPECIMEN MUCOUS 40CC (MISCELLANEOUS) ×2 IMPLANT
TRAY FOLEY CATH 14FRSI W/METER (CATHETERS) ×2 IMPLANT
WATER STERILE IRR 1000ML POUR (IV SOLUTION) ×2 IMPLANT

## 2012-08-13 NOTE — H&P (Signed)
Sean [name]  #[id number] DOB:  19-Oct-1950  06/29/2012:  Sean [name] returns today.  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 He also has a lateral disc herniation at L3-4 which is causing left L3 nerve compression in the extraforaminal space and he has marked foraminal stenosis at L5-S1 with severe nerve root compression within the neural foramen.  This correlated with his radiographs and show that he has Grade II spondylolisthesis of L4 on L5 of 12.4 mm. in the neutral and flexed positions.  He continues to have severe right greater than left lower extremity pain.   He wishes to have surgery and says he cannot continue to live with his current level of discomfort and wants to have treatment for this.  To this end, I have recommended that he undergo re-do decompression at the L4-5 level with decompression and fusion L3 through S1 levels.  He wishes to proceed with surgery and this will be arranged for in the near future. Risks and benefits were discussed in detail.  We went over models and specifics of the surgery.  I spent in excess of 25 minutes with the patient.  I fitted him for a lumbosacral orthosis. He is to be cleared by a cardiologist prior to the surgery.  Once again I talked with him about the importance of stopping smoking and weight management.           Danae Orleans. 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  He also then underwent re-do laminectomy in 2008 also by me.  He currently complains of left shoulder and right hip pain. He says he is having for the last three months left shoulder and arm pain which have been intermittent and right hip, which  he says is no better since he had a total hip replacement performed in 12/2010.  He has been out of work often lately due to pain.  He had an MRI a year ago, but nothing more recently than that.  He engaged in physical therapy after a total hip replacement in 2012.  He says his job has changed and since that time he has had increasing pain. He says his prior job was sedentary with occasional walking. He is still an Midwife, but helping in other areas as well with lifting, bending, and pulling greater than 100 lbs. on rollers.  He says that he is having a lot of problems with his right hip as a result of that.    REVIEW OF SYSTEMS:   A detailed Review of Systems sheet was reviewed with the patient.  Pertinent positives include under musculoskeletal - he notes back pain, arm pain and leg pain, otherwise unremarkable.  All other systems are negative; this includes Constitutional symptoms, Eyes, Cardiovascular, Ears, nose, mouth, throat, Endocrine, Respiratory, Gastrointestinal, Genitourinary, Integumentary & Breast, Neurologic, Psychiatric, Hematologic/Lymphatic, Allergic/Immunologic.    PAST MEDICAL HISTORY:      Current Medical Conditions:    He has a history of hypertension and noninsulin dependent diabetes mellitus.      Prior Operations and Hospitalizations:   Additionally, he has had a hernia repair x two  in 11/1995, left hip replacement in 2005, OD cataract 2/20012, OS cataract 08/2010, right hip total hip replacement in 12/2010 by Dr. Cleophas Dunker.      Medications and Allergies:  Current medications - Meloxicam 15 mg. qam, Lotrel 10/40 mg. qam, Chlorthalidone 25 mg. qam, Metoprolol 100 mg. qam, Nexium 40 mg. qam, Metformin 500 mg. two qam, Doxazosin 4 mg. qam, Vitamin D 1000 I.U. qam, Trilipix 135 mg. qam, Glipizide 10 mg. qpm, Lipitor 40 mg. qpm, Metformin 500 mg. qpm, Niaspan 500 mg. three q.h.s., Aspirin q.h.s., Hydrocodone 7.5/750 prn, Methocarbamol 500 mg. prn, and Viagra 100 mg. prn.  No known drug  allergies.        Height and Weight:     He is currently 6'1" tall, 290 lbs.  BMI is 38/5.   FAMILY HISTORY:    His mother died at age 12 and his father died at ge 6.    SOCIAL HISTORY:    He is a two-pack per day smoker of cigarettes.  He is a social drinker of alcoholic beverages.  He denies any history of drug abuse.    DIAGNOSTIC STUDIES:   Plain radiographs were obtained in the office today which show that he has spondylolisthesis Grade II of L4 on L5, 12.4 mm. neutral alignment, 12.4 mm. on flexion, and 9.8 mm. on extension.    PHYSICAL EXAMINATION:      General Appearance:   On examination today, Sean [name] is a pleasant and cooperative man in no acute distress.      Blood Pressure, Pulse:     His blood pressure is 122/78.  Heart rate is 74 and regular.  Respiratory rate is 18.      HEENT - normocephalic, atraumatic.  The pupils are equal, round and reactive to light.  The extraocular muscles are intact.  Sclerae - white.  Conjunctiva - pink.  Oropharynx benign.  Uvula midline.     Neck - there are no masses, meningismus, deformities, tracheal deviation, jugular vein distention or carotid bruits.  There is normal cervical range of motion.  Negative Spurlings' maneuver to either side.  Lhermitte's sign is not present with axial compression.  He has pain in his left shoulder blade radiating into his left arm.      Respiratory - there is normal respiratory effort with good intercostal function.  Lungs are clear to auscultation.  There are no rales, rhonchi or wheezes.      Cardiovascular - the heart has regular rate and rhythm to auscultation.  No murmurs are appreciated.  There is no extremity edema, cyanosis or clubbing.  There are palpable pedal pulses.      Abdomen - obese, soft, nontender, no hepatosplenomegaly appreciated or masses.  There are active bowel sounds.  No guarding or rebound.      Musculoskeletal Examination - he is able to stand on his heels and toes and bend to touch  his toes.      NEUROLOGICAL EXAMINATION: The patient is oriented to time, person and place and has good recall of both recent and remote memory with normal attention span and concentration.  The patient speaks with clear and fluent speech and exhibits normal language function and appropriate fund of knowledge.      Cranial Nerve Examination - pupils are equal, round and reactive to light.  Extraocular movements are full.  Visual fields are full to confrontational testing.  Facial sensation and facial movement are symmetric and intact.  Hearing is intact to finger rub.  Palate is upgoing.  Shoulder shrug is symmetric.  Tongue protrudes in the midline.      Motor Examination - motor strength is 5/5 in the bilateral deltoids, biceps, triceps, handgrips, wrist extensors, interosseous.  In the lower extremities motor strength is 5/5 in hip flexion, extension, quadriceps, hamstrings, plantar flexion, dorsiflexion, 4/5 left extensor hallucis longus strength, 4+/5 right extensor hallucis longus strength, 4/5 right hip abductor strength, and 4+/5 left hip abductor strength.     Sensory Examination - he has decreased pin sensation in both of his lower extremities to the level of his mid-shin in a stocking distribution, suggestive of diabetic neuropathy.      Deep Tendon Reflexes - 2 in the biceps, triceps, and brachioradialis, 2 in the knees, 2 in the ankles.  The great toes are downgoing to plantar stimulation.      Cerebellar Examination - normal coordination in upper and lower extremities and normal rapid alternating movements.  Romberg test is negative.    IMPRESSION AND RECOMMENDATIONS: Sean [name] is a 62 year old man with a mobile spondylolisthesis Grade II of L4 on L5, with bilateral lower extremity and right hip pain. He has weakness in his right lower extremity greater than left lower extremity, consistent with an L5 radiculopathy.  I have recommended that we obtain an MRI of his lumbar spine.  He  says that he is not able to work and wants to stay out of work until he gets this matter resolved. He will come back to see me after the MRI has been done. I will make further recommendations at that point.    [facility]    Danae Orleans. Venetia Maxon, M.D.

## 2012-08-13 NOTE — Brief Op Note (Signed)
08/13/2012  1:39 PM  PATIENT:  Sean [name]  62 y.o. male  PRE-OPERATIVE DIAGNOSIS:  Lumbar hnp without myelopathy,Lumbar spondylolisthesis recurrent lumbar stenosis, scoliosis, lumbar radiculopathy L 34, L 45, L 5S1  POST-OPERATIVE DIAGNOSIS:   Lumbar hnp without myelopathy,Lumbar spondylolisthesis recurrent lumbar stenosis, scoliosis, lumbar radiculopathy L 34, L 45, L 5S1  PROCEDURE:  Procedure(s) with comments: Redo Laminectomy/Lumbar three to four, Lumbar Four to Five, Lumbar Five to Sacral One Fusion/Pedicle screws/Posterolateral arthrodesis (N/A) - POSTERIOR LUMBAR FUSION 3 LEVEL  With PEEK cages, autograft, pedicle screws posterolateral arthrodesis  SURGEON:  Surgeon(s) and Role:    * Maeola Harman, MD - Primary    * Reinaldo Meeker, MD - Assisting  PHYSICIAN ASSISTANT:   ASSISTANTS: Poteat, RN   ANESTHESIA:   general  EBL:  Total I/O In: 2205 [I.V.:2050; Blood:155] Out: 810 [Urine:360; Blood:450]  BLOOD ADMINISTERED:none and 155 CC PRBC  DRAINS: (Medium) Hemovact drain(s) in the epidural space with  Suction Open   LOCAL MEDICATIONS USED:  MARCAINE     SPECIMEN:  No Specimen  DISPOSITION OF SPECIMEN:  N/A  COUNTS:  YES  TOURNIQUET:  * No tourniquets in log *  DICTATION: DICTATION: Patient is 62 year old man with lumbar scoliosis and a disc herniation L 3/4 and L 4/5 with severe spinal stenosis and a long history of severe back and bilateral leg pain, right greater than left. He has severe right L4 and L 5  Radiculopathies. He has had prior decompressive laminectomy for stenosis L4 Sacral levels. It was elected to take him to surgery for redo decompression and fusion from L34 through L5/S1 levels.   Procedure: Patient was placed in a prone position on the Scenic table after smooth and uncomplicated induction of general endotracheal anesthesia. His low back was prepped and draped in usual sterile fashion with Betadine scrub and DuraPrep. Area of incision was  infiltrated with local lidocaine mixed with marcaine. Incision was made through previous scar  to the lumbodorsal fascia was incised and exposure was performed of the L3-S1 spinous processes laminae facet joint and transverse processes. Intraoperative x-ray was obtained which confirmed correct orientation with marker probes at L3 through sacral levels. A total laminectomy of L3 through S1 levels was performed with disarticulation of the facet joints and thorough decompression was performed of both L3,  L4, L5, S1   S1 nerve roots along with the common dural tube. Great care was taken dissecting through scar tissue and previous laminectomy defect. Decompression was greater than would be typical for PLIF. A thorough discectomy with removal  Disc fragments at L3/4 and L4/5 level was performed. A thorough discectomy was then performed on the left with preparation of the endplates for grafting a trial spacer was placed this level and a thorough discectomy was performed on the right as well at each level.  After trial sizing and utilization of sequential shavers, interspaces were packed with BMP, autograft and PEEK cages with NexOss Bone graft extender.  Bone autograft was packed within the interspace bilaterally along with medium BMP kit and NexOss bone graft extender. Bilateral median 13 mm peek cages were packed with BMP and extender and was inserted the interspace and countersunk appropriately at the L 5/S1 level along with 15 cc of autograft, 13 mm cages at L 4/5 with 12 cc of autograft, and 12 mm cages at L3/4 levels and 10 cc of autograft . There was good correction of scoliotic curvature. The posterolateral region was extensively decorticated and pedicle probes were  placed at L3 through S1 bilaterally. Intraoperative fluoroscopy confirmed correct orientationin the AP and lateral plane. 45 x 7.5 mm pedicle screws were placed at S1 bilaterally and 45 x 6.5 mm screws placed at L 4, L 5  bilaterally and 6.5 x 50mm  screws were placed at L 3 bilaterally. Final x-rays demonstrated well-positioned interbody grafts and pedicle screw fixation. A 100 mm lordotic rod was placed on the right and a 100 mm rod was placed on the left locked down in situ and the posterolateral region was packed with the remaining NexOss and bone graft extender on the leftt with autograft and  15 cc of autograft on the right. The wound was irrigated. A medium Hemovac drain was placed through a separate stab incision. Fascia was closed with 1 Vicryl sutures skin edges were reapproximated 2 and 3-0 Vicryl sutures. The wound was dressed with benzoin Steri-Strips Telfa gauze and tape the patient was extubated in the operating room and taken to recovery in stable satisfactory condition he tolerated traction well counts were correct at the end of the case.   PLAN OF CARE: Admit to inpatient   PATIENT DISPOSITION:  PACU - hemodynamically stable.   Delay start of Pharmacological VTE agent (>24hrs) due to surgical blood loss or risk of bleeding: yes

## 2012-08-13 NOTE — Anesthesia Postprocedure Evaluation (Signed)
  Anesthesia Post-op Note  Patient: Sean Rupe Rice2  Procedure(s) Performed: Procedure(s) with comments: Redo Laminectomy/Lumbar three to four, Lumbar Four to Five, Lumbar Five to Sacral One Fusion/Pedicle screws/Posterolateral arthrodesis (N/A) - POSTERIOR LUMBAR FUSION 3 LEVEL  Patient Location: PACU  Anesthesia Type:General  Level of Consciousness: awake  Airway and Oxygen Therapy: Patient Spontanous Breathing  Post-op Pain: mild  Post-op Assessment: Post-op Vital signs reviewed  Post-op Vital Signs: Reviewed  Complications: No apparent anesthesia complications

## 2012-08-13 NOTE — Transfer of Care (Signed)
Immediate Anesthesia Transfer of Care Note  Patient: Sean [name]  Procedure(s) Performed: Procedure(s) with comments: Redo Laminectomy/Lumbar three to four, Lumbar Four to Five, Lumbar Five to Sacral One Fusion/Pedicle screws/Posterolateral arthrodesis (N/A) - POSTERIOR LUMBAR FUSION 3 LEVEL  Patient Location: PACU  Anesthesia Type:General  Level of Consciousness: awake and alert   Airway & Oxygen Therapy: Patient Spontanous Breathing and Patient connected to nasal cannula oxygen  Post-op Assessment: Report given to PACU RN, Post -op Vital signs reviewed and stable and Patient moving all extremities X 4  Post vital signs: Reviewed and stable  Complications: No apparent anesthesia complications

## 2012-08-13 NOTE — Op Note (Signed)
08/13/2012  1:39 PM  PATIENT:  Sean [name]  62 y.o. male  PRE-OPERATIVE DIAGNOSIS:  Lumbar hnp without myelopathy,Lumbar spondylolisthesis recurrent lumbar stenosis, scoliosis, lumbar radiculopathy L 34, L 45, L 5S1  POST-OPERATIVE DIAGNOSIS:   Lumbar hnp without myelopathy,Lumbar spondylolisthesis recurrent lumbar stenosis, scoliosis, lumbar radiculopathy L 34, L 45, L 5S1  PROCEDURE:  Procedure(s) with comments: Redo Laminectomy/Lumbar three to four, Lumbar Four to Five, Lumbar Five to Sacral One Fusion/Pedicle screws/Posterolateral arthrodesis (N/A) - POSTERIOR LUMBAR FUSION 3 LEVEL  With PEEK cages, autograft, pedicle screws posterolateral arthrodesis  SURGEON:  Surgeon(s) and Role:    * Keani Gotcher, MD - Primary    * Randy O Kritzer, MD - Assisting  PHYSICIAN ASSISTANT:   ASSISTANTS: Poteat, RN   ANESTHESIA:   general  EBL:  Total I/O In: 2205 [I.V.:2050; Blood:155] Out: 810 [Urine:360; Blood:450]  BLOOD ADMINISTERED:none and 155 CC PRBC  DRAINS: (Medium) Hemovact drain(s) in the epidural space with  Suction Open   LOCAL MEDICATIONS USED:  MARCAINE     SPECIMEN:  No Specimen  DISPOSITION OF SPECIMEN:  N/A  COUNTS:  YES  TOURNIQUET:  * No tourniquets in log *  DICTATION: DICTATION: Patient is 62-year-old man with lumbar scoliosis and a disc herniation L 3/4 and L 4/5 with severe spinal stenosis and a long history of severe back and bilateral leg pain, right greater than left. He has severe right L4 and L 5  Radiculopathies. He has had prior decompressive laminectomy for stenosis L4 Sacral levels. It was elected to take him to surgery for redo decompression and fusion from L34 through L5/S1 levels.   Procedure: Patient was placed in a prone position on the Jackson table after smooth and uncomplicated induction of general endotracheal anesthesia. His low back was prepped and draped in usual sterile fashion with Betadine scrub and DuraPrep. Area of incision was  infiltrated with local lidocaine mixed with marcaine. Incision was made through previous scar  to the lumbodorsal fascia was incised and exposure was performed of the L3-S1 spinous processes laminae facet joint and transverse processes. Intraoperative x-ray was obtained which confirmed correct orientation with marker probes at L3 through sacral levels. A total laminectomy of L3 through S1 levels was performed with disarticulation of the facet joints and thorough decompression was performed of both L3,  L4, L5, S1   S1 nerve roots along with the common dural tube. Great care was taken dissecting through scar tissue and previous laminectomy defect. Decompression was greater than would be typical for PLIF. A thorough discectomy with removal  Disc fragments at L3/4 and L4/5 level was performed. A thorough discectomy was then performed on the left with preparation of the endplates for grafting a trial spacer was placed this level and a thorough discectomy was performed on the right as well at each level.  After trial sizing and utilization of sequential shavers, interspaces were packed with BMP, autograft and PEEK cages with NexOss Bone graft extender.  Bone autograft was packed within the interspace bilaterally along with medium BMP kit and NexOss bone graft extender. Bilateral median 13 mm peek cages were packed with BMP and extender and was inserted the interspace and countersunk appropriately at the L 5/S1 level along with 15 cc of autograft, 13 mm cages at L 4/5 with 12 cc of autograft, and 12 mm cages at L3/4 levels and 10 cc of autograft . There was good correction of scoliotic curvature. The posterolateral region was extensively decorticated and pedicle probes were   placed at L3 through S1 bilaterally. Intraoperative fluoroscopy confirmed correct orientationin the AP and lateral plane. 45 x 7.5 mm pedicle screws were placed at S1 bilaterally and 45 x 6.5 mm screws placed at L 4, L 5  bilaterally and 6.5 x 50mm  screws were placed at L 3 bilaterally. Final x-rays demonstrated well-positioned interbody grafts and pedicle screw fixation. A 100 mm lordotic rod was placed on the right and a 100 mm rod was placed on the left locked down in situ and the posterolateral region was packed with the remaining NexOss and bone graft extender on the leftt with autograft and  15 cc of autograft on the right. The wound was irrigated. A medium Hemovac drain was placed through a separate stab incision. Fascia was closed with 1 Vicryl sutures skin edges were reapproximated 2 and 3-0 Vicryl sutures. The wound was dressed with benzoin Steri-Strips Telfa gauze and tape the patient was extubated in the operating room and taken to recovery in stable satisfactory condition he tolerated traction well counts were correct at the end of the case.   PLAN OF CARE: Admit to inpatient   PATIENT DISPOSITION:  PACU - hemodynamically stable.   Delay start of Pharmacological VTE agent (>24hrs) due to surgical blood loss or risk of bleeding: yes  

## 2012-08-13 NOTE — Anesthesia Preprocedure Evaluation (Addendum)
Anesthesia Evaluation  Patient identified by MRN, date of birth, ID band Patient awake    Reviewed: Allergy & Precautions, H&P , NPO status , Patient's Chart, lab work & pertinent test results  Airway Mallampati: II      Dental   Pulmonary shortness of breath and with exertion,  breath sounds clear to auscultation        Cardiovascular hypertension, Pt. on medications Rhythm:Regular Rate:Normal     Neuro/Psych negative neurological ROS     GI/Hepatic Neg liver ROS, hiatal hernia, GERD-  ,  Endo/Other  diabetes  Renal/GU negative Renal ROS     Musculoskeletal   Abdominal   Peds  Hematology negative hematology ROS (+)   Anesthesia Other Findings   Reproductive/Obstetrics                          Anesthesia Physical Anesthesia Plan  ASA: III  Anesthesia Plan: General   Post-op Pain Management:    Induction: Intravenous  Airway Management Planned: Oral ETT  Additional Equipment:   Intra-op Plan:   Post-operative Plan:   Informed Consent:   Dental advisory given  Plan Discussed with: CRNA, Anesthesiologist and Surgeon  Anesthesia Plan Comments:         Anesthesia Quick Evaluation

## 2012-08-13 NOTE — Anesthesia Procedure Notes (Addendum)
Procedure Name: Intubation Date/Time: 08/13/2012 7:38 AM Performed by: Margaree Mackintosh Pre-anesthesia Checklist: Patient identified, Timeout performed, Emergency Drugs available, Suction available and Patient being monitored Patient Re-evaluated:Patient Re-evaluated prior to inductionOxygen Delivery Method: Circle system utilized Preoxygenation: Pre-oxygenation with 100% oxygen Intubation Type: IV induction Ventilation: Mask ventilation without difficulty and Oral airway inserted - appropriate to patient size Laryngoscope Size: Mac and 3 Grade View: Grade II Tube type: Oral Tube size: 7.5 mm Number of attempts: 1 Airway Equipment and Method: Stylet Placement Confirmation: ETT inserted through vocal cords under direct vision,  positive ETCO2 and breath sounds checked- equal and bilateral Secured at: 23 cm Tube secured with: Tape Dental Injury: Teeth and Oropharynx as per pre-operative assessment    Procedure Name: Intubation Date/Time: 08/13/2012 7:46 AM Performed by: Margaree Mackintosh Pre-anesthesia Checklist: Patient identified, Emergency Drugs available, Patient being monitored, Suction available and Timeout performed Oxygen Delivery Method: Circle system utilized Preoxygenation: Pre-oxygenation with 100% oxygen Intubation Type: Inhalational induction with existing ETT Laryngoscope Size: Mac and 3 Grade View: Grade II Tube type: Oral Tube size: 8.0 mm Number of attempts: 1 Airway Equipment and Method: Stylet Placement Confirmation: ETT inserted through vocal cords under direct vision,  positive ETCO2 and breath sounds checked- equal and bilateral Secured at: 23 cm Tube secured with: Tape Dental Injury: Teeth and Oropharynx as per pre-operative assessment  Comments: New ETT inserted due to cuff leak in previous ETT.

## 2012-08-13 NOTE — Progress Notes (Signed)
Awake, alert, conversant.  Full strength both legs.  Doing well.  

## 2012-08-13 NOTE — Preoperative (Signed)
Beta Blockers   Reason not to administer Beta Blockers:Not Applicable, took metoprolol this am 

## 2012-08-14 LAB — GLUCOSE, CAPILLARY
Glucose-Capillary: 143 mg/dL — ABNORMAL HIGH (ref 70–99)
Glucose-Capillary: 148 mg/dL — ABNORMAL HIGH (ref 70–99)
Glucose-Capillary: 138 mg/dL — ABNORMAL HIGH (ref 70–99)

## 2012-08-14 NOTE — Evaluation (Signed)
Occupational Therapy Evaluation Patient Details Name: Sean [name] MRN: [id number] DOB: Mar 23, 1951 Today's Date: 08/14/2012 Time: 4098-1191 OT Time Calculation (min): 36 min  OT Assessment / Plan / Recommendation Clinical Impression  Pt s/p re-do lami/ L3-S1 fusion thus affecting PLOF. Will benefit from acute OT services to address below problem list.  Pt wants to go home but therapies concerned since he will only have PRN sup/assist from son.  Will recommend HHOT pending progress.    OT Assessment  Patient needs continued OT Services    Follow Up Recommendations  Home health OT;Supervision/Assistance - 24 hour    Barriers to Discharge Decreased caregiver support Son able to provide intermittent assist.  Equipment Recommendations  None recommended by OT    Recommendations for Other Services    Frequency  Min 2X/week    Precautions / Restrictions Precautions Precautions: Back;Fall Precaution Booklet Issued: Yes (comment) Precaution Comments: instructed pt. in back precautions and log rolling and provided handout Required Braces or Orthoses: Spinal Brace Spinal Brace: Lumbar corset;Other (comment);Applied in supine position;Applied in sitting position (ordered for in bed, pt. unable so applied in sitting) Spinal Brace Comments: pt. unable to roll adequately for applying brace in bed as ordered. Restrictions Weight Bearing Restrictions: No   Pertinent Vitals/Pain See vitals    ADL  Eating/Feeding: Performed;Set up Where Assessed - Eating/Feeding: Edge of bed Upper Body Bathing: Simulated;Minimal assistance Where Assessed - Upper Body Bathing: Unsupported sitting Lower Body Bathing: Simulated;Maximal assistance Where Assessed - Lower Body Bathing: Supported sit to stand Upper Body Dressing: Performed;Minimal assistance Where Assessed - Upper Body Dressing: Unsupported sitting Lower Body Dressing: Performed;+1 Total assistance Where Assessed - Lower Body Dressing: Supported  sit to stand Toilet Transfer: Simulated;+2 Total assistance Toilet Transfer: Patient Percentage: 60% Statistician Method: Sit to Barista:  (bed ambulating to chair) Equipment Used: Back brace;Gait belt Transfers/Ambulation Related to ADLs: +2 assist for sit<>stand but then able to ambulate in room with min assist and RW. ADL Comments: Pt with significant pain during bed mobility and transfers but still very willing to work with OT.      OT Diagnosis: Acute pain;Generalized weakness  OT Problem List: Decreased strength;Decreased activity tolerance;Decreased safety awareness;Decreased knowledge of use of DME or AE;Decreased knowledge of precautions;Pain;Obesity OT Treatment Interventions: Self-care/ADL training;DME and/or AE instruction;Therapeutic activities;Patient/family education   OT Goals Acute Rehab OT Goals OT Goal Formulation: With patient Time For Goal Achievement: 08/21/12 Potential to Achieve Goals: Good ADL Goals Pt Will Perform Lower Body Bathing: with modified independence;Sit to stand from chair;Sit to stand from bed;with adaptive equipment ADL Goal: Lower Body Bathing - Progress: Goal set today Pt Will Perform Lower Body Dressing: with modified independence;Sit to stand from chair;Sit to stand from bed;with adaptive equipment ADL Goal: Lower Body Dressing - Progress: Goal set today Pt Will Transfer to Toilet: with modified independence;Ambulation;with DME;Comfort height toilet;Maintaining back safety precautions ADL Goal: Toilet Transfer - Progress: Goal set today Pt Will Perform Toileting - Clothing Manipulation: with modified independence;Standing;Sitting on 3-in-1 or toilet ADL Goal: Toileting - Clothing Manipulation - Progress: Goal set today Miscellaneous OT Goals Miscellaneous OT Goal #1: Pt will perform bed mobility with mod I as precursor for EOB ADLs. OT Goal: Miscellaneous Goal #1 - Progress: Goal set today Miscellaneous OT Goal #2: Pt  will independently verbalize and generalize 3/3 back precautions during all ADLs. OT Goal: Miscellaneous Goal #2 - Progress: Goal set today Miscellaneous OT Goal #3: Pt will don/doff back brace at mod I  level as precursor for functional mobility. OT Goal: Miscellaneous Goal #3 - Progress: Goal set today  Visit Information  Last OT Received On: 08/14/12 Assistance Needed: +2 PT/OT Co-Evaluation/Treatment: Yes    Subjective Data  Patient Stated Goal: to go home   Prior Functioning     Home Living Lives With: Son Available Help at Discharge: Available PRN/intermittently;Family (son is Archivist and is in and out of the house) Type of Home: House Home Access: Stairs to enter Secretary/administrator of Steps: 4 Entrance Stairs-Rails: Right;Left;Can reach both Home Layout: One level Bathroom Shower/Tub: Tub/shower unit;Door [employer] Toilet: Standard Home Adaptive Equipment: Straight cane;Shower chair with back;Bedside commode/3-in-1;Walker - standard Prior Function Level of Independence: Independent with assistive device(s) (uses straight cane outdoors only) Able to Take Stairs?: Yes Driving: Yes Vocation: Retired Musician: No difficulties         Vision/Perception     Copywriter, advertising Overall Cognitive Status: Appears within functional limits for tasks assessed/performed Arousal/Alertness: Awake/alert Orientation Level: Appears intact for tasks assessed Behavior During Session: [facility] for tasks performed    Extremity/Trunk Assessment Right Upper Extremity Assessment RUE ROM/Strength/Tone: [facility] for tasks assessed Left Upper Extremity Assessment LUE ROM/Strength/Tone: WFL for tasks assessed Right Lower Extremity Assessment RLE ROM/Strength/Tone: WFL for tasks assessed RLE Sensation: WFL - Light Touch RLE Coordination: WFL - gross motor Left Lower Extremity Assessment LLE ROM/Strength/Tone: WFL for tasks assessed LLE Sensation: WFL - Light  Touch LLE Coordination: WFL - gross motor Trunk Assessment Trunk Assessment: Normal     Mobility Bed Mobility Bed Mobility: Rolling Right;Rolling Left;Right Sidelying to Sit;Sitting - Scoot to Edge of Bed;Sit to Supine Rolling Right: With rail;4: Min assist Rolling Left: 4: Min assist;With rail Right Sidelying to Sit: 1: +2 Total assist;HOB elevated;With rails (30 degrees) Right Sidelying to Sit: Patient Percentage: 60% Sitting - Scoot to Edge of Bed: 5: Supervision Sit to Supine: Not Tested (comment) Details for Bed Mobility Assistance: pt. attempted to roll to left for brace application in bed with HOB at 30 as ordered.  He was unable to tolerate rolling far enough to get lumbar corset up under him, so brace applied in sitting.  Needed verbal cues for maintaining back precautions and for technique Transfers Transfers: Sit to Stand;Stand to Sit Sit to Stand: 1: +2 Total assist;With upper extremity assist;From bed Sit to Stand: Patient Percentage: 60% Stand to Sit: 3: Mod assist;To chair/3-in-1;With armrests;With upper extremity assist Details for Transfer Assistance: cues for back precautions and technique, assist to rise      Exercise     Balance Balance Balance Assessed: Yes Static Sitting Balance Static Sitting - Balance Support: Feet supported;Bilateral upper extremity supported Static Sitting - Level of Assistance: 5: Stand by assistance Static Standing Balance Static Standing - Balance Support: Bilateral upper extremity supported Static Standing - Level of Assistance: 4: Min assist   End of Session OT - End of Session Equipment Utilized During Treatment: Gait belt;Back brace Activity Tolerance: Patient tolerated treatment well Patient left: in chair;with call bell/phone within reach Nurse Communication: Mobility status  GO    08/14/2012 Cipriano Mile OTR/L Pager [phone] Office [phone]   Cipriano Mile 08/14/2012, 12:45 PM

## 2012-08-14 NOTE — Evaluation (Signed)
Physical Therapy Evaluation Patient Details Name: Sean [name] MRN: [id number] DOB: [birth year] Today's Date: 08/14/2012 Time: 4098-1191 PT Time Calculation (min): 37 min  PT Assessment / Plan / Recommendation Clinical Impression  Pt. is s/p re-do of lam/decompression at levels L3 thru S1.  He presents to PT with decreased mobility, gait ,transfers and willl need aucte PT to address these issues.  I am somewhat concerned that his son will be in and out once pt. DC's home (and not available 24/7).  At this point, will set goals for home and monitor his progress.      PT Assessment  Patient needs continued PT services    Follow Up Recommendations  Home health PT;Supervision/Assistance - 24 hour;Other (comment) (24 hour initially would be best)    Does the patient have the potential to tolerate intense rehabilitation      Barriers to Discharge Decreased caregiver support      Equipment Recommendations  Rolling walker with 5" wheels    Recommendations for Other Services     Frequency Min 5X/week    Precautions / Restrictions Precautions Precautions: Back;Fall Precaution Booklet Issued: Yes (comment) Precaution Comments: instructed pt. in back precautions and log rolling and provided handout Required Braces or Orthoses: Spinal Brace Spinal Brace: Lumbar corset;Other (comment);Applied in supine position;Applied in sitting position (ordered for in bed, pt. unable so applied in sitting) Spinal Brace Comments: pt. unable to roll adequately for applying brace in bed as ordered. Restrictions Weight Bearing Restrictions: No   Pertinent Vitals/Pain O2 sats on room air at 89% with activity.  O2 reapplied and RN Riley Lam made aware.      Mobility  Bed Mobility Bed Mobility: Rolling Right;Rolling Left;Right Sidelying to Sit;Sitting - Scoot to Edge of Bed;Sit to Supine Rolling Right: With rail;4: Min assist Rolling Left: 4: Min assist;With rail Right Sidelying to Sit: 1: +2 Total  assist;HOB elevated;With rails (30 degrees) Right Sidelying to Sit: Patient Percentage: 60% Sitting - Scoot to Edge of Bed: 5: Supervision Sit to Supine: Not Tested (comment) Details for Bed Mobility Assistance: pt. attempted to roll to left for brace application in bed with HOB at 30 as ordered.  He was unable to tolerate rolling far enough to get lumbar corset up under him, so brace applied in sitting.  Needed verbal cues for maintaining back precautions and for technique Transfers Transfers: Sit to Stand;Stand to Sit Sit to Stand: 1: +2 Total assist;With upper extremity assist;From bed Sit to Stand: Patient Percentage: 60% Details for Transfer Assistance: cues for back precautions and technique, assist to rise  Ambulation/Gait Ambulation/Gait Assistance: 4: Min assist;Other (comment) (second person pushing IV pole) Ambulation Distance (Feet): 12 Feet Assistive device: Rolling walker Ambulation/Gait Assistance Details: cues for erect posture and not lifting RW off the floor.  Min assist for safety and stability. Gait Pattern: Step-through pattern;Decreased step length - right;Decreased step length - left Gait velocity: slow Stairs: No    Exercises     PT Diagnosis: Difficulty walking;Acute pain  PT Problem List: Decreased activity tolerance;Decreased balance;Decreased mobility;Decreased knowledge of use of DME;Decreased knowledge of precautions;Cardiopulmonary status limiting activity;Obesity;Pain PT Treatment Interventions: DME instruction;Gait training;Stair training;Functional mobility training;Therapeutic activities;Balance training;Patient/family education   PT Goals Acute Rehab PT Goals PT Goal Formulation: With patient Time For Goal Achievement: 08/21/12 Potential to Achieve Goals: Good Pt will go Supine/Side to Sit: with modified independence;with HOB 0 degrees PT Goal: Supine/Side to Sit - Progress: Goal set today Pt will go Sit to Supine/Side: with modified  independence;with  HOB 0 degrees PT Goal: Sit to Supine/Side - Progress: Goal set today Pt will go Sit to Stand: with modified independence PT Goal: Sit to Stand - Progress: Goal set today Pt will go Stand to Sit: with modified independence PT Goal: Stand to Sit - Progress: Goal set today Pt will Ambulate: 51 - 150 feet;with modified independence;with rolling walker PT Goal: Ambulate - Progress: Goal set today Pt will Go Up / Down Stairs: 3-5 stairs;with min assist;with rail(s) PT Goal: Up/Down Stairs - Progress: Goal set today Additional Goals Additional Goal #1: pt. will state and adhere to back precautions and log rolling technique PT Goal: Additional Goal #1 - Progress: Goal set today  Visit Information  Last PT Received On: 08/14/12 Assistance Needed: +2 PT/OT Co-Evaluation/Treatment: Yes    Subjective Data  Subjective: Pt presents supine in bed, agreeable to initiate therapies Patient Stated Goal: return home, resume function with less pain   Prior Functioning  Home Living Lives With: Son Available Help at Discharge: Available PRN/intermittently;Family (son is Archivist and is in and out of the house) Type of Home: House Home Access: Stairs to enter Secretary/administrator of Steps: 4 Entrance Stairs-Rails: Right;Left;Can reach both Home Layout: One level Bathroom Shower/Tub: Tub/shower unit;Door [employer] Toilet: Standard Home Adaptive Equipment: Straight cane;Shower chair with back;Bedside commode/3-in-1;Walker - standard Prior Function Level of Independence: Independent with assistive device(s) (uses straight cane outdoors only) Able to Take Stairs?: Yes Driving: Yes Vocation: Retired Musician: No difficulties    Copywriter, advertising Overall Cognitive Status: Appears within functional limits for tasks assessed/performed Arousal/Alertness: Awake/alert Orientation Level: Appears intact for tasks assessed Behavior During Session: [facility] for  tasks performed    Extremity/Trunk Assessment Right Upper Extremity Assessment RUE ROM/Strength/Tone: [facility] for tasks assessed Left Upper Extremity Assessment LUE ROM/Strength/Tone: WFL for tasks assessed Right Lower Extremity Assessment RLE ROM/Strength/Tone: WFL for tasks assessed RLE Sensation: WFL - Light Touch RLE Coordination: WFL - gross motor Left Lower Extremity Assessment LLE ROM/Strength/Tone: WFL for tasks assessed LLE Sensation: WFL - Light Touch LLE Coordination: WFL - gross motor Trunk Assessment Trunk Assessment: Normal   Balance Balance Balance Assessed: Yes Static Sitting Balance Static Sitting - Balance Support: Feet supported;Bilateral upper extremity supported Static Sitting - Level of Assistance: 5: Stand by assistance Static Standing Balance Static Standing - Balance Support: Bilateral upper extremity supported Static Standing - Level of Assistance: 4: Min assist  End of Session PT - End of Session Equipment Utilized During Treatment: Gait belt;Back brace Activity Tolerance: Patient limited by fatigue;Patient limited by pain Patient left: in chair;with call bell/phone within reach Nurse Communication: Mobility status;Other (comment) (O2 sats)  GP     Ferman Hamming 08/14/2012, 10:14 AM Weldon Picking PT Acute Rehab Services [phone] Beeper [phone]

## 2012-08-14 NOTE — Clinical Social Work Note (Signed)
Clinical Social Work   CSW received consult for SNF. CSW reviewed pt's chart and discussed pt with RN during progression. Awaiting PT/OT evals, which are needed for insurance prior auth. CSW will assess for SNF, if appropriate. CSW will continue to follow.   Dede Query, MSW, LCSW [phone]

## 2012-08-14 NOTE — Clinical Social Work Note (Signed)
Clinical Social Work   CSW reviewed chart and PT/OT are recommending HHPT/OT. CSW will updated RNCM. CSW is signing off as no further needs identified. Please reconsult if a need arises prior to discharge.   Dede Query, MSW, LCSW [phone]

## 2012-08-14 NOTE — Progress Notes (Signed)
Inpatient Diabetes Program Recommendations  AACE/ADA: New Consensus Statement on Inpatient Glycemic Control (2013)  Target Ranges:  Prepandial:   less than 140 mg/dL      Peak postprandial:   less than 180 mg/dL (1-2 hours)      Critically ill patients:  140 - 180 mg/dL   Results for Sean [name], Sean [name] (MRN [id number]) as of 08/14/2012 10:58  Ref. Range 08/13/2012 06:19 08/13/2012 14:01 08/13/2012 17:09 08/13/2012 20:30 08/14/2012 00:00 08/14/2012 04:05 08/14/2012 07:59  Glucose-Capillary Latest Range: 70-99 mg/dL 409 (H) 811 (H) 914 (H) 148 (H) 121 (H) 106 (H) 155 (H)    Inpatient Diabetes Program Recommendations Correction (SSI): Please consdier changing the frequency of CBGs and Novolog correction to ACHS since patient is eating and tolerating diet.  Note: Patient has a history of diabetes and takes Glipizide 10mg  daily, Metformin 1000mg  with breakfast, and Metformin 500 mg with supper at home for diabetes management.  Currently, patient is ordered to receive Glipizide 10 mg every morning, Metformin 1000mg  with breakfast, and Metformin 500mg  with supper for inpatient glycemic control.  Since the patient is eating and tolerating his diet, please consider changing the frequency of CBGs and Novolog correction to ACHS.  Will continue to follow.  Thanks, Orlando Penner, RN, BSN, CCRN Diabetes Coordinator Inpatient Diabetes Program [phone]

## 2012-08-14 NOTE — Progress Notes (Signed)
Subjective: Patient reports "I'm doing ok. Sore in my back but nothing in my legs"  Objective: Vital signs in last 24 hours: Temp:  [97.7 F (36.5 C)-98.9 F (37.2 C)] 98.3 F (36.8 C) (03/28 0600) Pulse Rate:  [73-89] 80 (03/28 0600) Resp:  [16-26] 20 (03/28 0600) BP: (105-138)/(57-82) 105/66 mmHg (03/28 0600) SpO2:  [91 %-97 %] 93 % (03/28 0600) FiO2 (%):  [4 %] 4 % (03/27 1635)  Intake/Output from previous day: 03/27 0701 - 03/28 0700 In: 2875 [P.O.:120; I.V.:2600; Blood:155] Out: 2260 [Urine:1310; Drains:500; Blood:450] Intake/Output this shift:    Alert, conversant, smiling. Reports lumbar pain, controlled with PCA. Denies buttock & leg pain. Good strength BLE. Drsg intact, clean, & dry. Hemovac patent: overnight.  Lab Results: No results found for this basename: WBC, HGB, HCT, PLT,  in the last 72 hours BMET No results found for this basename: NA, K, CL, CO2, GLUCOSE, BUN, CREATININE, CALCIUM,  in the last 72 hours  Studies/Results: Dg Lumbar Spine 2-3 Views  08/13/2012  *RADIOLOGY REPORT*  Clinical Data: Lumbar fusion  DG C-ARM 1-60 MIN,LUMBAR SPINE - 2-3 VIEW  Comparison: MR 06/23/2012  Findings: Two intraoperative fluoroscopic spot images document bilateral pedicle screw placement L3, L4, L5, and S1.  Graft markers project in the intervening interspaces.  IMPRESSION:  PLIF L3 S1   Original Report Authenticated By: D. Andria Rhein, MD    Dg Lumbar Spine 1 View  08/13/2012  *RADIOLOGY REPORT*  Clinical Data: L3-S1 PLIF  LUMBAR SPINE - 1 VIEW  Comparison: MRI lumbar spine dated 06/23/2012  Findings: Surgical probes posterior to the L3, L4, L5, and S1 vertebral bodies.  IMPRESSION: Intraoperative localization as above.   Original Report Authenticated By: Charline Bills, M.D.    Dg C-arm 1-60 Min  08/13/2012  *RADIOLOGY REPORT*  Clinical Data: Lumbar fusion  DG C-ARM 1-60 MIN,LUMBAR SPINE - 2-3 VIEW  Comparison: MR 06/23/2012  Findings: Two intraoperative fluoroscopic  spot images document bilateral pedicle screw placement L3, L4, L5, and S1.  Graft markers project in the intervening interspaces.  IMPRESSION:  PLIF L3 S1   Original Report Authenticated By: D. Andria Rhein, MD     Assessment/Plan: Improving   LOS: 1 day  Mobilize in LSO with PT.   Georgiann Cocker 08/14/2012, 8:20 AM

## 2012-08-14 NOTE — Care Management Note (Signed)
    Page 1 of 2   08/17/2012     12:45:16 PM   CARE MANAGEMENT NOTE 08/17/2012  Patient:  Sean [name], Sean [name]   Account Number:  [id number]  Date Initiated:  08/14/2012  Documentation initiated by:  [facility]  Subjective/Objective Assessment:   admitted postop L3-4, L4-5, L5-S1 PLIF     Action/Plan:   PT/OT evals- recommending HHPT, HHOT, rolling walker   Anticipated DC Date:  08/17/2012   Anticipated DC Plan:  HOME W HOME HEALTH SERVICES      DC Planning Services  CM consult      Choice offered to / List presented to:  C-1 Patient        HH arranged  HH-2 PT  HH-3 OT      [facility] agency  [facility]   Status of service:  Completed, signed off Medicare Important Message given?   (If response is "NO", the following Medicare IM given date fields will be blank) Date Medicare IM given:   Date Additional Medicare IM given:    Discharge Disposition:  HOME W HOME HEALTH SERVICES  Per UR Regulation:  Reviewed for med. necessity/level of care/duration of stay  If discussed at Long Length of Stay Meetings, dates discussed:    Comments:  08/17/12 Spoke with patient about HHC, he chose [facility].Patient had his walker from home in his room, it is a standard walker. Patient agreeable to putting wheels on it. Contacted Mick Tanguma at [facility] and set up HHPT and HHOT. Contacted Darian at [facility] and requested wheels for his walker to be applied prior to discharge today. Jacquelynn Cree RN, BSN, CCM  08/14/12 PT and OT recommending HHPT, HHOT and rolling walker. Spoke with patient about HHC and gave him [facility] list of agencies. He stated that he lives alone but that his son will be staying with him upon discharge. Patient already has a rolling walker at home. CM will follow up to set up HHC. Jacquelynn Cree RN, BSN, CCM

## 2012-08-15 LAB — GLUCOSE, CAPILLARY
Glucose-Capillary: 117 mg/dL — ABNORMAL HIGH (ref 70–99)
Glucose-Capillary: 112 mg/dL — ABNORMAL HIGH (ref 70–99)
Glucose-Capillary: 108 mg/dL — ABNORMAL HIGH (ref 70–99)
Glucose-Capillary: 121 mg/dL — ABNORMAL HIGH (ref 70–99)

## 2012-08-15 NOTE — Progress Notes (Signed)
Physical Therapy Treatment Patient Details Name: Sean [name] MRN: [id number] DOB: 1950/11/16 Today's Date: 08/15/2012 Time: 4742-5956 PT Time Calculation (min): 30 min  PT Assessment / Plan / Recommendation Comments on Treatment Session  Pt cont's to require +2 for bed mobiltiy & sit>stand but was able to increase ambulation distance today.  Still unsure if pt appropriate to d/c home without 24/7 (A)/(S).  Will cont' to follow acutely.      Follow Up Recommendations  Home health PT;Supervision/Assistance - 24 hour;Other (comment) (24 hr initially would be best)     Does the patient have the potential to tolerate intense rehabilitation     Barriers to Discharge        Equipment Recommendations  Rolling walker with 5" wheels    Recommendations for Other Services    Frequency Min 5X/week   Plan Discharge plan remains appropriate    Precautions / Restrictions Precautions Precautions: Back;Fall Precaution Comments: Pt unable to recall any of the 3 back precautions.  Re-educated on all 3 precautions & logrolling technique  Required Braces or Orthoses: Spinal Brace Spinal Brace: Lumbar corset;Other (comment);Applied in supine position;Applied in sitting position Spinal Brace Comments: Able to donn back brace with HOB 30 degrees today but required +2 (A).   Restrictions Weight Bearing Restrictions: No   Pertinent Vitals/Pain C/o back pain with activity.    Pt on PCA & RN administered muscle relaxer upon arrival.      Mobility  Bed Mobility Bed Mobility: Rolling Right;Rolling Left;Left Sidelying to Sit;Sitting - Scoot to Edge of Bed Rolling Right: 3: Mod assist;With rail Rolling Left: 4: Min assist;With rail Left Sidelying to Sit: 1: +2 Total assist;HOB elevated;With rails (HOB elevated 30 degrees) Left Sidelying to Sit: Patient Percentage: 50% Sitting - Scoot to Edge of Bed: 5: Supervision Details for Bed Mobility Assistance: Cues for sequencing & technique.   transitional  movements painful & effortful.   Transfers Transfers: Sit to Stand;Stand to Sit Sit to Stand: 1: +2 Total assist;With upper extremity assist;From bed;Other (comment) (bed height elevated) Sit to Stand: Patient Percentage: 60% Stand to Sit: 3: Mod assist;With upper extremity assist;With armrests;To chair/3-in-1 Details for Transfer Assistance: Cues for hand placement & technique.  (A) to achieve standing, & control descent.  Ambulation/Gait Ambulation/Gait Assistance: 4: Min assist;3: Mod assist Ambulation Distance (Feet): 75 Feet Assistive device: Rolling walker Ambulation/Gait Assistance Details: Increased (A) required as distance increased due to pt with Lt lateral lean.  Cues for safe use of RW, body positioning inside RW.   2nd person following with recliner.  Pt returned back to room via recliner.   Gait Pattern: Step-through pattern;Decreased stride length;Lateral trunk lean to left;Decreased stance time - left Gait velocity: slow Stairs: No Wheelchair Mobility Wheelchair Mobility: No     PT Goals Acute Rehab PT Goals Time For Goal Achievement: 08/21/12 Potential to Achieve Goals: Good Pt will go Supine/Side to Sit: with modified independence;with HOB 0 degrees PT Goal: Supine/Side to Sit - Progress: Not met Pt will go Sit to Supine/Side: with modified independence;with HOB 0 degrees Pt will go Sit to Stand: with modified independence PT Goal: Sit to Stand - Progress: Not met Pt will go Stand to Sit: with modified independence PT Goal: Stand to Sit - Progress: Not met Pt will Ambulate: 51 - 150 feet;with modified independence;with rolling walker PT Goal: Ambulate - Progress: Progressing toward goal Pt will Go Up / Down Stairs: 3-5 stairs;with min assist;with rail(s) Additional Goals Additional Goal #1: pt. will state  and adhere to back precautions and log rolling technique PT Goal: Additional Goal #1 - Progress: Not met  Visit Information  Last PT Received On:  08/15/12 Assistance Needed: +2    Subjective Data      Cognition  Cognition Overall Cognitive Status: Appears within functional limits for tasks assessed/performed Arousal/Alertness: Awake/alert Orientation Level: Appears intact for tasks assessed Behavior During Session: [facility] for tasks performed    Balance     End of Session PT - End of Session Equipment Utilized During Treatment: Gait belt;Back brace Activity Tolerance: Patient tolerated treatment well Patient left: in chair;with call bell/phone within reach Nurse Communication: Mobility status     Sean [name], [place] [phone] 08/15/2012

## 2012-08-15 NOTE — Progress Notes (Signed)
Doing well. C/o appropriate incisional soreness. Starting to ambulate more  Temp:  [97.4 F (36.3 C)-98.2 F (36.8 C)] 98.2 F (36.8 C) (03/29 0600) Pulse Rate:  [72-82] 75 (03/29 0600) Resp:  [18-22] 20 (03/29 0600) BP: (100-133)/(52-80) 133/80 mmHg (03/29 0600) SpO2:  [92 %-99 %] 99 % (03/29 0600) Weight:  [143.4 kg (316 lb 2.2 oz)] 143.4 kg (316 lb 2.2 oz) (03/29 0200) Good strength and sensation Incision CDI  Plan: Increase activity

## 2012-08-16 LAB — GLUCOSE, CAPILLARY
Glucose-Capillary: 141 mg/dL — ABNORMAL HIGH (ref 70–99)
Glucose-Capillary: 91 mg/dL (ref 70–99)

## 2012-08-16 NOTE — Progress Notes (Signed)
Patient ID: Sean [name], male   DOB: 05/07/1951, 62 y.o.   MRN: [id number] Patient is doing well. He denies leg pain. Still has back pain. Seems to have good strength. Ambulated to the bathroom without assistance. We'll DC Hemovac and PCA. Hopefully home tomorrow

## 2012-08-16 NOTE — Progress Notes (Signed)
Physical Therapy Treatment Patient Details Name: Sean [name] MRN: [id number] DOB: 1950/11/04 Today's Date: 08/16/2012 Time: 4098-1191 PT Time Calculation (min): 25 min  PT Assessment / Plan / Recommendation Comments on Treatment Session  Pt making good progress with mobilty at this date but still not at mod I level.  When asked how many hrs/day he would be by himself, he laughs & then states "I wont really know until I'm there".   Pt needs to practice steps before d/cing home.      Follow Up Recommendations  Home health PT;Supervision/Assistance - 24 hour;Other (comment)     Does the patient have the potential to tolerate intense rehabilitation     Barriers to Discharge        Equipment Recommendations  Rolling walker with 5" wheels    Recommendations for Other Services    Frequency Min 5X/week   Plan Discharge plan remains appropriate    Precautions / Restrictions Precautions Precautions: Back;Fall Required Braces or Orthoses: Spinal Brace Spinal Brace: Lumbar corset;Other (comment);Applied in supine position;Applied in sitting position Restrictions Weight Bearing Restrictions: No       Mobility  Bed Mobility Bed Mobility: Rolling Left;Left Sidelying to Sit;Sitting - Scoot to [place] of Bed;Sit to Sidelying Left Rolling Left: 5: Supervision Left Sidelying to Sit: 5: Supervision;HOB flat Sitting - Scoot to Edge of Bed: 5: Supervision Sit to Sidelying Left: 4: Min assist;HOB flat Details for Bed Mobility Assistance: Cues to reinforce back precautions & log rolling technique.  (A) to lift LE's into bed.   Transfers Transfers: Sit to Stand;Stand to Sit Sit to Stand: 4: Min guard;With armrests;From bed;From chair/3-in-1;With upper extremity assist Stand to Sit: 4: Min guard;With upper extremity assist;With armrests;To bed;To chair/3-in-1 Ambulation/Gait Ambulation/Gait Assistance: 4: Min guard Ambulation Distance (Feet): 120 Feet Assistive device: Rolling  walker Ambulation/Gait Assistance Details: Cues for body positioning inside RW.  Pt with mild Rt side limp but reports this is due to old issues with LE.   Gait Pattern: Step-through pattern;Decreased stride length (Rt limp) Stairs: No Wheelchair Mobility Wheelchair Mobility: No     PT Goals Acute Rehab PT Goals Time For Goal Achievement: 08/21/12 Potential to Achieve Goals: Good Pt will go Supine/Side to Sit: with modified independence;with HOB 0 degrees PT Goal: Supine/Side to Sit - Progress: Progressing toward goal Pt will go Sit to Supine/Side: with modified independence;with HOB 0 degrees PT Goal: Sit to Supine/Side - Progress: Progressing toward goal Pt will go Sit to Stand: with modified independence PT Goal: Sit to Stand - Progress: Progressing toward goal Pt will go Stand to Sit: with modified independence PT Goal: Stand to Sit - Progress: Progressing toward goal Pt will Ambulate: 51 - 150 feet;with modified independence;with rolling walker PT Goal: Ambulate - Progress: Progressing toward goal Pt will Go Up / Down Stairs: 3-5 stairs;with min assist;with rail(s) Additional Goals Additional Goal #1: pt. will state and adhere to back precautions and log rolling technique PT Goal: Additional Goal #1 - Progress: Progressing toward goal  Visit Information  Last PT Received On: 08/16/12 Assistance Needed: +1    Subjective Data      Cognition  Cognition Overall Cognitive Status: Appears within functional limits for tasks assessed/performed Arousal/Alertness: Awake/alert Orientation Level: Appears intact for tasks assessed Behavior During Session: [facility] for tasks performed    Balance     End of Session PT - End of Session Equipment Utilized During Treatment: Gait belt;Back brace Activity Tolerance: Patient tolerated treatment well Patient left: in chair;with call  bell/phone within reach Nurse Communication: Mobility status     Verdell Face,  [place] [phone] 08/16/2012

## 2012-08-17 DIAGNOSIS — M4316 Spondylolisthesis, lumbar region: Secondary | ICD-10-CM

## 2012-08-17 LAB — GLUCOSE, CAPILLARY: Glucose-Capillary: 128 mg/dL — ABNORMAL HIGH (ref 70–99)

## 2012-08-17 MED ORDER — HYDROCODONE-ACETAMINOPHEN 10-325 MG PO TABS: 1.0000 | ORAL_TABLET | ORAL | Status: DC | PRN

## 2012-08-17 MED ORDER — METHOCARBAMOL 500 MG PO TABS: 500.0000 mg | ORAL_TABLET | Freq: Three times a day (TID) | ORAL | Status: DC | PRN

## 2012-08-17 MED FILL — Sodium Chloride IV Soln 0.9%: INTRAVENOUS | Qty: 3000 | Status: AC

## 2012-08-17 MED FILL — Heparin Sodium (Porcine) Inj 1000 Unit/ML: INTRAMUSCULAR | Qty: 30 | Status: AC

## 2012-08-17 NOTE — Discharge Summary (Signed)
. Physician Discharge Summary  Patient ID: Sean [name] MRN: [id number] DOB/AGE: Feb 26, 1951 62 y.o.  Admit date: 08/13/2012 Discharge date: 08/17/2012  Admission Diagnoses:Spondylolisthesis lumbar spine, spinal stenosis, neurogenic claudication, morbid obesity, NIDDM, tobacco use disorder  Discharge Diagnoses: Spondylolisthesis lumbar spine, spinal stenosis, neurogenic claudication, morbid obesity, NIDDM, tobacco use disorder  Active Problems:   Spondylolisthesis of lumbar region   Discharged Condition: good  Hospital Course: Underwent redo decompression and fusion L3 - S 1 levels  Consults: None  Significant Diagnostic Studies: None  Treatments: surgery: redo decompression and fusion L3 - S 1 levels  Discharge Exam: Blood pressure 111/62, pulse 75, temperature 98.2 F (36.8 C), temperature source Oral, resp. rate 16, height 6\' 1"  (1.854 m), weight 143.4 kg (316 lb 2.2 oz), SpO2 96.00%. Neurologic: Alert and oriented X 3, normal strength and tone. Normal symmetric reflexes. Normal coordination and gait Wound:CDI  Disposition: Home  Discharge Orders   Future Appointments Provider Department Dept Phone   10/06/2012 1:30 PM Kathlen Brunswick, MD [facility] [place] [phone]   Future Orders Complete By Expires     Change dressing  As directed         Medication List    STOP taking these medications       meloxicam 15 MG tablet  Commonly known as:  MOBIC     VIAGRA 100 MG tablet  Generic drug:  sildenafil      TAKE these medications       ACCU-CHEK AVIVA PLUS test strip  Generic drug:  glucose blood     amLODipine-benazepril 10-40 MG per capsule  Commonly known as:  LOTREL  Take 1 capsule by mouth daily.     aspirin 81 MG tablet  Take 81 mg by mouth daily.     chlorthalidone 25 MG tablet  Commonly known as:  HYGROTON  Take 25 mg by mouth daily.     doxazosin 4 MG tablet  Commonly known as:  CARDURA  Take 4 mg by mouth daily.     ezetimibe-simvastatin 10-40 MG per tablet  Commonly known as:  VYTORIN  Take 1 tablet by mouth at bedtime.     glipiZIDE 10 MG tablet  Commonly known as:  GLUCOTROL  Take 10 mg by mouth daily.     HYDROcodone-acetaminophen 7.5-750 MG per tablet  Commonly known as:  VICODIN ES  Take 1 tablet by mouth every 6 (six) hours as needed for pain.     HYDROcodone-acetaminophen 10-325 MG per tablet  Commonly known as:  NORCO  Take 1 tablet by mouth every 4 (four) hours as needed for pain.     metFORMIN 500 MG tablet  Commonly known as:  GLUCOPHAGE  Take 500-1,000 mg by mouth 2 (two) times daily. Takes 2 tab in am and 1 tab in pm.     methocarbamol 500 MG tablet  Commonly known as:  ROBAXIN  Take 500 mg by mouth every 8 (eight) hours as needed. Muscle spasm.     methocarbamol 500 MG tablet  Commonly known as:  ROBAXIN  Take 1 tablet (500 mg total) by mouth every 8 (eight) hours as needed (muscle spasms).     metoprolol succinate 100 MG 24 hr tablet  Commonly known as:  TOPROL-XL  Take 100 mg by mouth daily. Take with or immediately following a meal.     NEXIUM 40 MG capsule  Generic drug:  esomeprazole  Take 40 mg by mouth daily.     niacin 500 MG  CR tablet  Commonly known as:  NIASPAN  Take 3 tablets (1,500 mg total) by mouth at bedtime.     TRILIPIX 135 MG capsule  Generic drug:  Choline Fenofibrate  Take 135 mg by mouth daily.     Vitamin D 1000 UNITS capsule  Take 1,000 Units by mouth daily.         Signed: Dorian Heckle, MD 08/17/2012, 8:26 AM

## 2012-08-17 NOTE — Progress Notes (Signed)
Physical Therapy Treatment Patient Details Name: Sean [name] MRN: [id number] DOB: 16-Sep-1950 Today's Date: 08/17/2012 Time: 4098-1191 PT Time Calculation (min): 23 min  PT Assessment / Plan / Recommendation Comments on Treatment Session  Pt cont's to progress with mobility but still recommend pt have 24 hr (A)/(S) initially at d/c.      Follow Up Recommendations  Home health PT;Supervision/Assistance - 24 hour;Other (comment)     Does the patient have the potential to tolerate intense rehabilitation     Barriers to Discharge        Equipment Recommendations  Rolling walker with 5" wheels    Recommendations for Other Services    Frequency Min 5X/week   Plan Discharge plan remains appropriate    Precautions / Restrictions Precautions Precautions: Back;Fall Required Braces or Orthoses: Spinal Brace Spinal Brace: Lumbar corset;Other (comment);Applied in supine position;Applied in sitting position Spinal Brace Comments: pt reports he has been sleeping with back brace on.   Restrictions Weight Bearing Restrictions: No       Mobility  Bed Mobility Bed Mobility: Supine to Sit;Sitting - Scoot to Edge of Bed Details for Bed Mobility Assistance: Pt keeping HOB ~40 degrees.  Instructed on log rolling but pt continuing to sit upright via longsitting; states "when I get home to king sized bed I will be able to move better".    Transfers Transfers: Sit to Stand;Stand to Sit Sit to Stand: 4: Min guard;With upper extremity assist;From bed Stand to Sit: 5: Supervision;With upper extremity assist;With armrests;To chair/3-in-1 Ambulation/Gait Ambulation/Gait Assistance: 5: Supervision Ambulation Distance (Feet): 200 Feet Assistive device: Rolling walker Ambulation/Gait Assistance Details: cues to stay closer to RW.   Gait Pattern: Step-through pattern Stairs: Yes Stairs Assistance: 5: Supervision Stairs Assistance Details (indicate cue type and reason): (S) for safety.   Stair  Management Technique: Two rails;Alternating pattern;Forwards Number of Stairs: 5 Wheelchair Mobility Wheelchair Mobility: No     PT Goals Acute Rehab PT Goals Time For Goal Achievement: 08/21/12 Potential to Achieve Goals: Good Pt will go Supine/Side to Sit: with modified independence;with HOB 0 degrees PT Goal: Supine/Side to Sit - Progress: Not met Pt will go Sit to Supine/Side: with modified independence;with HOB 0 degrees Pt will go Sit to Stand: with modified independence PT Goal: Sit to Stand - Progress: Progressing toward goal Pt will go Stand to Sit: with modified independence PT Goal: Stand to Sit - Progress: Progressing toward goal Pt will Ambulate: 51 - 150 feet;with modified independence;with rolling walker PT Goal: Ambulate - Progress: Progressing toward goal Pt will Go Up / Down Stairs: 3-5 stairs;with min assist;with rail(s) PT Goal: Up/Down Stairs - Progress: Met Additional Goals Additional Goal #1: pt. will state and adhere to back precautions and log rolling technique PT Goal: Additional Goal #1 - Progress: Progressing toward goal  Visit Information  Last PT Received On: 08/17/12 Assistance Needed: +1    Subjective Data      Cognition  Cognition Overall Cognitive Status: Appears within functional limits for tasks assessed/performed Arousal/Alertness: Awake/alert Orientation Level: Appears intact for tasks assessed Behavior During Session: [facility] for tasks performed    Balance     End of Session PT - End of Session Equipment Utilized During Treatment: Gait belt;Back brace Activity Tolerance: Patient tolerated treatment well Patient left: in chair;with call bell/phone within reach Nurse Communication: Mobility status     Verdell Face, [place] [phone] 08/17/2012

## 2012-08-17 NOTE — Progress Notes (Signed)
Occupational Therapy Treatment Patient Details Name: Sean [name] MRN: [id number] DOB: Mar 23, 1951 Today's Date: 08/17/2012 Time: 1009-     OT Assessment / Plan / Recommendation Comments on Treatment Session Making excellent progress. REady to go home today. Will continue to benefit from [facility].     Follow Up Recommendations  Home health OT;Supervision - Intermittent    Barriers to Discharge   none    Equipment Recommendations  None recommended by OT    Recommendations for Other Services    Frequency     Plan Discharge plan remains appropriate    Precautions / Restrictions Precautions Precautions: Back;Fall Required Braces or Orthoses: Spinal Brace Spinal Brace: Lumbar corset;Other (comment);Applied in supine position;Applied in sitting position Spinal Brace Comments: pt reports he has been sleeping with back brace on.   Restrictions Weight Bearing Restrictions: No   Pertinent Vitals/Pain no apparent distress     ADL  Grooming: Modified independent Where Assessed - Grooming: Unsupported standing Upper Body Bathing: Set up Where Assessed - Upper Body Bathing: Unsupported sitting Upper Body Dressing: Set up;Supervision/safety Lower Body Dressing: Set up;Supervision/safety Where Assessed - Lower Body Dressing: Unsupported sit to stand Toilet Transfer: Modified independent Toilet Transfer Method:  (ambulating) Toilet Transfer Equipment: Comfort height toilet Toileting - Clothing Manipulation and Hygiene: Supervision/safety Where Assessed - Engineer, mining and Hygiene: Sit to stand from 3-in-1 or toilet Equipment Used: Back brace;Rolling walker Transfers/Ambulation Related to ADLs: Mod I ADL Comments: discussed use of AE for LB ADL. Pt states he has AE at home. Also demonstrated compensatory techniques. Pt able to return demonstrate with min vc during LB ADL and toileting.    OT Diagnosis:    OT Problem List:   OT Treatment Interventions:     OT  Goals Acute Rehab OT Goals OT Goal Formulation: With patient Time For Goal Achievement: 08/21/12 Potential to Achieve Goals: Good ADL Goals Pt Will Perform Lower Body Bathing: with modified independence;Sit to stand from chair;Sit to stand from bed;with adaptive equipment ADL Goal: Lower Body Bathing - Progress: Progressing toward goals Pt Will Perform Lower Body Dressing: with modified independence;Sit to stand from chair;Sit to stand from bed;with adaptive equipment ADL Goal: Lower Body Dressing - Progress: Progressing toward goals Pt Will Transfer to Toilet: with modified independence;Ambulation;with DME;Comfort height toilet;Maintaining back safety precautions ADL Goal: Toilet Transfer - Progress: Progressing toward goals Pt Will Perform Toileting - Clothing Manipulation: with modified independence;Standing;Sitting on 3-in-1 or toilet ADL Goal: Toileting - Clothing Manipulation - Progress: Progressing toward goals Miscellaneous OT Goals Miscellaneous OT Goal #1: Pt will perform bed mobility with mod I as precursor for EOB ADLs. OT Goal: Miscellaneous Goal #1 - Progress: Progressing toward goals Miscellaneous OT Goal #2: Pt will independently verbalize and generalize 3/3 back precautions during all ADLs. OT Goal: Miscellaneous Goal #2 - Progress: Met Miscellaneous OT Goal #3: Pt will don/doff back brace at mod I level as precursor for functional mobility. OT Goal: Miscellaneous Goal #3 - Progress: Met  Visit Information  Last OT Received On: 08/17/12 Assistance Needed: +1    Subjective Data      Prior Functioning       Cognition  Cognition Overall Cognitive Status: Appears within functional limits for tasks assessed/performed Arousal/Alertness: Awake/alert Orientation Level: Appears intact for tasks assessed Behavior During Session: [facility] for tasks performed    Mobility  Bed Mobility Bed Mobility: Supine to Sit;Sitting - Scoot to Edge of Bed Details for Bed Mobility  Assistance: Pt keeping HOB ~40 degrees.  Instructed  on log rolling but pt continuing to sit upright via longsitting; states "when I get home to king sized bed I will be able to move better".    Transfers Transfers: Sit to Stand;Stand to Sit Sit to Stand: 6: Modified independent (Device/Increase time) Stand to Sit: 5: Supervision;With upper extremity assist;With armrests;To chair/3-in-1    Exercises      Balance     End of Session OT - End of Session Equipment Utilized During Treatment: Back brace Activity Tolerance: Patient tolerated treatment well Patient left: in chair;with call bell/phone within reach Nurse Communication: Mobility status;Other (comment) (ready for D/C)  GO     Diasia Henken,H[place] 08/17/2012, 10:33 AM Luisa Dago, OTR/L  [phone] 08/17/2012

## 2012-08-17 NOTE — Progress Notes (Signed)
Subjective: Patient reports doing well  Objective: Vital signs in last 24 hours: Temp:  [98.1 F (36.7 C)-99.6 F (37.6 C)] 98.2 F (36.8 C) (03/31 0629) Pulse Rate:  [75-86] 75 (03/31 0629) Resp:  [16-18] 16 (03/31 0629) BP: (107-144)/(60-83) 111/62 mmHg (03/31 0629) SpO2:  [93 %-96 %] 96 % (03/31 0629)  Intake/Output from previous day: 03/30 0701 - 03/31 0700 In: -  Out: 401 [Urine:400; Stool:1] Intake/Output this shift:    Physical Exam: Full strength, dressing CDI.  Lab Results: No results found for this basename: WBC, HGB, HCT, PLT,  in the last 72 hours BMET No results found for this basename: NA, K, CL, CO2, GLUCOSE, BUN, CREATININE, CALCIUM,  in the last 72 hours  Studies/Results: No results found.  Assessment/Plan: D/C home.  F/U 3 weeks in office.    LOS: 4 days    Dorian Heckle, MD 08/17/2012, 8:19 AM

## 2012-10-06 ENCOUNTER — Encounter: Payer: Self-pay | Admitting: Cardiology

## 2012-10-06 ENCOUNTER — Ambulatory Visit (INDEPENDENT_AMBULATORY_CARE_PROVIDER_SITE_OTHER): Payer: 59 | Admitting: Cardiology

## 2012-10-06 VITALS — BP 108/69 | HR 90 | Ht 73.0 in | Wt 279.0 lb

## 2012-10-06 DIAGNOSIS — E785 Hyperlipidemia, unspecified: Secondary | ICD-10-CM

## 2012-10-06 DIAGNOSIS — I10 Essential (primary) hypertension: Secondary | ICD-10-CM

## 2012-10-06 DIAGNOSIS — E876 Hypokalemia: Secondary | ICD-10-CM

## 2012-10-06 DIAGNOSIS — M4310 Spondylolisthesis, site unspecified: Secondary | ICD-10-CM

## 2012-10-06 DIAGNOSIS — F17200 Nicotine dependence, unspecified, uncomplicated: Secondary | ICD-10-CM

## 2012-10-06 DIAGNOSIS — M4316 Spondylolisthesis, lumbar region: Secondary | ICD-10-CM

## 2012-10-06 DIAGNOSIS — Z720 Tobacco use: Secondary | ICD-10-CM

## 2012-10-06 NOTE — Assessment & Plan Note (Signed)
Lipid profile less than one year ago was excellent. Current medication will be continued.

## 2012-10-06 NOTE — Progress Notes (Deleted)
Name: Sean [name]    DOB: 1950-08-19  Age: 62 y.o.  MR#: [id number]       PCP:  Alice Reichert, MD      Insurance: Payor: Cleatrice Burke / Plan: Advertising copywriter / Product Type: *No Product type* /   CC:   No chief complaint on file.  LIST VS Filed Vitals:   10/06/12 1344  BP: 108/69  Pulse: 90  Height: 6\' 1"  (1.854 m)  Weight: 279 lb (126.554 kg)    Weights Current Weight  10/06/12 279 lb (126.554 kg)  08/15/12 316 lb 2.2 oz (143.4 kg)  08/15/12 316 lb 2.2 oz (143.4 kg)    Blood Pressure  BP Readings from Last 3 Encounters:  10/06/12 108/69  08/17/12 125/70  08/17/12 125/70     Admit date:  (Not on file) Last encounter with RMR:  05/29/2012   Allergy Catapres  Current Outpatient Prescriptions  Medication Sig Dispense Refill  . ACCU-CHEK AVIVA PLUS test strip       . amLODipine-benazepril (LOTREL) 10-40 MG per capsule Take 1 capsule by mouth daily.      Marland Kitchen aspirin 81 MG tablet Take 81 mg by mouth daily.      . chlorthalidone (HYGROTON) 25 MG tablet Take 25 mg by mouth daily.      . Cholecalciferol (VITAMIN D) 1000 UNITS capsule Take 1,000 Units by mouth daily.        . Choline Fenofibrate (TRILIPIX) 135 MG capsule Take 135 mg by mouth daily.        Marland Kitchen doxazosin (CARDURA) 4 MG tablet Take 4 mg by mouth daily.      Marland Kitchen esomeprazole (NEXIUM) 40 MG capsule Take 40 mg by mouth daily.        Marland Kitchen ezetimibe-simvastatin (VYTORIN) 10-40 MG per tablet Take 1 tablet by mouth at bedtime.        Marland Kitchen glipiZIDE (GLUCOTROL) 10 MG tablet Take 10 mg by mouth daily.        . meloxicam (MOBIC) 15 MG tablet       . metFORMIN (GLUCOPHAGE) 500 MG tablet Take 500-1,000 mg by mouth 2 (two) times daily. Takes 2 tab in am and 1 tab in pm.      . methocarbamol (ROBAXIN) 500 MG tablet Take 1 tablet (500 mg total) by mouth every 8 (eight) hours as needed (muscle spasms).  90 tablet  0  . metoprolol succinate (TOPROL-XL) 100 MG 24 hr tablet Take 100 mg by mouth daily. Take with or immediately following  a meal.      . niacin (NIASPAN) 500 MG CR tablet Take 3 tablets (1,500 mg total) by mouth at bedtime.  90 tablet  1  . oxyCODONE-acetaminophen (PERCOCET) 10-325 MG per tablet       . sildenafil (VIAGRA) 50 MG tablet Take 50 mg by mouth daily as needed for erectile dysfunction.      . varenicline (CHANTIX) 1 MG tablet Take 1 mg by mouth 2 (two) times daily.      Marland Kitchen HYDROcodone-acetaminophen (NORCO) 10-325 MG per tablet Take 1 tablet by mouth every 4 (four) hours as needed for pain.  100 tablet  0  . HYDROcodone-acetaminophen (VICODIN ES) 7.5-750 MG per tablet Take 1 tablet by mouth every 6 (six) hours as needed for pain.        No current facility-administered medications for this visit.    Discontinued Meds:    Medications Discontinued During This Encounter  Medication Reason  . amLODipine-benazepril (LOTREL) 10-40  MG per capsule Error  . methocarbamol (ROBAXIN) 500 MG tablet Error    Patient Active Problem List   Diagnosis Date Noted  . Spondylolisthesis of lumbar region 08/17/2012  . Hypokalemia 12/17/2010  . DJD (degenerative joint disease)   . Diabetes mellitus type II 01/25/2010  . HYPERLIPIDEMIA 01/25/2010  . OBESITY 01/25/2010  . TOBACCO ABUSE 01/25/2010  . HYPERTENSION 05/29/2009    LABS    Component Value Date/Time   NA 143 08/06/2012 0923   NA 143 07/08/2012 1433   NA 144 12/27/2011 0935   K 3.7 08/06/2012 0923   K 3.8 07/08/2012 1433   K 4.1 12/27/2011 0935   CL 104 08/06/2012 0923   CL 103 07/08/2012 1433   CL 104 12/27/2011 0935   CO2 27 08/06/2012 0923   CO2 27 07/08/2012 1433   CO2 29 12/27/2011 0935   GLUCOSE 143* 08/06/2012 0923   GLUCOSE 106* 07/08/2012 1433   GLUCOSE 77 12/27/2011 0935   BUN 16 08/06/2012 0923   BUN 20 07/08/2012 1433   BUN 25* 12/27/2011 0935   CREATININE 1.07 08/06/2012 0923   CREATININE 1.25 07/08/2012 1433   CREATININE 1.32 12/27/2011 0935   CREATININE 1.38* 08/01/2011 0830   CREATININE 1.03 01/04/2011 0606   CREATININE 1.09 01/03/2011 0520   CALCIUM  9.3 08/06/2012 0923   CALCIUM 9.9 07/08/2012 1433   CALCIUM 9.8 12/27/2011 0935   GFRNONAA 74* 08/06/2012 0923   GFRNONAA >60 01/04/2011 0606   GFRNONAA >60 01/03/2011 0520   GFRAA 85* 08/06/2012 0923   GFRAA >60 01/04/2011 0606   GFRAA >60 01/03/2011 0520   CMP     Component Value Date/Time   NA 143 08/06/2012 0923   K 3.7 08/06/2012 0923   CL 104 08/06/2012 0923   CO2 27 08/06/2012 0923   GLUCOSE 143* 08/06/2012 0923   BUN 16 08/06/2012 0923   CREATININE 1.07 08/06/2012 0923   CREATININE 1.25 07/08/2012 1433   CALCIUM 9.3 08/06/2012 0923   PROT 7.1 12/24/2010 0924   ALBUMIN 4.2 12/24/2010 0924   AST 15 12/24/2010 0924   ALT 21 12/24/2010 0924   ALKPHOS 59 12/24/2010 0924   BILITOT 0.2* 12/24/2010 0924   GFRNONAA 74* 08/06/2012 0923   GFRAA 85* 08/06/2012 0923       Component Value Date/Time   WBC 11.1* 08/06/2012 0923   WBC 13.3* 01/04/2011 0606   WBC 14.2* 01/03/2011 0520   HGB 14.6 08/06/2012 0923   HGB 10.3* 01/04/2011 0606   HGB 10.4* 01/03/2011 0520   HCT 43.0 08/06/2012 0923   HCT 31.1* 01/04/2011 0606   HCT 31.1* 01/03/2011 0520   MCV 90.0 08/06/2012 0923   MCV 92.8 01/04/2011 0606   MCV 92.0 01/03/2011 0520    Lipid Panel     Component Value Date/Time   CHOL 107 03/24/2010 1853   TRIG 127 03/24/2010 1853   HDL 33* 03/24/2010 1853   CHOLHDL 3.2 Ratio 03/24/2010 1853   VLDL 25 03/24/2010 1853   LDLCALC 49 03/24/2010 1853    ABG No results found for this basename: phart, pco2, pco2art, po2, po2art, hco3, tco2, acidbasedef, o2sat     Lab Results  Component Value Date   TSH 0.934 11/27/2010   BNP (last 3 results) No results found for this basename: PROBNP,  in the last 8760 hours Cardiac Panel (last 3 results) No results found for this basename: CKTOTAL, CKMB, TROPONINI, RELINDX,  in the last 72 hours  Iron/TIBC/Ferritin No results found for this basename: iron,  tibc, ferritin     EKG Orders placed in visit on 07/08/12  . EKG 12-LEAD     Prior Assessment and Plan Problem List as of  10/06/2012   Diabetes mellitus type II   Last Assessment & Plan   12/17/2011 Office Visit Written 12/17/2011 12:03 PM by Kathlen Brunswick, MD     Excellent control of diabetes with current therapy.    HYPERLIPIDEMIA   Last Assessment & Plan   07/08/2012 Office Visit Written 07/08/2012  3:57 PM by Jodelle Gross, NP     He is followed by Dr. Renard Matter and labs will be completed by him on follow up.    OBESITY   Last Assessment & Plan   07/08/2012 Office Visit Written 07/08/2012  3:58 PM by Jodelle Gross, NP     Weight loss is recommended. Hopefully after surgery he can begin to be more active which will assist in this.    TOBACCO ABUSE   Last Assessment & Plan   12/17/2011 Office Visit Written 12/17/2011 12:07 PM by Kathlen Brunswick, MD     Continues to express no interest in decreasing or discontinuing tobacco use.    HYPERTENSION   Last Assessment & Plan   07/08/2012 Office Visit Written 07/08/2012  3:56 PM by Jodelle Gross, NP     Blood pressure well controlled at present. I am concerned that with use of chlorthalidone he may have hypokalemia, as he has had in the past. He stopped taking replacement as this gave him "chest pressure." I will repeat BMET for evaluation. He is on ACE inhibitor, which will help with  Potassium status. I have discussed that he will need replacement for optimal electrolyte balance perioperatively. His last stress test in 2012 was negative for ischemia. He can proceed with back surgery if BMET is WNL.     DJD (degenerative joint disease)   Last Assessment & Plan   12/17/2010 Office Visit Edited 12/20/2010  2:45 PM by Kathlen Brunswick, MD     Patient tells me that surgery scheduled for 2 weeks hence.  In the absence of known coronary disease and with a negative stress echocardiogram, his surgical risk for the proposed procedure is quite acceptable.  No additional treatment or testing would significantly impact the magnitude of that risk.  [facility]  will be available as needed in hospital.    Hypokalemia   Last Assessment & Plan   07/08/2012 Office Visit Written 07/08/2012  3:57 PM by Jodelle Gross, NP     Follow up BMET is ordered for evaluation and need to restart replacement should this be necessary prior to surgery.    Spondylolisthesis of lumbar region       Imaging: No results found.

## 2012-10-06 NOTE — Progress Notes (Signed)
Patient ID: AVANT PRINTY, male   DOB: 1950/10/15, 62 y.o.   MRN: [id number]  HPI: Schedule return visit for this nice gentleman with multiple cardiovascular risk factors but no known vascular disease. Since his last visit, he underwent uncomplicated lumbosacral spine surgery, which has relieved chronic low back pain, for the most part. He has completed formal rehabilitation, but continues to walk with a cane and gradually increase activity level.  He has been treated with Chantix to assist with smoking cessation allowing him to taper cigarette consumption from 2 packs to 0.5-0.75 pack per day.  That medication is resulted in mild GI distress, which the patient identifies as the reason for recent weight loss.  Current Outpatient Prescriptions  Medication Sig Dispense Refill  . ACCU-CHEK AVIVA PLUS test strip       . amLODipine-benazepril (LOTREL) 10-40 MG per capsule Take 1 capsule by mouth daily.      Marland Kitchen aspirin 81 MG tablet Take 81 mg by mouth daily.      . chlorthalidone (HYGROTON) 25 MG tablet Take 25 mg by mouth daily.      . Cholecalciferol (VITAMIN D) 1000 UNITS capsule Take 1,000 Units by mouth daily.        . Choline Fenofibrate (TRILIPIX) 135 MG capsule Take 135 mg by mouth daily.        Marland Kitchen doxazosin (CARDURA) 4 MG tablet Take 4 mg by mouth daily.      Marland Kitchen esomeprazole (NEXIUM) 40 MG capsule Take 40 mg by mouth daily.        Marland Kitchen ezetimibe-simvastatin (VYTORIN) 10-40 MG per tablet Take 1 tablet by mouth at bedtime.        Marland Kitchen glipiZIDE (GLUCOTROL) 10 MG tablet Take 10 mg by mouth daily.        . meloxicam (MOBIC) 15 MG tablet       . metFORMIN (GLUCOPHAGE) 500 MG tablet Take 500-1,000 mg by mouth 2 (two) times daily. Takes 2 tab in am and 1 tab in pm.      . methocarbamol (ROBAXIN) 500 MG tablet Take 1 tablet (500 mg total) by mouth every 8 (eight) hours as needed (muscle spasms).  90 tablet  0  . metoprolol succinate (TOPROL-XL) 100 MG 24 hr tablet Take 100 mg by mouth daily. Take with or  immediately following a meal.      . niacin (NIASPAN) 500 MG CR tablet Take 3 tablets (1,500 mg total) by mouth at bedtime.  90 tablet  1  . oxyCODONE-acetaminophen (PERCOCET) 10-325 MG per tablet       . sildenafil (VIAGRA) 50 MG tablet Take 50 mg by mouth daily as needed for erectile dysfunction.      . varenicline (CHANTIX) 1 MG tablet Take 1 mg by mouth 2 (two) times daily.      Marland Kitchen HYDROcodone-acetaminophen (NORCO) 10-325 MG per tablet Take 1 tablet by mouth every 4 (four) hours as needed for pain.  100 tablet  0  . HYDROcodone-acetaminophen (VICODIN ES) 7.5-750 MG per tablet Take 1 tablet by mouth every 6 (six) hours as needed for pain.        No current facility-administered medications for this visit.   Allergies  Allergen Reactions  . Catapres (Clonidine Hydrochloride) Rash    Transdermal-skin eruption     Past medical history, social history, and family history reviewed and updated.  ROS: Denies dyspnea, chest discomfort, peripheral edema, palpitations, lightheadedness or syncope. All other systems reviewed and are negative.   PHYSICAL EXAM:  BP 108/69  Pulse 90  Ht 6\' 1"  (1.854 m)  Wt 126.554 kg (279 lb)  BMI 36.82 kg/m2;  Body mass index is 36.82 kg/(m^2). General-Well developed; no acute distress Body habitus-moderately overweight Neck-No JVD; no carotid bruits Lungs-inspiratory next or rhonchi; extreme wheezing with moderately prolonged expiratory phase; resonant to percussion Cardiovascular-normal PMI; normal S1 and S2 Abdomen-normal bowel sounds; soft and non-tender without masses or organomegaly Musculoskeletal-No deformities, no cyanosis or clubbing Neurologic-Normal cranial nerves; symmetric strength and tone Skin-Warm, no significant lesions Extremities-distal pulses intact; no edema  [place] Bing, MD 10/06/2012  2:08 PM  ASSESSMENT AND PLAN

## 2012-10-06 NOTE — Assessment & Plan Note (Signed)
Potassium rich diet recommended. Electrolytes will be repeated in 4 months.

## 2012-10-06 NOTE — Assessment & Plan Note (Addendum)
Patient has moderated tobacco consumption while taking Chantix.

## 2012-10-06 NOTE — Assessment & Plan Note (Signed)
The patient has achieved substantial pain relief following surgery. With continued effort, mobility and strength should improve as well.

## 2012-10-06 NOTE — Assessment & Plan Note (Addendum)
Control of blood pressure has been excellent; current medication will be continued.

## 2012-10-06 NOTE — Patient Instructions (Addendum)
Your physician recommends that you schedule a follow-up appointment in: ONE YEAR  Your physician discussed the hazards of tobacco use. Tobacco use cessation is recommended and techniques and options to help you quit were discussed.  Your physician recommends that you return for lab work in: FOUR MONTHS Your physician recommends that you have follow up lab work, we will mail you a reminder letter to alert you when to go [employer], located across the street from our office.    Your physician encouraged you to lose weight for better health.  Your physician recommends THAT YOU INCREASE POTASSIUM IN YOUR DIET   Foods Rich in Potassium The body needs potassium to:  Control blood pressure.   Keep the muscles healthy.   Keep the nervous system healthy.  Most foods contain potassium. Eating a variety of foods in the right amounts will help control the level of potassium in your body.  Food / Potassium (mg)  Apricots, dried,  cup / 378 mg   Apricots, raw, 1 cup halves / 401 mg   Avocado,  / 487 mg   Banana, 1 large / 487 mg   Beef, lean, round, 3 oz / 202 mg   Cantaloupe, 1 cup cubes / 427 mg   Dates, medjool, 5 whole / 835 mg   Ham, cured, 3 oz / 212 mg   Lentils, dried,  cup / 458 mg   Lima beans, frozen,  cup / 258 mg   Orange, 1 large / 333 mg   Orange juice, 1 cup / 443 mg   Peaches, dried,  cup / 398 mg   Peas, split, cooked,  cup / 355 mg   Potato, boiled, 1 medium / 515 mg   Prunes, dried, uncooked,  cup / 318 mg   Raisins,  cup / 309 mg   Salmon, pink, raw, 3 oz / 275 mg   Sardines, canned , 3 oz / 338 mg   Tomato, raw, 1 medium / 292 mg   Tomato juice, 6 oz / 417 mg   [place], 3 oz / 349 mg  Other Foods High in Potassium (greater than 250 mg):  Bran cereals and other bran products.   Milk (skim, 1%, 2%, whole).   Buttermilk.   Yogurt.   Nuts.   Dried fruits.   Cherries.   Sweet potatoes.   Oranges.   Baked Beans.    Broccoli.   Spinach.   Peanut butter.   Tofu.  Foods Lower in Potassium (less than 250 mg):  Pasta.   Duplechain.   Cottage cheese.   Cheddar cheese.   Apples.   Mango.   Grapes.   Grapefruit.   Pineapple.   Raspberries.   Strawberries.   Watermelon.   Green Beans.   Cabbage.   Carrots.   Cauliflower.   Celery.   Corn.    Mushrooms.   Onions.   Squash.   Eggs.  The list below tells you how big or small some common portion sizes are:  1 oz.........4 stacked dice.   3 oz........Marland KitchenDeck of cards.   1 tsp.......Marland KitchenTip of little finger.   1 tbs......Marland KitchenMarland KitchenThumb.   2 tbs.......Marland KitchenGolf ball.    cup......Marland KitchenHalf of a fist.   1 cup.......Marland KitchenA fist.  Document Released: 10/23/2007 Document Revised: 01/16/2011 Document Reviewed: 09/19/2008 [facility] Patient Information 2012 [place], [place].

## 2012-10-16 ENCOUNTER — Other Ambulatory Visit: Payer: Self-pay | Admitting: *Deleted

## 2012-10-16 MED ORDER — CHLORTHALIDONE 25 MG PO TABS: 25.0000 mg | ORAL_TABLET | Freq: Every day | ORAL | Status: DC

## 2012-11-17 ENCOUNTER — Other Ambulatory Visit: Payer: Self-pay | Admitting: Cardiology

## 2012-11-17 NOTE — Telephone Encounter (Signed)
Medication sent via escribe for niacin (NIASPAN) 500 MG CR tablet

## 2012-12-24 ENCOUNTER — Other Ambulatory Visit (HOSPITAL_COMMUNITY): Payer: Self-pay | Admitting: Family Medicine

## 2012-12-24 DIAGNOSIS — N6320 Unspecified lump in the left breast, unspecified quadrant: Secondary | ICD-10-CM

## 2012-12-28 ENCOUNTER — Other Ambulatory Visit: Payer: Self-pay | Admitting: Cardiology

## 2013-01-06 ENCOUNTER — Ambulatory Visit (HOSPITAL_COMMUNITY)
Admission: RE | Admit: 2013-01-06 | Discharge: 2013-01-06 | Disposition: A | Discharge status: Home / Self Care | Payer: 59 | Source: Ambulatory Visit | Attending: Family Medicine | Admitting: Family Medicine

## 2013-01-06 DIAGNOSIS — N6320 Unspecified lump in the left breast, unspecified quadrant: Secondary | ICD-10-CM

## 2013-01-06 DIAGNOSIS — Z1231 Encounter for screening mammogram for malignant neoplasm of breast: Secondary | ICD-10-CM | POA: Insufficient documentation

## 2013-01-19 ENCOUNTER — Other Ambulatory Visit: Payer: Self-pay | Admitting: *Deleted

## 2013-01-19 ENCOUNTER — Encounter: Payer: Self-pay | Admitting: *Deleted

## 2013-01-19 DIAGNOSIS — I10 Essential (primary) hypertension: Secondary | ICD-10-CM

## 2013-02-26 LAB — BASIC METABOLIC PANEL
BUN: 23 mg/dL (ref 6–23)
Calcium: 9.4 mg/dL (ref 8.4–10.5)
Potassium: 3.7 mEq/L (ref 3.5–5.3)

## 2013-03-01 ENCOUNTER — Other Ambulatory Visit: Payer: Self-pay | Admitting: Cardiology

## 2013-03-02 ENCOUNTER — Encounter: Payer: Self-pay | Admitting: *Deleted

## 2013-03-02 ENCOUNTER — Encounter: Payer: Self-pay | Admitting: Cardiology

## 2013-04-02 ENCOUNTER — Other Ambulatory Visit: Payer: Self-pay | Admitting: Cardiology

## 2013-04-09 ENCOUNTER — Other Ambulatory Visit (HOSPITAL_COMMUNITY): Payer: Self-pay | Admitting: Family Medicine

## 2013-04-09 ENCOUNTER — Ambulatory Visit (HOSPITAL_COMMUNITY)
Admission: RE | Admit: 2013-04-09 | Discharge: 2013-04-09 | Disposition: A | Discharge status: Home / Self Care | Payer: 59 | Source: Ambulatory Visit | Attending: Family Medicine | Admitting: Family Medicine

## 2013-04-09 DIAGNOSIS — F17210 Nicotine dependence, cigarettes, uncomplicated: Secondary | ICD-10-CM

## 2013-04-09 DIAGNOSIS — M47814 Spondylosis without myelopathy or radiculopathy, thoracic region: Secondary | ICD-10-CM | POA: Insufficient documentation

## 2013-04-09 DIAGNOSIS — F17200 Nicotine dependence, unspecified, uncomplicated: Secondary | ICD-10-CM | POA: Insufficient documentation

## 2013-04-09 DIAGNOSIS — I10 Essential (primary) hypertension: Secondary | ICD-10-CM | POA: Insufficient documentation

## 2013-05-17 ENCOUNTER — Other Ambulatory Visit: Payer: Self-pay | Admitting: Cardiology

## 2013-06-17 ENCOUNTER — Other Ambulatory Visit: Payer: Self-pay | Admitting: Cardiology

## 2013-10-22 ENCOUNTER — Telehealth: Payer: Self-pay | Admitting: Adult Health

## 2013-10-22 MED ORDER — AMLODIPINE BESY-BENAZEPRIL HCL 10-40 MG PO CAPS: ORAL_CAPSULE | ORAL | Status: DC

## 2013-10-22 MED ORDER — DOXAZOSIN MESYLATE 4 MG PO TABS: ORAL_TABLET | ORAL | Status: DC

## 2013-10-22 NOTE — Telephone Encounter (Signed)
Medication sent to pharmacy  

## 2013-10-22 NOTE — Telephone Encounter (Signed)
Received fax refill request  Rx # [id number] Medication:  Doxazosin 4 mg tablet Qty 30 Sig:  Take one tablet by mouth daily Physician:  Purcell Nails Received fax refill request  Rx # [id number] Medication:  Amlodipine Benazepril 10 / 4 Qty 30 Sig:  Take one capsule by mouth once daily Physician: Harl Bowie

## 2013-11-15 ENCOUNTER — Encounter: Payer: Self-pay | Admitting: Cardiology

## 2013-11-15 ENCOUNTER — Ambulatory Visit (INDEPENDENT_AMBULATORY_CARE_PROVIDER_SITE_OTHER): Payer: 59 | Admitting: Cardiology

## 2013-11-15 VITALS — BP 120/72 | HR 81 | Ht 73.0 in | Wt 300.0 lb

## 2013-11-15 DIAGNOSIS — E785 Hyperlipidemia, unspecified: Secondary | ICD-10-CM

## 2013-11-15 DIAGNOSIS — E118 Type 2 diabetes mellitus with unspecified complications: Secondary | ICD-10-CM

## 2013-11-15 DIAGNOSIS — I10 Essential (primary) hypertension: Secondary | ICD-10-CM

## 2013-11-15 NOTE — Patient Instructions (Addendum)
Your physician wants you to follow-up in: 1 year You will receive a reminder letter in the mail two months in advance. If you don't receive a letter, please call our office to schedule the follow-up appointment.     Your physician recommends that you continue on your current medications as directed. Please refer to the Current Medication list given to you today.     Please get blood work when you have not had anything to eat or drink 12 hrs before test  (CMET,CBC,TSH,LIPID,HgA1C,PSA)     Thank you for choosing [facility] !

## 2013-11-15 NOTE — Progress Notes (Signed)
Clinical Summary [name] is a 63 y.o.male former patient of Dr Lattie Haw, this is our first visit together. He is seen for the following medical problems.  1. HTN - does not check regularly - compliant with meds  2. Hyperlipidemia -  compliant with meds  3. Tobacco abuse - still smoking 2 ppd - chantix caused nightmares - not interested in patches at this time  4. OSA? - + daytime somnolence, + snoring.   Past Medical History  Diagnosis Date  . HTN (hypertension)   . HLD (hyperlipidemia)   . Tobacco abuse     70 pack years  . Abnormal EKG     negative stress nuclear-2005; probable inferolateral scarring in 2007- negative EKG at 10 mets   . GERD (gastroesophageal reflux disease)   . 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 Obesity   . Shortness of breath     exertion  . Diabetes mellitus     AODM- no insulin- fasting cbg 100-150  . H/O hiatal hernia   . Hx of dizziness      Allergies  Allergen Reactions  . Catapres [Clonidine Hydrochloride] Rash    Transdermal-skin eruption     Current Outpatient Prescriptions  Medication Sig Dispense Refill  . ACCU-CHEK AVIVA PLUS test strip       . amLODipine-benazepril (LOTREL) 10-40 MG per capsule TAKE (1) CAPSULE BY MOUTH ONCE DAILY.  30 capsule  3  . aspirin 81 MG tablet Take 81 mg by mouth daily.      . chlorthalidone (HYGROTON) 25 MG tablet TAKE ONE TABLET BY MOUTH ONCE DAILY.  30 tablet  6  . Cholecalciferol (VITAMIN D) 1000 UNITS capsule Take 1,000 Units by mouth daily.        . Choline Fenofibrate (TRILIPIX) 135 MG capsule Take 135 mg by mouth daily.        Marland Kitchen doxazosin (CARDURA) 4 MG tablet TAKE (1) TABLET BY MOUTH DAILY.  30 tablet  6  . esomeprazole (NEXIUM) 40 MG capsule Take 40 mg by mouth daily.        Marland Kitchen ezetimibe-simvastatin (VYTORIN) 10-40 MG per tablet Take 1 tablet by mouth at bedtime.        Marland Kitchen glipiZIDE (GLUCOTROL) 10 MG tablet Take 10 mg by mouth daily.        Marland Kitchen  HYDROcodone-acetaminophen (NORCO) 10-325 MG per tablet Take 1 tablet by mouth every 4 (four) hours as needed for pain.  100 tablet  0  . HYDROcodone-acetaminophen (VICODIN ES) 7.5-750 MG per tablet Take 1 tablet by mouth every 6 (six) hours as needed for pain.       . meloxicam (MOBIC) 15 MG tablet       . metFORMIN (GLUCOPHAGE) 500 MG tablet Take 500-1,000 mg by mouth 2 (two) times daily. Takes 2 tab in am and 1 tab in pm.      . methocarbamol (ROBAXIN) 500 MG tablet Take 1 tablet (500 mg total) by mouth every 8 (eight) hours as needed (muscle spasms).  90 tablet  0  . metoprolol succinate (TOPROL-XL) 100 MG 24 hr tablet TAKE ONE TABLET BY MOUTH ONCE DAILY.  30 tablet  6  . niacin (NIASPAN) 500 MG CR tablet TAKE 3 TABLETS BY MOUTH AT BEDTIME.  90 tablet  3  . oxyCODONE-acetaminophen (PERCOCET) 10-325 MG per tablet       . sildenafil (VIAGRA) 50 MG tablet Take 50 mg by mouth daily as  needed for erectile dysfunction.      . varenicline (CHANTIX) 1 MG tablet Take 1 mg by mouth 2 (two) times daily.       No current facility-administered medications for this visit.     Past Surgical History  Procedure Laterality Date  . Total hip arthroplasty  2005    Left  . Hernia repair  1997  . Lumbar spine surgery  2004    Laminectomy and fusion  . Cataract extraction  2011-12    Bilateral  . Joint replacement      bilateral hip      Allergies  Allergen Reactions  . Catapres [Clonidine Hydrochloride] Rash    Transdermal-skin eruption      Family History  Problem Relation Age of Onset  . Stroke Mother      Social History Mr. Suhr reports that he has been smoking Cigarettes.  He has a 100 pack-year smoking history. He has never used smokeless tobacco. Mr. Odem reports that he drinks alcohol.   Review of Systems CONSTITUTIONAL: No weight loss, fever, chills, weakness or fatigue.  HEENT: Eyes: No visual loss, blurred vision, double vision or yellow sclerae.No hearing loss, sneezing,  congestion, runny nose or sore throat.  SKIN: No rash or itching.  CARDIOVASCULAR: per HPI RESPIRATORY: No shortness of breath, cough or sputum.  GASTROINTESTINAL: No anorexia, nausea, vomiting or diarrhea. No abdominal pain or blood.  GENITOURINARY: No burning on urination, no polyuria NEUROLOGICAL: No headache, dizziness, syncope, paralysis, ataxia, numbness or tingling in the extremities. No change in bowel or bladder control.  MUSCULOSKELETAL: No muscle, back pain, joint pain or stiffness.  LYMPHATICS: No enlarged nodes. No history of splenectomy.  PSYCHIATRIC: No history of depression or anxiety.  ENDOCRINOLOGIC: No reports of sweating, cold or heat intolerance. No polyuria or polydipsia.  Marland Kitchen   Physical Examination p 81 bp 120/72 Wt 300 lbs BMI 40 Gen: resting comfortably, no acute distress HEENT: no scleral icterus, pupils equal round and reactive, no palptable cervical adenopathy,  CV: RRR, no m/r/g, no JVD, no carotid bruits Resp: Clear to auscultation bilaterally GI: abdomen is soft, non-tender, non-distended, normal bowel sounds, no hepatosplenomegaly MSK: extremities are warm, no edema.  Skin: warm, no rash Neuro:  no focal deficits Psych: appropriate affect   Diagnostic Studies  11/2010 Dobutamine Stress Echo Study Conclusions - baseline LVEF 55-60% - Stress ECG conclusions: There were no significant stress arrhythmias or conduction abnormalities; rare PVC. The stress ECG wasborderline positivefor ischemia-T waves became bi-phasic, and 1-1.5 mm of flat ST depression was noted in the inferior leads. - Staged echo: There was no echocardiographic evidence for stress-induced ischemia.     Assessment and Plan  1. HTN - at goal, continue current meds  2. Hyperlipidemia - no recent panel in our system, will order panel - he has been on multiple agents (simva,zetia, niacin, fenofibrate). Pending his panel, will consider cutting back on regimen as only few have been  shown to have significant clinical outcomes benefit and he is on several medications.   3. Tobacco abuase - counseled on health risks of smoking and advised to quit. He is not interested on nicotine replacement at this time, wants to try on his own.  4. ?OSA - several risk factors for possible OSA. Counseled on long term health benefits of treatment if he truly has OSA. He is not interested in sleep study at this time. Readdress next visit    F/u 1 year    Arnoldo Lenis, M.D.,  F.A.C.C.

## 2013-11-26 ENCOUNTER — Other Ambulatory Visit: Payer: Self-pay | Admitting: Cardiology

## 2013-12-09 LAB — CBC
HCT: 42.1 % (ref 39.0–52.0)
Hemoglobin: 14 g/dL (ref 13.0–17.0)
MCH: 29.9 pg (ref 26.0–34.0)
MCHC: 33.3 g/dL (ref 30.0–36.0)
MCV: 90 fL (ref 78.0–100.0)
Platelets: 313 10*3/uL (ref 150–400)
RBC: 4.68 MIL/uL (ref 4.22–5.81)
RDW: 14.2 % (ref 11.5–15.5)
WBC: 13.3 10*3/uL — ABNORMAL HIGH (ref 4.0–10.5)

## 2013-12-09 LAB — COMPREHENSIVE METABOLIC PANEL WITH GFR
ALT: 16 U/L (ref 0–53)
AST: 14 U/L (ref 0–37)
Albumin: 4 g/dL (ref 3.5–5.2)
Alkaline Phosphatase: 58 U/L (ref 39–117)
BUN: 23 mg/dL (ref 6–23)
CO2: 27 meq/L (ref 19–32)
Calcium: 9.9 mg/dL (ref 8.4–10.5)
Chloride: 100 meq/L (ref 96–112)
Creat: 1.22 mg/dL (ref 0.50–1.35)
Glucose, Bld: 134 mg/dL — ABNORMAL HIGH (ref 70–99)
Potassium: 3.8 meq/L (ref 3.5–5.3)
Sodium: 138 meq/L (ref 135–145)
Total Bilirubin: 0.4 mg/dL (ref 0.2–1.2)
Total Protein: 6.6 g/dL (ref 6.0–8.3)

## 2013-12-09 LAB — LIPID PANEL
Cholesterol: 113 mg/dL (ref 0–200)
HDL: 30 mg/dL — ABNORMAL LOW (ref >39)
LDL CALC: 25 mg/dL (ref 0–99)
Total CHOL/HDL Ratio: 3.8 Ratio
Triglycerides: 289 mg/dL — ABNORMAL HIGH (ref <150)
VLDL: 58 mg/dL — AB (ref 0–40)

## 2013-12-09 LAB — HEMOGLOBIN A1C
Hgb A1c MFr Bld: 7.1 % — ABNORMAL HIGH
Mean Plasma Glucose: 157 mg/dL — ABNORMAL HIGH

## 2013-12-09 LAB — TSH: TSH: 0.825 u[IU]/mL (ref 0.350–4.500)

## 2013-12-10 LAB — PSA: PSA: 0.62 ng/mL (ref <=4.00)

## 2013-12-28 ENCOUNTER — Other Ambulatory Visit: Payer: Self-pay | Admitting: Cardiology

## 2014-03-01 ENCOUNTER — Other Ambulatory Visit: Payer: Self-pay | Admitting: Cardiology

## 2014-06-01 ENCOUNTER — Other Ambulatory Visit: Payer: Self-pay | Admitting: Cardiology

## 2014-07-05 ENCOUNTER — Other Ambulatory Visit: Payer: Self-pay | Admitting: Cardiology

## 2014-07-05 MED ORDER — CHLORTHALIDONE 25 MG PO TABS: 25.0000 mg | ORAL_TABLET | Freq: Every day | ORAL | Status: DC

## 2014-07-05 NOTE — Telephone Encounter (Signed)
Received fax refill request  Rx # [id number] Medication:  Chlorthalidone 25 mg tablet Qty 30 Sig:  Take one tablet by mouth once daily Physician:  Harl Bowie

## 2014-07-05 NOTE — Telephone Encounter (Signed)
escribed refill

## 2014-08-08 ENCOUNTER — Other Ambulatory Visit: Payer: Self-pay | Admitting: Cardiology

## 2014-10-11 ENCOUNTER — Other Ambulatory Visit: Payer: Self-pay | Admitting: Cardiology

## 2014-11-14 ENCOUNTER — Other Ambulatory Visit: Payer: Self-pay | Admitting: Cardiology

## 2014-11-22 ENCOUNTER — Ambulatory Visit (INDEPENDENT_AMBULATORY_CARE_PROVIDER_SITE_OTHER): Payer: Medicare Other | Admitting: Cardiology

## 2014-11-22 VITALS — BP 116/68 | HR 72 | Ht 73.0 in | Wt 307.0 lb

## 2014-11-22 DIAGNOSIS — E785 Hyperlipidemia, unspecified: Secondary | ICD-10-CM

## 2014-11-22 DIAGNOSIS — R0602 Shortness of breath: Secondary | ICD-10-CM

## 2014-11-22 DIAGNOSIS — R600 Localized edema: Secondary | ICD-10-CM

## 2014-11-22 DIAGNOSIS — I10 Essential (primary) hypertension: Secondary | ICD-10-CM

## 2014-11-22 MED ORDER — PRAVASTATIN SODIUM 80 MG PO TABS: 80.0000 mg | ORAL_TABLET | Freq: Every evening | ORAL | Status: DC

## 2014-11-22 MED ORDER — FUROSEMIDE 20 MG PO TABS: ORAL_TABLET | ORAL | Status: DC

## 2014-11-22 NOTE — Patient Instructions (Addendum)
Your physician recommends that you schedule a follow-up appointment in: 2 MONTHS with Dr. Jeannetta Ellis NIACIN  STOP VYTORIN  START LASIX 20 MG DAILY AS NEEDED FOR LEG SWELLING  START PRAVASTATIN 80 MG DAILY   DASH Eating Plan DASH stands for "Dietary Approaches to Stop Hypertension." The DASH eating plan is a healthy eating plan that has been shown to reduce high blood pressure (hypertension). Additional health benefits may include reducing the risk of type 2 diabetes mellitus, heart disease, and stroke. The DASH eating plan may also help with weight loss. WHAT DO I NEED TO KNOW ABOUT THE DASH EATING PLAN? For the DASH eating plan, you will follow these general guidelines:  Choose foods with a percent daily value for sodium of less than 5% (as listed on the food label).  Use salt-free seasonings or herbs instead of table salt or sea salt.  Check with your health care provider or pharmacist before using salt substitutes.  Eat lower-sodium products, often labeled as "lower sodium" or "no salt added."  Eat fresh foods.  Eat more vegetables, fruits, and low-fat dairy products.  Choose whole grains. Look for the word "whole" as the first word in the ingredient list.  Choose fish and skinless chicken or [place] more often than red meat. Limit fish, poultry, and meat to 6 oz (170 g) each day.  Limit sweets, desserts, sugars, and sugary drinks.  Choose heart-healthy fats.  Limit cheese to 1 oz (28 g) per day.  Eat more home-cooked food and less restaurant, buffet, and fast food.  Limit fried foods.  Cook foods using methods other than frying.  Limit canned vegetables. If you do use them, rinse them well to decrease the sodium.  When eating at a restaurant, ask that your food be prepared with less salt, or no salt if possible. WHAT FOODS CAN I EAT? Seek help from a dietitian for individual calorie needs. Grains Whole grain or whole wheat bread. Brown [place]. Whole grain or whole  wheat pasta. Quinoa, bulgur, and whole grain cereals. Low-sodium cereals. Corn or whole wheat flour tortillas. Whole grain cornbread. Whole grain crackers. Low-sodium crackers. Vegetables Fresh or frozen vegetables (raw, steamed, roasted, or grilled). Low-sodium or reduced-sodium tomato and vegetable juices. Low-sodium or reduced-sodium tomato sauce and paste. Low-sodium or reduced-sodium canned vegetables.  Fruits All fresh, canned (in natural juice), or frozen fruits. Meat and Other Protein Products Ground beef (85% or leaner), grass-fed beef, or beef trimmed of fat. Skinless chicken or [place]. Ground chicken or [place]. Pork trimmed of fat. All fish and seafood. Eggs. Dried beans, peas, or lentils. Unsalted nuts and seeds. Unsalted canned beans. Dairy Low-fat dairy products, such as skim or 1% milk, 2% or reduced-fat cheeses, low-fat ricotta or cottage cheese, or plain low-fat yogurt. Low-sodium or reduced-sodium cheeses. Fats and Oils Tub margarines without trans fats. Light or reduced-fat mayonnaise and salad dressings (reduced sodium). Avocado. Safflower, olive, or canola oils. Natural peanut or almond butter. Other Unsalted popcorn and pretzels. The items listed above may not be a complete list of recommended foods or beverages. Contact your dietitian for more options. WHAT FOODS ARE NOT RECOMMENDED? Grains White bread. White pasta. White [place]. Refined cornbread. Bagels and croissants. Crackers that contain trans fat. Vegetables Creamed or fried vegetables. Vegetables in a cheese sauce. Regular canned vegetables. Regular canned tomato sauce and paste. Regular tomato and vegetable juices. Fruits Dried fruits. Canned fruit in light or heavy syrup. Fruit juice. Meat and Other Protein Products Fatty cuts of  meat. Ribs, chicken wings, bacon, sausage, bologna, salami, chitterlings, fatback, hot dogs, bratwurst, and packaged luncheon meats. Salted nuts and seeds. Canned beans with  salt. Dairy Whole or 2% milk, cream, half-and-half, and cream cheese. Whole-fat or sweetened yogurt. Full-fat cheeses or blue cheese. Nondairy creamers and whipped toppings. Processed cheese, cheese spreads, or cheese curds. Condiments Onion and garlic salt, seasoned salt, table salt, and sea salt. Canned and packaged gravies. Worcestershire sauce. Tartar sauce. Barbecue sauce. Teriyaki sauce. Soy sauce, including reduced sodium. Steak sauce. Fish sauce. Oyster sauce. Cocktail sauce. Horseradish. Ketchup and mustard. Meat flavorings and tenderizers. Bouillon cubes. Hot sauce. Tabasco sauce. Marinades. Taco seasonings. Relishes. Fats and Oils Butter, stick margarine, lard, shortening, ghee, and bacon fat. Coconut, palm kernel, or palm oils. Regular salad dressings. Other Pickles and olives. Salted popcorn and pretzels. The items listed above may not be a complete list of foods and beverages to avoid. Contact your dietitian for more information. WHERE CAN I FIND MORE INFORMATION? National Heart, Lung, and Blood Institute: travelstabloid.com Document Released: 04/25/2011 Document Revised: 09/20/2013 Document Reviewed: 03/10/2013 [facility] Patient Information 2015 [place], [place]. This information is not intended to replace advice given to you by your health care provider. Make sure you discuss any questions you have with your health care provider.  Your physician has requested that you have an echocardiogram. Echocardiography is a painless test that uses sound waves to create images of your heart. It provides your doctor with information about the size and shape of your heart and how well your heart's chambers and valves are working. This procedure takes approximately one hour. There are no restrictions for this procedure.    Thanks for choosing [facility]!!!

## 2014-11-22 NOTE — Progress Notes (Signed)
Clinical Summary Sean [name] is a 64 y.o.male seen today or follow up of the following medical problems.   1. HTN - does not check regularly - compliant with meds  2. Hyperlipidemia - compliant with meds - 11/2013 TC 113 TG 289 HDL 30 LDL 25 - reports muscle aches on lipitor. Tolerating current regiment  3. Tobacco abuse - still smoking 2 ppd - chantix caused nightmares - has not been interested in nicotine replacement therapy.   4. OSA screen - + daytime somnolence, + snoring.  - discussed last visit, he has not been interested in sleep study  5. LE edema - notes LE over the last several weeks. Denies any SOB/DOE, no orthopnea or PND.    Past Medical History  Diagnosis Date  . HTN (hypertension)   . HLD (hyperlipidemia)   . Tobacco abuse     70 pack years  . Abnormal EKG     negative stress nuclear-2005; probable inferolateral scarring in 2007- negative EKG at 10 mets   . GERD (gastroesophageal reflux disease)   . 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 Obesity   . Shortness of breath     exertion  . Diabetes mellitus     AODM- no insulin- fasting cbg 100-150  . H/O hiatal hernia   . Hx of dizziness      No Active Allergies   Current Outpatient Prescriptions  Medication Sig Dispense Refill  . ACCU-CHEK AVIVA PLUS test strip     . amLODipine-benazepril (LOTREL) 10-40 MG per capsule TAKE (1) CAPSULE BY MOUTH ONCE DAILY. 30 capsule 9  . aspirin 81 MG tablet Take 81 mg by mouth daily.    . chlorthalidone (HYGROTON) 25 MG tablet Take 1 tablet (25 mg total) by mouth daily. 30 tablet 11  . Cholecalciferol (VITAMIN D) 1000 UNITS capsule Take 1,000 Units by mouth daily.      . Choline Fenofibrate (TRILIPIX) 135 MG capsule Take 135 mg by mouth daily.      Marland Kitchen doxazosin (CARDURA) 4 MG tablet TAKE (1) TABLET BY MOUTH DAILY. 30 tablet 6  . esomeprazole (NEXIUM) 40 MG capsule Take 40 mg by mouth daily.      Marland Kitchen ezetimibe-simvastatin  (VYTORIN) 10-40 MG per tablet Take 1 tablet by mouth at bedtime.      Marland Kitchen glipiZIDE (GLUCOTROL) 10 MG tablet Take 10 mg by mouth daily.      . meloxicam (MOBIC) 15 MG tablet     . metFORMIN (GLUCOPHAGE) 500 MG tablet Take 500-1,000 mg by mouth 2 (two) times daily. Takes 2 tab in am and 1 tab in pm.    . methocarbamol (ROBAXIN) 500 MG tablet Take 1 tablet (500 mg total) by mouth every 8 (eight) hours as needed (muscle spasms). 90 tablet 0  . metoprolol succinate (TOPROL-XL) 100 MG 24 hr tablet TAKE ONE TABLET BY MOUTH ONCE DAILY. 30 tablet 11  . niacin (NIASPAN) 500 MG CR tablet TAKE 3 TABLETS BY MOUTH AT BEDTIME. 90 tablet 3  . oxyCODONE-acetaminophen (PERCOCET) 10-325 MG per tablet     . sildenafil (VIAGRA) 50 MG tablet Take 50 mg by mouth daily as needed for erectile dysfunction.    . varenicline (CHANTIX) 1 MG tablet Take 1 mg by mouth 2 (two) times daily.     No current facility-administered medications for this visit.     Past Surgical History  Procedure Laterality Date  . Total hip arthroplasty  2005  Left  . Hernia repair  1997  . Lumbar spine surgery  2004    Laminectomy and fusion  . Cataract extraction  2011-12    Bilateral  . Joint replacement      bilateral hip      No Active Allergies    Family History  Problem Relation Age of Onset  . Stroke Mother      Social History [name] reports that he has been smoking Cigarettes.  He has a 100 pack-year smoking history. He has never used smokeless tobacco. [name] reports that he drinks alcohol.   Review of Systems CONSTITUTIONAL: No weight loss, fever, chills, weakness or fatigue.  HEENT: Eyes: No visual loss, blurred vision, double vision or yellow sclerae.No hearing loss, sneezing, congestion, runny nose or sore throat.  SKIN: No rash or itching.  CARDIOVASCULAR: per HPI RESPIRATORY: No shortness of breath, cough or sputum.  GASTROINTESTINAL: No anorexia, nausea, vomiting or diarrhea. No abdominal pain or  blood.  GENITOURINARY: No burning on urination, no polyuria NEUROLOGICAL: No headache, dizziness, syncope, paralysis, ataxia, numbness or tingling in the extremities. No change in bowel or bladder control.  MUSCULOSKELETAL: No muscle, back pain, joint pain or stiffness.  LYMPHATICS: No enlarged nodes. No history of splenectomy.  PSYCHIATRIC: No history of depression or anxiety.  ENDOCRINOLOGIC: No reports of sweating, cold or heat intolerance. No polyuria or polydipsia.  Marland Kitchen   Physical Examination Filed Vitals:   11/22/14 0912  BP: 116/68  Pulse: 72   Filed Vitals:   11/22/14 0912  Height: '6\' 1"'$  (1.854 m)  Weight: 307 lb (139.254 kg)    Gen: resting comfortably, no acute distress HEENT: no scleral icterus, pupils equal round and reactive, no palptable cervical adenopathy,  CV: RRR, no m/r/g, no JVD, no carotid bruits Resp: Clear to auscultation bilaterally GI: abdomen is soft, non-tender, non-distended, normal bowel sounds, no hepatosplenomegaly MSK: extremities are warm,2+ LE edema bilateral Skin: warm, no rash Neuro:  no focal deficits Psych: appropriate affect   Diagnostic Studies 11/2010 Dobutamine Stress Echo Study Conclusions - baseline LVEF 55-60% - Stress ECG conclusions: There were no significant stress arrhythmias or conduction abnormalities; rare PVC. The stress ECG wasborderline positivefor ischemia-T waves became bi-phasic, and 1-1.5 mm of flat ST depression was noted in the inferior leads. - Staged echo: There was no echocardiographic evidence for stress-induced ischemia.    Assessment and Plan  1. HTN - at goal, continue current meds  2. Hyperlipidemia - will simplify his regimen with focus on statin therapy. Outcome data for zetia and niacin limited. Will stop vytorin and niacin. Reports muscle aches on atorva, will start pravastatin '80mg'$  daily.  3. Tobacco abuse - counseled on health risks of smoking and advised to quit. He is not interested on  nicotine replacement at this time, wants to try on his own.  4. OSA screen - several risk factors for possible OSA. Counseled on long term health benefits of treatment if he truly has OSA. He is not interested in sleep study at this time  5. Leg swelling - request most recent labs from pcp - obtain echo to evalute for cardiac dysfunction - start lasix '20mg'$  prn  F/u 2 months    Arnoldo Lenis, M.D.

## 2014-11-23 ENCOUNTER — Encounter: Payer: Self-pay | Admitting: Cardiology

## 2014-11-25 ENCOUNTER — Ambulatory Visit (HOSPITAL_COMMUNITY)
Admission: RE | Admit: 2014-11-25 | Discharge: 2014-11-25 | Disposition: A | Discharge status: Home / Self Care | Payer: Medicare Other | Source: Ambulatory Visit | Attending: Cardiology | Admitting: Cardiology

## 2014-11-25 DIAGNOSIS — R0602 Shortness of breath: Secondary | ICD-10-CM

## 2014-12-12 ENCOUNTER — Other Ambulatory Visit: Payer: Self-pay | Admitting: Cardiology

## 2014-12-27 ENCOUNTER — Other Ambulatory Visit: Payer: Self-pay | Admitting: *Deleted

## 2014-12-27 MED ORDER — AMLODIPINE BESY-BENAZEPRIL HCL 10-40 MG PO CAPS: ORAL_CAPSULE | ORAL | Status: DC

## 2015-01-10 ENCOUNTER — Other Ambulatory Visit: Payer: Self-pay | Admitting: *Deleted

## 2015-01-10 MED ORDER — PRAVASTATIN SODIUM 80 MG PO TABS: 80.0000 mg | ORAL_TABLET | Freq: Every evening | ORAL | Status: DC

## 2015-01-10 MED ORDER — AMLODIPINE BESY-BENAZEPRIL HCL 10-40 MG PO CAPS: ORAL_CAPSULE | ORAL | Status: DC

## 2015-01-10 MED ORDER — DOXAZOSIN MESYLATE 4 MG PO TABS: 4.0000 mg | ORAL_TABLET | Freq: Every day | ORAL | Status: DC

## 2015-01-10 MED ORDER — CHLORTHALIDONE 25 MG PO TABS: 25.0000 mg | ORAL_TABLET | Freq: Every day | ORAL | Status: DC

## 2015-01-11 ENCOUNTER — Other Ambulatory Visit: Payer: Self-pay

## 2015-01-11 MED ORDER — METOPROLOL SUCCINATE ER 100 MG PO TB24: 100.0000 mg | ORAL_TABLET | Freq: Every day | ORAL | Status: DC

## 2015-01-11 MED ORDER — FUROSEMIDE 20 MG PO TABS
ORAL_TABLET | ORAL | Status: DC
Start: 2015-01-11 — End: 2015-02-09

## 2015-02-09 ENCOUNTER — Ambulatory Visit (INDEPENDENT_AMBULATORY_CARE_PROVIDER_SITE_OTHER): Payer: Medicare Other | Admitting: Cardiology

## 2015-02-09 ENCOUNTER — Encounter: Payer: Self-pay | Admitting: Cardiology

## 2015-02-09 VITALS — BP 122/78 | HR 77 | Ht 73.0 in | Wt 309.0 lb

## 2015-02-09 DIAGNOSIS — E785 Hyperlipidemia, unspecified: Secondary | ICD-10-CM

## 2015-02-09 DIAGNOSIS — R600 Localized edema: Secondary | ICD-10-CM | POA: Diagnosis not present

## 2015-02-09 DIAGNOSIS — I10 Essential (primary) hypertension: Secondary | ICD-10-CM

## 2015-02-09 MED ORDER — FUROSEMIDE 40 MG PO TABS: ORAL_TABLET | ORAL | Status: DC

## 2015-02-09 NOTE — Progress Notes (Signed)
Patient ID: Sean [name], male   DOB: 01-11-1951, 64 y.o.   MRN: [id number]     Clinical Summary Sean [name] is a 64 y.o.male seen today for follow up the following medical problems.   1. HTN - does not check regularly - compliant with meds  2. Hyperlipidemia - compliant with meds - 10/2014 TC 101 TG 129  HDL 32  LDL 43- reports muscle aches on lipitor. Tolerating pravastatin  3. Tobacco abuse - still smoking 2 ppd - chantix caused nightmares - has not been interested in nicotine replacement therapy.   4. OSA screen - + daytime somnolence, + snoring.  - he has not been interested in sleep study  5. LE edema - 11/2014 echo LVEF 00-17%, grade I diastolic dysfunction - swelling midly improved since starting prn lasix.    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 HTN (hypertension)   . HLD (hyperlipidemia)   . Tobacco abuse     70 pack years  . Abnormal EKG     negative stress nuclear-2005; probable inferolateral scarring in 2007- negative EKG at 10 mets   . GERD (gastroesophageal reflux disease)   . 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 Obesity   . Shortness of breath     exertion  . Diabetes mellitus     AODM- no insulin- fasting cbg 100-150  . H/O hiatal hernia   . Hx of dizziness      No Known Allergies   Current Outpatient Prescriptions  Medication Sig Dispense Refill  . ACCU-CHEK AVIVA PLUS test strip     . amLODipine-benazepril (LOTREL) 10-40 MG per capsule TAKE (1) CAPSULE BY MOUTH ONCE DAILY. 90 capsule 3  . aspirin 81 MG tablet Take 81 mg by mouth daily.    . chlorthalidone (HYGROTON) 25 MG tablet Take 1 tablet (25 mg total) by mouth daily. 30 tablet 6  . Cholecalciferol (VITAMIN D) 1000 UNITS capsule Take 1,000 Units by mouth daily.      . Choline Fenofibrate (TRILIPIX) 135 MG capsule Take 135 mg by mouth daily.      Marland Kitchen doxazosin (CARDURA) 4 MG tablet Take 1 tablet (4 mg total) by  mouth daily. 30 tablet 6  . esomeprazole (NEXIUM) 40 MG capsule Take 40 mg by mouth daily.      . furosemide (LASIX) 20 MG tablet TAKE 1 TABLET DAILY AS NEEDED FOR LEG SWELLING 90 tablet 3  . glipiZIDE (GLUCOTROL) 10 MG tablet Take 10 mg by mouth daily.      . meloxicam (MOBIC) 15 MG tablet     . metFORMIN (GLUCOPHAGE) 500 MG tablet Take 500-1,000 mg by mouth 2 (two) times daily. Takes 2 tab in am and 1 tab in pm.    . methocarbamol (ROBAXIN) 500 MG tablet Take 1 tablet (500 mg total) by mouth every 8 (eight) hours as needed (muscle spasms). 90 tablet 0  . metoprolol succinate (TOPROL-XL) 100 MG 24 hr tablet Take 1 tablet (100 mg total) by mouth daily. Take with or immediately following a meal. 30 tablet 11  . oxyCODONE-acetaminophen (PERCOCET) 10-325 MG per tablet     . pravastatin (PRAVACHOL) 80 MG tablet Take 1 tablet (80 mg total) by mouth every evening. 90 tablet 3  . sildenafil (VIAGRA) 50 MG tablet Take 50 mg by mouth daily as needed for erectile dysfunction.     No current facility-administered medications for this visit.  Past Surgical History  Procedure Laterality Date  . Total hip arthroplasty  2005    Left  . Hernia repair  1997  . Lumbar spine surgery  2004    Laminectomy and fusion  . Cataract extraction  2011-12    Bilateral  . Joint replacement      bilateral hip      No Known Allergies    Family History  Problem Relation Age of Onset  . Stroke Mother      Social History Sean [name] reports that he has been smoking Cigarettes.  He has a 100 pack-year smoking history. He has never used smokeless tobacco. Sean [name] reports that he drinks alcohol.   Review of Systems CONSTITUTIONAL: No weight loss, fever, chills, weakness or fatigue.  HEENT: Eyes: No visual loss, blurred vision, double vision or yellow sclerae.No hearing loss, sneezing, congestion, runny nose or sore throat.  SKIN: No rash or itching.  CARDIOVASCULAR: per HPI RESPIRATORY: No shortness of  breath, cough or sputum.  GASTROINTESTINAL: No anorexia, nausea, vomiting or diarrhea. No abdominal pain or blood.  GENITOURINARY: No burning on urination, no polyuria NEUROLOGICAL: No headache, dizziness, syncope, paralysis, ataxia, numbness or tingling in the extremities. No change in bowel or bladder control.  MUSCULOSKELETAL: No muscle, back pain, joint pain or stiffness.  LYMPHATICS: No enlarged nodes. No history of splenectomy.  PSYCHIATRIC: No history of depression or anxiety.  ENDOCRINOLOGIC: No reports of sweating, cold or heat intolerance. No polyuria or polydipsia.  Marland Kitchen   Physical Examination Filed Vitals:   02/09/15 0914  BP: 122/78  Pulse: 77   Filed Vitals:   02/09/15 0914  Height: '6\' 1"'$  (1.854 m)  Weight: 309 lb (140.161 kg)    Gen: resting comfortably, no acute distress HEENT: no scleral icterus, pupils equal round and reactive, no palptable cervical adenopathy,  CV: RRR, no m/r/g, no jvd Resp: Clear to auscultation bilaterally GI: abdomen is soft, non-tender, non-distended, normal bowel sounds, no hepatosplenomegaly MSK: extremities are warm, no edema.  Skin: warm, no rash Neuro:  no focal deficits Psych: appropriate affect   Diagnostic Studies 11/2010 Dobutamine Stress Echo Study Conclusions - baseline LVEF 55-60% - Stress ECG conclusions: There were no significant stress arrhythmias or conduction abnormalities; rare PVC. The stress ECG wasborderline positivefor ischemia-T waves became bi-phasic, and 1-1.5 mm of flat ST depression was noted in the inferior leads. - Staged echo: There was no echocardiographic evidence for stress-induced ischemia.   11/2014 echo Study Conclusions  - Left ventricle: The cavity size was normal. There was mild concentric hypertrophy. Systolic function was normal. The estimated ejection fraction was in the range of 60% to 65%. Wall motion was normal; there were no regional wall motion abnormalities. Doppler  parameters are consistent with abnormal left ventricular relaxation (grade 1 diastolic dysfunction). Doppler parameters are consistent with high ventricular filling pressure. - Aortic valve: Mildly calcified annulus. Trileaflet; mildly thickened leaflets. - Aorta: Aortic root upper normal limits in diameter. Aortic root dimension: 39 mm (ED). - Mitral valve: Mildly calcified annulus.   Assessment and Plan  1. HTN - at goal, continue current meds  2. Hyperlipidemia - continue pravastatin. Stop fenofibrate due to lack of data of clinical benefit  3. Tobacco abuse - counseled on health risks of smoking and advised to quit.  4. OSA screen - several risk factors for possible OSA. Counseled on long term health benefits of treatment if he truly has OSA. He is not interested in sleep study at this time  5. Leg swelling - will increase lasix to '40mg'$  prn, check BMET and Mg in 2 weeks   F/u 4 months      Arnoldo Lenis, M.D.

## 2015-02-09 NOTE — Patient Instructions (Signed)
Your physician wants you to follow-up in: 4 months with Dr.Branch You will receive a reminder letter in the mail two months in advance. If you don't receive a letter, please call our office to schedule the follow-up appointment.    STOP  Fenofibrate    Take Lasix 40 mg daily as needed for leg swelling      Your physician recommends that you return for lab work in: 2 weeks bmet,magnesium     Thank you for choosing [facility] !

## 2015-02-24 LAB — BASIC METABOLIC PANEL
BUN: 18 mg/dL (ref 7–25)
CO2: 31 mmol/L (ref 20–31)
CREATININE: 0.95 mg/dL (ref 0.70–1.25)
Calcium: 9.4 mg/dL (ref 8.6–10.3)
Chloride: 100 mmol/L (ref 98–110)
GLUCOSE: 139 mg/dL — AB (ref 65–99)
Potassium: 3.8 mmol/L (ref 3.5–5.3)
Sodium: 141 mmol/L (ref 135–146)

## 2015-02-24 LAB — MAGNESIUM: MAGNESIUM: 1.2 mg/dL — AB (ref 1.5–2.5)

## 2015-05-18 ENCOUNTER — Other Ambulatory Visit: Payer: Self-pay | Admitting: Cardiology

## 2015-06-19 ENCOUNTER — Telehealth: Payer: Self-pay | Admitting: General Practice

## 2015-06-19 NOTE — Telephone Encounter (Signed)
I called pt and got some triage info. He will bring a list of his meds and his insurance by to leave at front desk for me tomorrow.

## 2015-06-19 NOTE — Telephone Encounter (Signed)
Patient called in and stated he received a letter from [facility] in regards to his screening tcs.  He can be reached at [phone] anytime.

## 2015-06-21 NOTE — Addendum Note (Signed)
Addended by: Everardo All on: 06/21/2015 03:12 PM   Modules accepted: Medications

## 2015-06-27 ENCOUNTER — Telehealth: Payer: Self-pay

## 2015-06-27 NOTE — Telephone Encounter (Signed)
Gastroenterology Pre-Procedure Review  Request Date: 06/20/2015 Requesting Physician: Dr. Vernell Morgans  [facility])  PATIENT REVIEW QUESTIONS: The patient responded to the following health history questions as indicated:    1. Diabetes Melitis: YES 2. Joint replacements in the past 12 months: no 3. Major health problems in the past 3 months: no 4. Has an artificial valve or MVP: no 5. Has a defibrillator: no 6. Has been advised in past to take antibiotics in advance of a procedure like teeth cleaning: no 7. Family history of colon cancer: no  8. Alcohol Use: Has a shot at night 2-3 times a week 9. History of sleep apnea: no     MEDICATIONS & ALLERGIES:    Patient reports the following regarding taking any blood thinners:   Plavix? no Aspirin? YES Coumadin? no  Patient confirms/reports the following medications:  Current Outpatient Prescriptions  Medication Sig Dispense Refill  . amLODipine-benazepril (LOTREL) 10-40 MG per capsule TAKE (1) CAPSULE BY MOUTH ONCE DAILY. 90 capsule 3  . aspirin 81 MG tablet Take 81 mg by mouth daily.    . chlorthalidone (HYGROTON) 25 MG tablet Take 1 tablet by mouth  daily 90 tablet 0  . Cholecalciferol (VITAMIN D) 1000 UNITS capsule Take 1,000 Units by mouth daily.      Marland Kitchen doxazosin (CARDURA) 4 MG tablet Take 1 tablet by mouth  daily 90 tablet 0  . esomeprazole (NEXIUM) 40 MG capsule Take 40 mg by mouth daily.      . furosemide (LASIX) 40 MG tablet Take 40 mg daily as needed for leg swelling 90 tablet 3  . glipiZIDE (GLUCOTROL) 10 MG tablet Take 10 mg by mouth daily.      . magnesium oxide (MAG-OX) 400 MG tablet Take 400 mg by mouth daily.    . meloxicam (MOBIC) 15 MG tablet     . metFORMIN (GLUCOPHAGE) 500 MG tablet Take 500 mg by mouth 2 (two) times daily. Takes 2 tab in am    . metoprolol succinate (TOPROL-XL) 100 MG 24 hr tablet Take 1 tablet (100 mg total) by mouth daily. Take with or immediately following a meal. 30 tablet 11  .  oxyCODONE-acetaminophen (PERCOCET) 10-325 MG per tablet every 8 (eight) hours as needed.     . pravastatin (PRAVACHOL) 80 MG tablet Take 1 tablet (80 mg total) by mouth every evening. 90 tablet 3  . sildenafil (VIAGRA) 50 MG tablet Take 50 mg by mouth daily as needed for erectile dysfunction.    Marland Kitchen ACCU-CHEK AVIVA PLUS test strip     . methocarbamol (ROBAXIN) 500 MG tablet Take 1 tablet (500 mg total) by mouth every 8 (eight) hours as needed (muscle spasms). (Patient not taking: Reported on 06/21/2015) 90 tablet 0   No current facility-administered medications for this visit.    Patient confirms/reports the following allergies:  No Known Allergies  No orders of the defined types were placed in this encounter.    AUTHORIZATION INFORMATION Primary Insurance:  ID #:  Group #:  Pre-Cert / Auth required: Pre-Cert / Auth #:   Secondary Insurance:  ID #:  Group #:  Pre-Cert / Auth required: Pre-Cert / Auth #:   SCHEDULE INFORMATION: Procedure has been scheduled as follows:  Date:            Time:   Location:   This Gastroenterology Pre-Precedure Review Form is being routed to the following provider(s): Barney Drain, MD

## 2015-06-27 NOTE — Telephone Encounter (Signed)
PT NEED OPV DUE TO BMI > 40, POSSIBLE SLEEP APNEA.

## 2015-06-27 NOTE — Telephone Encounter (Signed)
See separate triage.  

## 2015-06-28 ENCOUNTER — Encounter: Payer: Self-pay | Admitting: Cardiology

## 2015-06-28 ENCOUNTER — Ambulatory Visit (INDEPENDENT_AMBULATORY_CARE_PROVIDER_SITE_OTHER): Payer: Medicare Other | Admitting: Cardiology

## 2015-06-28 VITALS — BP 110/68 | HR 87 | Ht 72.0 in | Wt 307.0 lb

## 2015-06-28 DIAGNOSIS — E785 Hyperlipidemia, unspecified: Secondary | ICD-10-CM | POA: Diagnosis not present

## 2015-06-28 DIAGNOSIS — R600 Localized edema: Secondary | ICD-10-CM | POA: Diagnosis not present

## 2015-06-28 DIAGNOSIS — I10 Essential (primary) hypertension: Secondary | ICD-10-CM | POA: Diagnosis not present

## 2015-06-28 NOTE — Telephone Encounter (Signed)
Pt has an OV with Walden Field, NP on 07/06/2015 at 10:30 Am.

## 2015-06-28 NOTE — Progress Notes (Signed)
Patient ID: Sean [name], male   DOB: [birth year], 65 y.o.   MRN: [id number]     Clinical Summary Sean [name] is a 65 y.o.male seen today for follow up of the following medical problems.   1. HTN - does not check bp at home regularly - compliant with meds  2. Hyperlipidemia - compliant with meds - 10/2014 TC 101 TG 129 HDL 32 LDL 43- reports muscle aches on lipitor. Tolerating pravastatin  3. Tobacco abuse - still smoking 2 ppd - chantix caused nightmares - has not been interested in nicotine replacement therapy.   4. OSA screen - + daytime somnolence, + snoring.  - he has not been interested in sleep study  5. LE edema - 11/2014 echo LVEF 84-13%, grade I diastolic dysfunction - well controlled with prn lasix.    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 HTN (hypertension)   . HLD (hyperlipidemia)   . Tobacco abuse     70 pack years  . Abnormal EKG     negative stress nuclear-2005; probable inferolateral scarring in 2007- negative EKG at 10 mets   . GERD (gastroesophageal reflux disease)   . 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 Obesity   . Shortness of breath     exertion  . Diabetes mellitus     AODM- no insulin- fasting cbg 100-150  . H/O hiatal hernia   . Hx of dizziness      No Known Allergies   Current Outpatient Prescriptions  Medication Sig Dispense Refill  . ACCU-CHEK AVIVA PLUS test strip     . amLODipine-benazepril (LOTREL) 10-40 MG per capsule TAKE (1) CAPSULE BY MOUTH ONCE DAILY. 90 capsule 3  . aspirin 81 MG tablet Take 81 mg by mouth daily.    . chlorthalidone (HYGROTON) 25 MG tablet Take 1 tablet by mouth  daily 90 tablet 0  . Cholecalciferol (VITAMIN D) 1000 UNITS capsule Take 1,000 Units by mouth daily.      Marland Kitchen doxazosin (CARDURA) 4 MG tablet Take 1 tablet by mouth  daily 90 tablet 0  . esomeprazole (NEXIUM) 40 MG capsule Take 40 mg by mouth daily.      . furosemide  (LASIX) 40 MG tablet Take 40 mg daily as needed for leg swelling 90 tablet 3  . glipiZIDE (GLUCOTROL) 10 MG tablet Take 10 mg by mouth daily.      . magnesium oxide (MAG-OX) 400 MG tablet Take 400 mg by mouth daily.    . meloxicam (MOBIC) 15 MG tablet     . metFORMIN (GLUCOPHAGE) 500 MG tablet Take 500 mg by mouth 2 (two) times daily. Takes 2 tab in am    . methocarbamol (ROBAXIN) 500 MG tablet Take 1 tablet (500 mg total) by mouth every 8 (eight) hours as needed (muscle spasms). (Patient not taking: Reported on 06/21/2015) 90 tablet 0  . metoprolol succinate (TOPROL-XL) 100 MG 24 hr tablet Take 1 tablet (100 mg total) by mouth daily. Take with or immediately following a meal. 30 tablet 11  . oxyCODONE-acetaminophen (PERCOCET) 10-325 MG per tablet every 8 (eight) hours as needed.     . pravastatin (PRAVACHOL) 80 MG tablet Take 1 tablet (80 mg total) by mouth every evening. 90 tablet 3  . sildenafil (VIAGRA) 50 MG tablet Take 50 mg by mouth daily as needed for erectile dysfunction.     No current facility-administered medications for this  visit.     Past Surgical History  Procedure Laterality Date  . Total hip arthroplasty  2005    Left  . Hernia repair  1997  . Lumbar spine surgery  2004    Laminectomy and fusion  . Cataract extraction  2011-12    Bilateral  . Joint replacement      bilateral hip      No Known Allergies    Family History  Problem Relation Age of Onset  . Stroke Mother      Social History Sean [name] reports that he has been smoking Cigarettes.  He has a 100 pack-year smoking history. He has never used smokeless tobacco. Sean [name] reports that he drinks alcohol.   Review of Systems CONSTITUTIONAL: No weight loss, fever, chills, weakness or fatigue.  HEENT: Eyes: No visual loss, blurred vision, double vision or yellow sclerae.No hearing loss, sneezing, congestion, runny nose or sore throat.  SKIN: No rash or itching.  CARDIOVASCULAR: per hpi RESPIRATORY: No  shortness of breath, cough or sputum.  GASTROINTESTINAL: No anorexia, nausea, vomiting or diarrhea. No abdominal pain or blood.  GENITOURINARY: No burning on urination, no polyuria NEUROLOGICAL: No headache, dizziness, syncope, paralysis, ataxia, numbness or tingling in the extremities. No change in bowel or bladder control.  MUSCULOSKELETAL: No muscle, back pain, joint pain or stiffness.  LYMPHATICS: No enlarged nodes. No history of splenectomy.  PSYCHIATRIC: No history of depression or anxiety.  ENDOCRINOLOGIC: No reports of sweating, cold or heat intolerance. No polyuria or polydipsia.  Marland Kitchen   Physical Examination Filed Vitals:   06/28/15 1548  BP: 110/68  Pulse: 87   Filed Vitals:   06/28/15 1548  Height: 6' (1.829 m)  Weight: 307 lb (139.254 kg)    Gen: resting comfortably, no acute distress HEENT: no scleral icterus, pupils equal round and reactive, no palptable cervical adenopathy,  CV: RRR, no m/r/g, no jvd Resp: Clear to auscultation bilaterally GI: abdomen is soft, non-tender, non-distended, normal bowel sounds, no hepatosplenomegaly MSK: extremities are warm, no edema.  Skin: warm, no rash Neuro:  no focal deficits Psych: appropriate affect   Diagnostic Studies  11/2010 Dobutamine Stress Echo Study Conclusions - baseline LVEF 55-60% - Stress ECG conclusions: There were no significant stress arrhythmias or conduction abnormalities; rare PVC. The stress ECG wasborderline positivefor ischemia-T waves became bi-phasic, and 1-1.5 mm of flat ST depression was noted in the inferior leads. - Staged echo: There was no echocardiographic evidence for stress-induced ischemia.   11/2014 echo Study Conclusions  - Left ventricle: The cavity size was normal. There was mild concentric hypertrophy. Systolic function was normal. The estimated ejection fraction was in the range of 60% to 65%. Wall motion was normal; there were no regional wall motion abnormalities.  Doppler parameters are consistent with abnormal left ventricular relaxation (grade 1 diastolic dysfunction). Doppler parameters are consistent with high ventricular filling pressure. - Aortic valve: Mildly calcified annulus. Trileaflet; mildly thickened leaflets. - Aorta: Aortic root upper normal limits in diameter. Aortic root dimension: 39 mm (ED). - Mitral valve: Mildly calcified annulus.     Assessment and Plan  1. HTN - at goal, we will continue current meds  2. Hyperlipidemia -did not tolerate stronger statins continue pravastatin. Request labs from pcp  3. Tobacco abuse - counseled on health risks of smoking and advised to quit.  4. OSA screen - signs and symptoms of OSA, he is not interested in sleep study  5. Leg swelling - continue prn lasix.  F/u 6 months  Arnoldo Lenis, M.D.

## 2015-06-28 NOTE — Patient Instructions (Signed)
Medication Instructions:  Your physician recommends that you continue on your current medications as directed. Please refer to the Current Medication list given to you today.   Labwork: WE WILL REQUEST YOUR LABS FROM YOUR PCP  Testing/Procedures: NONE  Follow-Up: Your physician wants you to follow-up in: 6 MONTHS.  You will receive a reminder letter in the mail two months in advance. If you don't receive a letter, please call our office to schedule the follow-up appointment.   Any Other Special Instructions Will Be Listed Below (If Applicable).     If you need a refill on your cardiac medications before your next appointment, please call your pharmacy.

## 2015-07-06 ENCOUNTER — Ambulatory Visit (INDEPENDENT_AMBULATORY_CARE_PROVIDER_SITE_OTHER): Payer: Medicare Other | Admitting: Nurse Practitioner

## 2015-07-06 ENCOUNTER — Encounter: Payer: Self-pay | Admitting: Nurse Practitioner

## 2015-07-06 ENCOUNTER — Other Ambulatory Visit: Payer: Self-pay

## 2015-07-06 VITALS — BP 141/85 | HR 74 | Temp 98.7°F | Ht 73.0 in | Wt 307.6 lb

## 2015-07-06 DIAGNOSIS — Z1211 Encounter for screening for malignant neoplasm of colon: Secondary | ICD-10-CM | POA: Insufficient documentation

## 2015-07-06 DIAGNOSIS — G4730 Sleep apnea, unspecified: Secondary | ICD-10-CM

## 2015-07-06 MED ORDER — PEG 3350-KCL-NA BICARB-NACL 420 G PO SOLR: 4000.0000 mL | ORAL | Status: DC

## 2015-07-06 NOTE — Patient Instructions (Signed)
1. We'll schedule your procedure for you. 2. Return for follow-up as needed or based on recommendations after your procedure.

## 2015-07-06 NOTE — Progress Notes (Signed)
Primary Care Physician:  Jani Gravel, MD Primary Gastroenterologist:  Dr. Oneida Alar  Chief Complaint  Patient presents with  . set up procedures    HPI:   Sean [name] is a 65 y.o. male who presents to schedule colonoscopy. Was attempted phone triage but deemed necessary for office visit due to BMI greater than 40, possible sleep apnea. No record of colonoscopy in our system.  Today he states he has never had a colonoscopy before. Denies abdominal pain, N/V. Has had a couple occasions of hematochezia about 2 months ago with a history of hemorrhoids. Blood was on the toilet tissue and on the stool. Denies melena. Denies fever, chills, unintentional weight loss, changes in bowel habits. Has sleep apnea, has never had sleep study or CPAP diagnosed. Denies chest pain, dyspnea, dizziness, lightheadedness, syncope, near syncope. Denies any other upper or lower GI symptoms.  Chromic pain medications since surgery and takes "at most" 2 times a week, typically less.  Past Medical History  Diagnosis Date  . HTN (hypertension)   . HLD (hyperlipidemia)   . Tobacco abuse     70 pack years  . Abnormal EKG     negative stress nuclear-2005; probable inferolateral scarring in 2007- negative EKG at 10 mets   . GERD (gastroesophageal reflux disease)   . 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 Obesity   . Shortness of breath     exertion  . Diabetes mellitus     AODM- no insulin- fasting cbg 100-150  . H/O hiatal hernia   . Hx of dizziness     Past Surgical History  Procedure Laterality Date  . Total hip arthroplasty  2005    Left  . Hernia repair  1997  . Lumbar spine surgery  2004    Laminectomy and fusion  . Cataract extraction  2011-12    Bilateral  . Joint replacement      bilateral hip     Current Outpatient Prescriptions  Medication Sig Dispense Refill  . ACCU-CHEK AVIVA PLUS test strip     . amLODipine-benazepril (LOTREL) 10-40 MG per capsule TAKE  (1) CAPSULE BY MOUTH ONCE DAILY. 90 capsule 3  . aspirin 81 MG tablet Take 81 mg by mouth daily.    . chlorthalidone (HYGROTON) 25 MG tablet Take 1 tablet by mouth  daily 90 tablet 0  . Cholecalciferol (VITAMIN D) 1000 UNITS capsule Take 1,000 Units by mouth daily.      Marland Kitchen doxazosin (CARDURA) 4 MG tablet Take 1 tablet by mouth  daily 90 tablet 0  . esomeprazole (NEXIUM) 40 MG capsule Take 40 mg by mouth daily.      . furosemide (LASIX) 40 MG tablet Take 40 mg daily as needed for leg swelling 90 tablet 3  . glipiZIDE (GLUCOTROL) 10 MG tablet Take 10 mg by mouth daily.      . magnesium oxide (MAG-OX) 400 MG tablet Take 400 mg by mouth daily.    . meloxicam (MOBIC) 15 MG tablet     . metFORMIN (GLUCOPHAGE) 500 MG tablet Take 500 mg by mouth 2 (two) times daily. Takes 2 tab in am    . metoprolol succinate (TOPROL-XL) 100 MG 24 hr tablet Take 1 tablet (100 mg total) by mouth daily. Take with or immediately following a meal. 30 tablet 11  . oxyCODONE-acetaminophen (PERCOCET) 10-325 MG per tablet every 8 (eight) hours as needed.     . pravastatin (PRAVACHOL) 80  MG tablet Take 1 tablet (80 mg total) by mouth every evening. 90 tablet 3  . sildenafil (VIAGRA) 50 MG tablet Take 50 mg by mouth daily as needed for erectile dysfunction.    . methocarbamol (ROBAXIN) 500 MG tablet Take 1 tablet (500 mg total) by mouth every 8 (eight) hours as needed (muscle spasms). (Patient not taking: Reported on 07/06/2015) 90 tablet 0   No current facility-administered medications for this visit.    Allergies as of 07/06/2015  . (No Known Allergies)    Family History  Problem Relation Age of Onset  . Stroke Mother     Social History   Social History  . Marital Status: Single    Spouse Name: N/A  . Number of Children: N/A  . Years of Education: N/A   Occupational History  . Not on file.   Social History Main Topics  . Smoking status: Current Every Day Smoker -- 2.00 packs/day for 50 years    Types:  Cigarettes  . Smokeless tobacco: Never Used     Comment: 70 pack years  . Alcohol Use: 0.0 oz/week    0 Standard drinks or equivalent per week     Comment: occasional  . Drug Use: No  . Sexual Activity: Not on file   Other Topics Concern  . Not on file   Social History Narrative   Employment- [employer]. Single, 2 children. Does not regularly exercises.     Review of Systems: 10-point ROS negative except as per HPI.    Physical Exam: BP 141/85 mmHg  Pulse 74  Temp(Src) 98.7 F (37.1 C)  Ht '6\' 1"'$  (1.854 m)  Wt 307 lb 9.6 oz (139.526 kg)  BMI 40.59 kg/m2 General:   Morbidly obese male, alert and oriented. Pleasant and cooperative. Well-nourished and well-developed.  Head:  Normocephalic and atraumatic. Eyes:  Without icterus, sclera clear and conjunctiva pink.  Ears:  Normal auditory acuity. Cardiovascular:  S1, S2 present without murmurs appreciated. Extremities without clubbing or edema. Respiratory:  Clear to auscultation bilaterally. No wheezes, rales, or rhonchi. No distress.  Gastrointestinal:  +BS, obese but soft, non-tender and non-distended. No HSM noted. No guarding or rebound. No masses appreciated.  Rectal:  Deferred  Musculoskalatal:  Symmetrical without gross deformities. Walks with a cane. Skin:  Intact without significant lesions or rashes. Neurologic:  Alert and oriented x4;  grossly normal neurologically. Psych:  Alert and cooperative. Normal mood and affect. Heme/Lymph/Immune: No excessive bruising noted.    07/06/2015 11:07 AM

## 2015-07-06 NOTE — Patient Instructions (Signed)
Sean [name]  07/06/2015     '@PREFPERIOPPHARMACY'$ @   Your procedure is scheduled on  07/11/2015  Report to [facility] at  1230 P.M.  Call this number if you have problems the morning of surgery:  [phone]   Remember:  Do not eat food or drink liquids after midnight.  Take these medicines the morning of surgery with A SIP OF WATER  Amlodipine, hygronton, cardura,mobic, metoprolol, oxycodone.   Do not wear jewelry, make-up or nail polish.  Do not wear lotions, powders, or perfumes.  You may wear deodorant.  Do not shave 48 hours prior to surgery.  Men may shave face and neck.  Do not bring valuables to the hospital.  [facility] is not responsible for any belongings or valuables.  Contacts, dentures or bridgework may not be worn into surgery.  Leave your suitcase in the car.  After surgery it may be brought to your room.  For patients admitted to the hospital, discharge time will be determined by your treatment team.  Patients discharged the day of surgery will not be allowed to drive home.   Name and phone number of your driver:   family Special instructions:  Follow the diet and prep instructions given to you by Dr Nona Dell office.  Please read over the following fact sheets that you were given. Coughing and Deep Breathing, Surgical Site Infection Prevention, Anesthesia Post-op Instructions and Care and Recovery After Surgery      Colonoscopy A colonoscopy is an exam to look at the entire large intestine (colon). This exam can help find problems such as tumors, polyps, inflammation, and areas of bleeding. The exam takes about 1 hour.  LET [facility] CARE PROVIDER KNOW ABOUT:   Any allergies you have.  All medicines you are taking, including vitamins, herbs, eye drops, creams, and over-the-counter medicines.  Previous problems you or members of your family have had with the use of anesthetics.  Any blood disorders you have.  Previous surgeries you have  had.  Medical conditions you have. RISKS AND COMPLICATIONS  Generally, this is a safe procedure. However, as with any procedure, complications can occur. Possible complications include:  Bleeding.  Tearing or rupture of the colon wall.  Reaction to medicines given during the exam.  Infection (rare). BEFORE THE PROCEDURE   Ask your health care provider about changing or stopping your regular medicines.  You may be prescribed an oral bowel prep. This involves drinking a large amount of medicated liquid, starting the day before your procedure. The liquid will cause you to have multiple loose stools until your stool is almost clear or light green. This cleans out your colon in preparation for the procedure.  Do not eat or drink anything else once you have started the bowel prep, unless your health care provider tells you it is safe to do so.  Arrange for someone to drive you home after the procedure. PROCEDURE   You will be given medicine to help you relax (sedative).  You will lie on your side with your knees bent.  A long, flexible tube with a light and camera on the end (colonoscope) will be inserted through the rectum and into the colon. The camera sends video back to a computer screen as it moves through the colon. The colonoscope also releases carbon dioxide gas to inflate the colon. This helps your health care provider see the area better.  During the exam, your health care  provider may take a small tissue sample (biopsy) to be examined under a microscope if any abnormalities are found.  The exam is finished when the entire colon has been viewed. AFTER THE PROCEDURE   Do not drive for 24 hours after the exam.  You may have a small amount of blood in your stool.  You may pass moderate amounts of gas and have mild abdominal cramping or bloating. This is caused by the gas used to inflate your colon during the exam.  Ask when your test results will be ready and how you will  get your results. Make sure you get your test results.   This information is not intended to replace advice given to you by your health care provider. Make sure you discuss any questions you have with your health care provider.   Document Released: 05/03/2000 Document Revised: 02/24/2013 Document Reviewed: 01/11/2013 Elsevier Interactive Patient Education 2016 [employer] Colonoscopy, Care After Refer to this sheet in the next few weeks. These instructions provide you with information on caring for yourself after your procedure. Your health care provider may also give you more specific instructions. Your treatment has been planned according to current medical practices, but problems sometimes occur. Call your health care provider if you have any problems or questions after your procedure. WHAT TO EXPECT AFTER THE PROCEDURE  After your procedure, it is typical to have the following:  A small amount of blood in your stool.  Moderate amounts of gas and mild abdominal cramping or bloating. HOME CARE INSTRUCTIONS  Do not drive, operate machinery, or sign important documents for 24 hours.  You may shower and resume your regular physical activities, but move at a slower pace for the first 24 hours.  Take frequent rest periods for the first 24 hours.  Walk around or put a warm pack on your abdomen to help reduce abdominal cramping and bloating.  Drink enough fluids to keep your urine clear or pale yellow.  You may resume your normal diet as instructed by your health care provider. Avoid heavy or fried foods that are hard to digest.  Avoid drinking alcohol for 24 hours or as instructed by your health care provider.  Only take over-the-counter or prescription medicines as directed by your health care provider.  If a tissue sample (biopsy) was taken during your procedure:  Do not take aspirin or blood thinners for 7 days, or as instructed by your health care provider.  Do not drink  alcohol for 7 days, or as instructed by your health care provider.  Eat soft foods for the first 24 hours. SEEK MEDICAL CARE IF: You have persistent spotting of blood in your stool 2-3 days after the procedure. SEEK IMMEDIATE MEDICAL CARE IF:  You have more than a small spotting of blood in your stool.  You pass large blood clots in your stool.  Your abdomen is swollen (distended).  You have nausea or vomiting.  You have a fever.  You have increasing abdominal pain that is not relieved with medicine.   This information is not intended to replace advice given to you by your health care provider. Make sure you discuss any questions you have with your health care provider.   Document Released: 12/19/2003 Document Revised: 02/24/2013 Document Reviewed: 01/11/2013 Elsevier Interactive Patient Education 2016 [employer] PATIENT INSTRUCTIONS POST-ANESTHESIA  IMMEDIATELY FOLLOWING SURGERY:  Do not drive or operate machinery for the first twenty four hours after surgery.  Do not make any important decisions for  twenty four hours after surgery or while taking narcotic pain medications or sedatives.  If you develop intractable nausea and vomiting or a severe headache please notify your doctor immediately.  FOLLOW-UP:  Please make an appointment with your surgeon as instructed. You do not need to follow up with anesthesia unless specifically instructed to do so.  WOUND CARE INSTRUCTIONS (if applicable):  Keep a dry clean dressing on the anesthesia/puncture wound site if there is drainage.  Once the wound has quit draining you may leave it open to air.  Generally you should leave the bandage intact for twenty four hours unless there is drainage.  If the epidural site drains for more than 36-48 hours please call the anesthesia department.  QUESTIONS?:  Please feel free to call your physician or the hospital operator if you have any questions, and they will be happy to assist you.

## 2015-07-07 ENCOUNTER — Encounter (HOSPITAL_COMMUNITY)
Admission: RE | Admit: 2015-07-07 | Discharge: 2015-07-07 | Disposition: A | Discharge status: Home / Self Care | Payer: Medicare Other | Source: Ambulatory Visit | Attending: Gastroenterology | Admitting: Gastroenterology

## 2015-07-07 ENCOUNTER — Encounter (HOSPITAL_COMMUNITY): Payer: Self-pay

## 2015-07-07 DIAGNOSIS — K219 Gastro-esophageal reflux disease without esophagitis: Secondary | ICD-10-CM | POA: Insufficient documentation

## 2015-07-07 DIAGNOSIS — F17210 Nicotine dependence, cigarettes, uncomplicated: Secondary | ICD-10-CM | POA: Insufficient documentation

## 2015-07-07 DIAGNOSIS — Z7984 Long term (current) use of oral hypoglycemic drugs: Secondary | ICD-10-CM | POA: Diagnosis not present

## 2015-07-07 DIAGNOSIS — K921 Melena: Secondary | ICD-10-CM | POA: Insufficient documentation

## 2015-07-07 DIAGNOSIS — G4730 Sleep apnea, unspecified: Secondary | ICD-10-CM | POA: Insufficient documentation

## 2015-07-07 DIAGNOSIS — E785 Hyperlipidemia, unspecified: Secondary | ICD-10-CM | POA: Diagnosis not present

## 2015-07-07 DIAGNOSIS — E669 Obesity, unspecified: Secondary | ICD-10-CM | POA: Insufficient documentation

## 2015-07-07 DIAGNOSIS — Z79899 Other long term (current) drug therapy: Secondary | ICD-10-CM | POA: Diagnosis not present

## 2015-07-07 DIAGNOSIS — I10 Essential (primary) hypertension: Secondary | ICD-10-CM | POA: Diagnosis not present

## 2015-07-07 DIAGNOSIS — Z6841 Body Mass Index (BMI) 40.0 and over, adult: Secondary | ICD-10-CM | POA: Diagnosis not present

## 2015-07-07 DIAGNOSIS — Z01812 Encounter for preprocedural laboratory examination: Secondary | ICD-10-CM | POA: Insufficient documentation

## 2015-07-07 DIAGNOSIS — Z9889 Other specified postprocedural states: Secondary | ICD-10-CM | POA: Diagnosis not present

## 2015-07-07 DIAGNOSIS — Z7982 Long term (current) use of aspirin: Secondary | ICD-10-CM | POA: Insufficient documentation

## 2015-07-07 DIAGNOSIS — E119 Type 2 diabetes mellitus without complications: Secondary | ICD-10-CM | POA: Insufficient documentation

## 2015-07-07 HISTORY — DX: Polyneuropathy, unspecified: G62.9

## 2015-07-07 LAB — BASIC METABOLIC PANEL
ANION GAP: 8 (ref 5–15)
BUN: 18 mg/dL (ref 6–20)
CALCIUM: 9 mg/dL (ref 8.9–10.3)
CO2: 32 mmol/L (ref 22–32)
Chloride: 104 mmol/L (ref 101–111)
Creatinine, Ser: 1.13 mg/dL (ref 0.61–1.24)
GFR calc Af Amer: >60 mL/min (ref >60)
GFR calc non Af Amer: >60 mL/min (ref >60)
GLUCOSE: 176 mg/dL — AB (ref 65–99)
Potassium: 3.4 mmol/L — ABNORMAL LOW (ref 3.5–5.1)
Sodium: 144 mmol/L (ref 135–145)

## 2015-07-07 LAB — CBC WITH DIFFERENTIAL/PLATELET
BASOS ABS: 0.1 10*3/uL (ref 0.0–0.1)
Basophils Relative: 1 %
Eosinophils Absolute: 0.2 10*3/uL (ref 0.0–0.7)
Eosinophils Relative: 2 %
HEMATOCRIT: 43.8 % (ref 39.0–52.0)
Hemoglobin: 14.1 g/dL (ref 13.0–17.0)
LYMPHS ABS: 2.6 10*3/uL (ref 0.7–4.0)
LYMPHS PCT: 24 %
MCH: 29.4 pg (ref 26.0–34.0)
MCHC: 32.2 g/dL (ref 30.0–36.0)
MCV: 91.3 fL (ref 78.0–100.0)
MONO ABS: 0.9 10*3/uL (ref 0.1–1.0)
MONOS PCT: 9 %
NEUTROS ABS: 6.9 10*3/uL (ref 1.7–7.7)
Neutrophils Relative %: 64 %
Platelets: 260 10*3/uL (ref 150–400)
RBC: 4.8 MIL/uL (ref 4.22–5.81)
RDW: 15 % (ref 11.5–15.5)
WBC: 10.7 10*3/uL — ABNORMAL HIGH (ref 4.0–10.5)

## 2015-07-07 NOTE — Assessment & Plan Note (Signed)
Patient overdue for first-ever screening colonoscopy, currently age 65. Has had a couple episodes of hematochezia and has a history of hemorrhoids. Otherwise, asymptomatic from a GI standpoint. At this point we'll move forward with needed colonoscopy.  Proceed with colonoscopy in the OR with propofol/MAC with Dr. Oneida Alar in the near future. The risks, benefits, and alternatives have been discussed in detail with the patient. They state understanding and desire to proceed.   The patient is on occasional chronic pain medications including Percocet. He is not on any anticoagulants, anxiolytics, chronic antidepressants. He does have a BMI of over 40 and undiagnosed but likely sleep apnea. For these reasons we'll plan for the procedure and the OR on propofol/MAC.

## 2015-07-07 NOTE — Progress Notes (Signed)
   07/07/15 1327  OBSTRUCTIVE SLEEP APNEA  Have you ever been diagnosed with sleep apnea through a sleep study? No  Do you snore loudly (loud enough to be heard through closed doors)?  0  Do you often feel tired, fatigued, or sleepy during the daytime (such as falling asleep during driving or talking to someone)? 1  Has anyone observed you stop breathing during your sleep? 1  Do you have, or are you being treated for high blood pressure? 1  BMI more than 35 kg/m2? 1  Age > 50 (1-yes) 1  Neck circumference greater than:Male 16 inches or larger, Male 17inches or larger? 1  Male Gender (Yes=1) 1  Obstructive Sleep Apnea Score 7  Score 5 or greater  Results sent to PCP

## 2015-07-07 NOTE — Pre-Procedure Instructions (Signed)
Patient given information to sign up for my chart at home. 

## 2015-07-08 ENCOUNTER — Telehealth: Payer: Self-pay | Admitting: Gastroenterology

## 2015-07-08 NOTE — Telephone Encounter (Addendum)
Called patient TO DISCUSS RESULTS. HIS K IS LOW BECAUSE HE TAKES CHLORTHALIDONE AND LASIX. HE SHOULD NOT TAKE BOTH FLUID PILLS TOGETHER.  HE SHOULD DRINK 1 CUP OF ORANGE JUICE OR EAT 1 BANANA DAILY FOR 3 DAYS. HE SHOULD HOLD LASIX AND CHLORTHALIDONE ON FEB 86 OR FEB 21. PT VOICED [facility]

## 2015-07-08 NOTE — Progress Notes (Signed)
REVIEWED-NO ADDITIONAL RECOMMENDATIONS. 

## 2015-07-10 NOTE — Progress Notes (Signed)
cc'ed to pcp °

## 2015-07-11 ENCOUNTER — Ambulatory Visit (HOSPITAL_COMMUNITY)
Admission: RE | Admit: 2015-07-11 | Discharge: 2015-07-11 | Disposition: A | Discharge status: Home / Self Care | Payer: Medicare Other | Source: Ambulatory Visit | Attending: Gastroenterology | Admitting: Gastroenterology

## 2015-07-11 ENCOUNTER — Ambulatory Visit (HOSPITAL_COMMUNITY): Payer: Medicare Other | Admitting: Anesthesiology

## 2015-07-11 ENCOUNTER — Encounter (HOSPITAL_COMMUNITY)
Admission: RE | Disposition: A | Discharge status: Home / Self Care | Payer: Self-pay | Source: Ambulatory Visit | Attending: Gastroenterology

## 2015-07-11 ENCOUNTER — Encounter (HOSPITAL_COMMUNITY): Payer: Self-pay | Admitting: *Deleted

## 2015-07-11 DIAGNOSIS — E119 Type 2 diabetes mellitus without complications: Secondary | ICD-10-CM | POA: Insufficient documentation

## 2015-07-11 DIAGNOSIS — F17210 Nicotine dependence, cigarettes, uncomplicated: Secondary | ICD-10-CM | POA: Diagnosis not present

## 2015-07-11 DIAGNOSIS — Z96642 Presence of left artificial hip joint: Secondary | ICD-10-CM | POA: Diagnosis not present

## 2015-07-11 DIAGNOSIS — D128 Benign neoplasm of rectum: Secondary | ICD-10-CM | POA: Insufficient documentation

## 2015-07-11 DIAGNOSIS — D123 Benign neoplasm of transverse colon: Secondary | ICD-10-CM

## 2015-07-11 DIAGNOSIS — E1140 Type 2 diabetes mellitus with diabetic neuropathy, unspecified: Secondary | ICD-10-CM | POA: Diagnosis not present

## 2015-07-11 DIAGNOSIS — Z79899 Other long term (current) drug therapy: Secondary | ICD-10-CM | POA: Insufficient documentation

## 2015-07-11 DIAGNOSIS — Z7984 Long term (current) use of oral hypoglycemic drugs: Secondary | ICD-10-CM | POA: Insufficient documentation

## 2015-07-11 DIAGNOSIS — F17200 Nicotine dependence, unspecified, uncomplicated: Secondary | ICD-10-CM | POA: Diagnosis not present

## 2015-07-11 DIAGNOSIS — E785 Hyperlipidemia, unspecified: Secondary | ICD-10-CM | POA: Insufficient documentation

## 2015-07-11 DIAGNOSIS — D120 Benign neoplasm of cecum: Secondary | ICD-10-CM | POA: Diagnosis not present

## 2015-07-11 DIAGNOSIS — Z1211 Encounter for screening for malignant neoplasm of colon: Secondary | ICD-10-CM

## 2015-07-11 DIAGNOSIS — K219 Gastro-esophageal reflux disease without esophagitis: Secondary | ICD-10-CM | POA: Diagnosis not present

## 2015-07-11 DIAGNOSIS — E669 Obesity, unspecified: Secondary | ICD-10-CM | POA: Insufficient documentation

## 2015-07-11 DIAGNOSIS — I10 Essential (primary) hypertension: Secondary | ICD-10-CM | POA: Diagnosis not present

## 2015-07-11 DIAGNOSIS — Z7982 Long term (current) use of aspirin: Secondary | ICD-10-CM | POA: Diagnosis not present

## 2015-07-11 HISTORY — PX: COLONOSCOPY WITH PROPOFOL: SHX5780

## 2015-07-11 HISTORY — PX: BIOPSY: SHX5522

## 2015-07-11 HISTORY — PX: POLYPECTOMY: SHX5525

## 2015-07-11 LAB — GLUCOSE, CAPILLARY
Glucose-Capillary: 102 mg/dL — ABNORMAL HIGH (ref 65–99)
Glucose-Capillary: 116 mg/dL — ABNORMAL HIGH (ref 65–99)

## 2015-07-11 SURGERY — COLONOSCOPY WITH PROPOFOL
Anesthesia: Monitor Anesthesia Care

## 2015-07-11 MED ORDER — PROPOFOL 10 MG/ML IV BOLUS
INTRAVENOUS | Status: AC
  Filled 2015-07-11: qty 40

## 2015-07-11 MED ORDER — SPOT INK MARKER SYRINGE KIT
PACK | SUBMUCOSAL | Status: DC | PRN
  Administered 2015-07-11: 5 mL via SUBMUCOSAL

## 2015-07-11 MED ORDER — MIDAZOLAM HCL 2 MG/2ML IJ SOLN
INTRAMUSCULAR | Status: AC
  Filled 2015-07-11: qty 2

## 2015-07-11 MED ORDER — FENTANYL CITRATE (PF) 100 MCG/2ML IJ SOLN
25.0000 ug | INTRAMUSCULAR | Status: AC
  Administered 2015-07-11 (×2): 25 ug via INTRAVENOUS

## 2015-07-11 MED ORDER — PROPOFOL 500 MG/50ML IV EMUL
INTRAVENOUS | Status: DC | PRN
  Administered 2015-07-11: 50 ug/kg/min via INTRAVENOUS
  Administered 2015-07-11: 45 ug/kg/min via INTRAVENOUS
  Administered 2015-07-11: 125 ug/kg/min via INTRAVENOUS

## 2015-07-11 MED ORDER — MIDAZOLAM HCL 5 MG/5ML IJ SOLN
INTRAMUSCULAR | Status: DC | PRN
  Administered 2015-07-11: 1 mg via INTRAVENOUS

## 2015-07-11 MED ORDER — FENTANYL CITRATE (PF) 100 MCG/2ML IJ SOLN
INTRAMUSCULAR | Status: AC
  Filled 2015-07-11: qty 2

## 2015-07-11 MED ORDER — PROPOFOL 10 MG/ML IV BOLUS
INTRAVENOUS | Status: AC
  Filled 2015-07-11: qty 20

## 2015-07-11 MED ORDER — EPINEPHRINE HCL 0.1 MG/ML IJ SOSY
PREFILLED_SYRINGE | INTRAMUSCULAR | Status: AC
  Filled 2015-07-11: qty 10

## 2015-07-11 MED ORDER — MIDAZOLAM HCL 2 MG/2ML IJ SOLN
1.0000 mg | INTRAMUSCULAR | Status: DC | PRN
  Administered 2015-07-11: 2 mg via INTRAVENOUS

## 2015-07-11 MED ORDER — LACTATED RINGERS IV SOLN
INTRAVENOUS | Status: DC
  Administered 2015-07-11 (×2): via INTRAVENOUS

## 2015-07-11 MED ORDER — ONDANSETRON HCL 4 MG/2ML IJ SOLN: 4.0000 mg | Freq: Once | INTRAMUSCULAR | Status: DC | PRN

## 2015-07-11 MED ORDER — FENTANYL CITRATE (PF) 100 MCG/2ML IJ SOLN: 25.0000 ug | INTRAMUSCULAR | Status: DC | PRN

## 2015-07-11 NOTE — H&P (Signed)
Primary Care Physician:  Jani Gravel, MD Primary Gastroenterologist:  Dr. Oneida Alar  Pre-Procedure History & Physical: HPI:  Sean [name] is a 65 y.o. male here for COLON CANCER SCREENING.  Past Medical History  Diagnosis Date  . HTN (hypertension)   . HLD (hyperlipidemia)   . Tobacco abuse     70 pack years  . Abnormal EKG     negative stress nuclear-2005; probable inferolateral scarring in 2007- negative EKG at 10 mets   . GERD (gastroesophageal reflux disease)   . 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 Obesity   . Shortness of breath     exertion  . Diabetes mellitus     AODM- no insulin- fasting cbg 100-150  . H/O hiatal hernia   . Hx of dizziness   . Neuropathy [facility])     Past Surgical History  Procedure Laterality Date  . Total hip arthroplasty  2005    Left  . Lumbar spine surgery  2004    Laminectomy and fusion  . Cataract extraction  2011-12    Bilateral  . Joint replacement      bilateral hip   . Hernia repair Bilateral 1997    Prior to Admission medications   Medication Sig Start Date End Date Taking? Authorizing Provider  ACCU-CHEK AVIVA PLUS test strip  12/17/10  Yes Historical Provider, MD  amLODipine-benazepril (LOTREL) 10-40 MG per capsule TAKE (1) CAPSULE BY MOUTH ONCE DAILY. 01/10/15  Yes Arnoldo Lenis, MD  aspirin 81 MG tablet Take 81 mg by mouth daily.   Yes Historical Provider, MD  chlorthalidone (HYGROTON) 25 MG tablet Take 1 tablet by mouth  daily 05/18/15  Yes Arnoldo Lenis, MD  Cholecalciferol (VITAMIN D) 1000 UNITS capsule Take 1,000 Units by mouth daily.     Yes Historical Provider, MD  doxazosin (CARDURA) 4 MG tablet Take 1 tablet by mouth  daily 05/18/15  Yes Arnoldo Lenis, MD  esomeprazole (NEXIUM) 40 MG capsule Take 40 mg by mouth daily.     Yes Historical Provider, MD  furosemide (LASIX) 40 MG tablet Take 40 mg daily as needed for leg swelling 02/09/15  Yes Arnoldo Lenis, MD  glipiZIDE (GLUCOTROL)  10 MG tablet Take 10 mg by mouth daily.     Yes Historical Provider, MD  magnesium oxide (MAG-OX) 400 MG tablet Take 400 mg by mouth daily.   Yes Historical Provider, MD  meloxicam (MOBIC) 15 MG tablet Take 15 mg by mouth daily.  07/30/12  Yes Historical Provider, MD  metFORMIN (GLUCOPHAGE) 500 MG tablet Take 500-1,000 mg by mouth 2 (two) times daily. Takes 2 tablets in the morning and 1 tablet in the evening.   Yes Historical Provider, MD  metoprolol succinate (TOPROL-XL) 100 MG 24 hr tablet Take 1 tablet (100 mg total) by mouth daily. Take with or immediately following a meal. 01/11/15  Yes Arnoldo Lenis, MD  oxyCODONE-acetaminophen (PERCOCET) 10-325 MG per tablet Take 1 tablet by mouth every 8 (eight) hours as needed for pain.  09/02/12  Yes Historical Provider, MD  pravastatin (PRAVACHOL) 80 MG tablet Take 1 tablet (80 mg total) by mouth every evening. 01/10/15  Yes Arnoldo Lenis, MD  sildenafil (VIAGRA) 50 MG tablet Take 50 mg by mouth daily as needed for erectile dysfunction.   Yes Historical Provider, MD  polyethylene glycol-electrolytes (TRILYTE) 420 g solution Take 4,000 mLs by mouth as directed. 07/06/15   Danie Binder, MD  Allergies as of 07/06/2015  . (No Known Allergies)    Family History  Problem Relation Age of Onset  . Stroke Mother   . Colon cancer Neg Hx     Social History   Social History  . Marital Status: Single    Spouse Name: N/A  . Number of Children: N/A  . Years of Education: N/A   Occupational History  . Not on file.   Social History Main Topics  . Smoking status: Current Every Day Smoker -- 2.00 packs/day for 50 years    Types: Cigarettes  . Smokeless tobacco: Never Used     Comment: 70 pack years  . Alcohol Use: 0.0 oz/week    0 Standard drinks or equivalent per week     Comment: occasional; one drink about 2 times a month.  . Drug Use: No  . Sexual Activity: Yes    Birth Control/ Protection: None   Other Topics Concern  . Not on file    Social History Narrative   Employment- [employer]. Single, 2 children. Does not regularly exercises.     Review of Systems: See HPI, otherwise negative ROS   Physical Exam: Temp(Src) 99.2 F (37.3 C) (Oral) General:   Alert,  pleasant and cooperative in NAD Head:  Normocephalic and atraumatic. Neck:  Supple; Lungs:  Clear throughout to auscultation.    Heart:  Regular rate and rhythm. Abdomen:  Soft, nontender and nondistended. Normal bowel sounds, without guarding, and without rebound.   Neurologic:  Alert and  oriented x4;  grossly normal neurologically.  Impression/Plan:   SCREENING  Plan:  1. TCS TODAY

## 2015-07-11 NOTE — Op Note (Addendum)
[facility] [address] [place], [address]   COLONOSCOPY PROCEDURE REPORT  PATIENT: Simpson, Paulos  MR#: [id number] BIRTHDATE: Jul 16, 1950 , 64  yrs. old GENDER: male ENDOSCOPIST: Danie Binder, MD REFERRED PX:TGGYI Kim, M.D. PROCEDURE DATE:  08/06/15 PROCEDURE:Colonoscopy with cold/snare polypectomy, & endoscopic mucosal resection/Submucosal injection: EPI/SPOT INDICATIONS:average risk patient for colon cancer. MEDICATIONS: Monitored anesthesia care  DESCRIPTION OF PROCEDURE:    Physical exam was performed.  Informed consent was obtained from the patient after explaining the benefits, risks, and alternatives to procedure.  The patient was connected to monitor and placed in left lateral position. Continuous oxygen was provided by nasal cannula and IV medicine administered through an indwelling cannula.  After administration of sedation and rectal exam, the patients rectum was intubated and the EC-3890Li ([id number])  colonoscope was advanced under direct visualization to the cecum.  The scope was removed slowly by carefully examining the color, texture, anatomy, and integrity mucosa on the way out.  The patient was recovered in endoscopy and discharged home in satisfactory condition. Estimated blood loss is zero unless otherwise noted in this procedure report.     COLON FINDINGS: Two 8-10 mm flat polyps removed via endoscopic mucosal resection.  each polyp ligfted with 3 cc saline.  1 cc epinephrine injected under larger polyp in cecum.  APC(20 w, 0.8L/MIN) USED TO CAUTERIZE EDGES OF CECAL POLYPS.  ONE LARGE HEPATIC FLEXURE POLYP LIFTED WITH 2 CC SALINE/1 CC SPOT.  EDGE CAUTERIZED WITH APC(0.5L/MIN, 25 W).  2 CLIPS APPLIED TO CLOSE BASE, A sessile polyp measuring 8 mm in size was found in the HEPATIC FLEXURE colon.  A polypectomy was performed using snare cautery.  A tattoo was applied. ONE CLIP placeD.  A depressed polyp ranging from 10 to 14m in size  was found in the proximal transverse colon.  A biopsy was performed using cold forceps. BLEEDING AFTER BIOPSY.  APC USED (0.8L/MIN, 25-30W) to control bleeding.  Hemostasis acheived with one clip placed, and A pedunculated polyp measuring 12 mm in size was found in the sigmoid colon.  A polypectomy was performed using snare cautery.  The resection was complete, the polyp tissue was completely retrieved and sent to histology.  A tattoo was applied.  Injection (tattooing) was performed.  The wound at the site was closed by placing hemoclips.  One (1) placement was made.  THREE FLAT PPOLYPS WITH CENTRAL DEPRESSION REMAIN: LARGE PROXIMAL TRANSVERSE COLON, ONE MID TRANSVERSE COLON, AND ONE SPLENIC FLEXURE POLYP. NOT REMOVED DUE TO AT RISK FOR PERFORATION & BLEEDING. ALL THREE SITES MARKED WITH SPOT.  LARGE EXTERNAL AND INTERNAL HEMORRHOIDS. NO DIVERTICULOSIS.  PREP QUALITY: excellent. CECAL W/D TIME: 59       minutes  COMPLICATIONS: None  ENDOSCOPIC IMPRESSION: 1.   FIVE POLYPS REMOVED 2.  THREE PROBABLE SERRATED ADENOMAS REMAIN 3.   LARGE INTERNAL AND EXTERNAL HEMORRHOIDS   RECOMMENDATIONS: NO ASPIRIN, BC/GOODY POWDERS, IBUPROFEN/MOTRIN, OR NAPROXEN/ALEVE FOR 7 DAYS, MOBIC IS OKAY. FOLLOW A HIGH FIBER DIET. AWAIT BIOPSY RESULTS. NEEDS EMR AT [facility] WITHIN THE NEXT 1-2 MOS. Next colonoscopy in 1-5 years.  ALL SISTERS, BROTHERS, CHILDREN, AND PARENTS NEED TO HAVE A COLONOSCOPY STARTING AT THE AGE OF 40.      _______________________________ eSigned:Danie Binder MD 003/19/173:50 PM    CPT CODES: ICD CODES:  The ICD and CPT codes recommended by this software are interpretations from the data that the clinical staff has captured with the software.  The verification of the  translation of this report to the ICD and CPT codes and modifiers is the sole responsibility of the health care institution and practicing physician where this report was generated.  [employer] will not be held responsible for the validity of the ICD and CPT codes included on this report.  AMA assumes no liability for data contained or not contained herein. CPT is a Designer, television/film set of the [employer].  PATIENT NAME:  Aqil, Goetting MR#: [id number]

## 2015-07-11 NOTE — Anesthesia Preprocedure Evaluation (Signed)
Anesthesia Evaluation  Patient identified by MRN, date of birth, ID band Patient awake    Reviewed: Allergy & Precautions, NPO status , Patient's Chart, lab work & pertinent test results  Airway Mallampati: II  TM Distance: >3 FB     Dental  (+) Teeth Intact   Pulmonary shortness of breath, Current Smoker,    breath sounds clear to auscultation       Cardiovascular hypertension, Pt. on medications  Rhythm:Regular Rate:Normal     Neuro/Psych    GI/Hepatic hiatal hernia, GERD  ,  Endo/Other  diabetes, Type 2Morbid obesity  Renal/GU      Musculoskeletal   Abdominal   Peds  Hematology   Anesthesia Other Findings   Reproductive/Obstetrics                             Anesthesia Physical Anesthesia Plan  ASA: III  Anesthesia Plan: MAC   Post-op Pain Management:    Induction:   Airway Management Planned: Simple Face Mask  Additional Equipment:   Intra-op Plan:   Post-operative Plan:   Informed Consent: I have reviewed the patients History and Physical, chart, labs and discussed the procedure including the risks, benefits and alternatives for the proposed anesthesia with the patient or authorized representative who has indicated his/her understanding and acceptance.     Plan Discussed with:   Anesthesia Plan Comments:         Anesthesia Quick Evaluation

## 2015-07-11 NOTE — Transfer of Care (Signed)
Immediate Anesthesia Transfer of Care Note  Patient: Sean [name]  Procedure(s) Performed: Procedure(s) with comments: COLONOSCOPY WITH PROPOFOL (N/A) - 200 - per office, pt can't come earlier POLYPECTOMY biopsy multiple - ceacl polyp APC used BIOPSY  Patient Location: PACU  Anesthesia Type:MAC  Level of Consciousness: awake and patient cooperative  Airway & Oxygen Therapy: Patient Spontanous Breathing and non-rebreather face mask  Post-op Assessment: Report given to RN, Post -op Vital signs reviewed and stable and Patient moving all extremities  Post vital signs: Reviewed and stable  Last Vitals:  Filed Vitals:   07/11/15 1315 07/11/15 1445  BP: 138/88   Temp:  36.7 C  Resp: 39     Complications: No apparent anesthesia complications

## 2015-07-11 NOTE — Anesthesia Postprocedure Evaluation (Signed)
Anesthesia Post Note  Patient: Sean [name]  Procedure(s) Performed: Procedure(s) (LRB): COLONOSCOPY WITH PROPOFOL (N/A) POLYPECTOMY biopsy multiple BIOPSY  Patient location during evaluation: PACU Anesthesia Type: MAC Level of consciousness: awake and alert Pain management: pain level controlled Vital Signs Assessment: post-procedure vital signs reviewed and stable Respiratory status: spontaneous breathing Cardiovascular status: stable Anesthetic complications: no    Last Vitals:  Filed Vitals:   07/11/15 1315 07/11/15 1445  BP: 138/88 137/79  Pulse:  72  Temp:  36.7 C  Resp: 39 11    Last Pain: There were no vitals filed for this visit.               Drucie Opitz

## 2015-07-11 NOTE — Discharge Instructions (Signed)
You had 5 polyps removed. YOU HAVE 3 POLYPS REMAINING IN YOUR COLON.  I MARKED THE THREE POLYP THAT REMAIN. THEY NEED TO BE REMOVED. I PLACE 5 METAL CLIPS IN YOUR COLON TO PREVENT BLEEDING IN 7-10 DAYS. You have LARGE internal hemorrhoids.   NO ASPIRIN, BC/GOODY POWDERS, IBUPROFEN/MOTRIN, OR NAPROXEN/ALEVE FOR 7 DAYS, MOBIC IS OKAY.  FOLLOW A HIGH FIBER DIET. AVOID ITEMS THAT CAUSE BLOATING & GAS. SEE INFO BELOW.  YOUR BIOPSY RESULTS WILL BE AVAILABLE IN MY CHART FEB 24 AND MY OFFICE WILL CONTACT YOU IN 10-14 DAYS WITH YOUR RESULTS.   YOU WILL NEED TO GO TO [facility] AND HAVE THE THREE REMAINING POLYPS REMOVED WITHIN THE NEXT 1-2 MOS.   Next colonoscopy in 1-5 years. YOUR SISTERS, BROTHERS, CHILDREN, AND PARENTS NEED TO HAVE A COLONOSCOPY STARTING AT THE AGE OF 40.   Colonoscopy Care After Read the instructions outlined below and refer to this sheet in the next week. These discharge instructions provide you with general information on caring for yourself after you leave the hospital. While your treatment has been planned according to the most current medical practices available, unavoidable complications occasionally occur. If you have any problems or questions after discharge, call DR. Evolette Pendell, [phone].  ACTIVITY  You may resume your regular activity, but move at a slower pace for the next 24 hours.   Take frequent rest periods for the next 24 hours.   Walking will help get rid of the air and reduce the bloated feeling in your belly (abdomen).   No driving for 24 hours (because of the medicine (anesthesia) used during the test).   You may shower.   Do not sign any important legal documents or operate any machinery for 24 hours (because of the anesthesia used during the test).    NUTRITION  Drink plenty of fluids.   You may resume your normal diet as instructed by your doctor.   Begin with a light meal and progress to your normal diet. Heavy or fried foods are harder to  digest and may make you feel sick to your stomach (nauseated).   Avoid alcoholic beverages for 24 hours or as instructed.    MEDICATIONS  You may resume your normal medications.   WHAT YOU CAN EXPECT TODAY  Some feelings of bloating in the abdomen.   Passage of more gas than usual.   Spotting of blood in your stool or on the toilet paper  .  IF YOU HAD POLYPS REMOVED DURING THE COLONOSCOPY:  Eat a soft diet IF YOU HAVE NAUSEA, BLOATING, ABDOMINAL PAIN, OR VOMITING.    FINDING OUT THE RESULTS OF YOUR TEST Not all test results are available during your visit. DR. Oneida Alar WILL CALL YOU WITHIN 14 DAYS OF YOUR PROCEDUE WITH YOUR RESULTS. Do not assume everything is normal if you have not heard from DR. Amillion Scobee, CALL HER OFFICE AT [phone].  SEEK IMMEDIATE MEDICAL ATTENTION AND CALL THE OFFICE: [phone] IF:  You have more than a spotting of blood in your stool.   Your belly is swollen (abdominal distention).   You are nauseated or vomiting.   You have a temperature over 101F.   You have abdominal pain or discomfort that is severe or gets worse throughout the day.   High-Fiber Diet A high-fiber diet changes your normal diet to include more whole grains, legumes, fruits, and vegetables. Changes in the diet involve replacing refined carbohydrates with unrefined foods. The calorie level of the diet is essentially unchanged. The  Dietary Reference Intake (recommended amount) for adult males is 38 grams per day. For adult females, it is 25 grams per day. Pregnant and lactating women should consume 28 grams of fiber per day. Fiber is the intact part of a plant that is not broken down during digestion. Functional fiber is fiber that has been isolated from the plant to provide a beneficial effect in the body. PURPOSE  Increase stool bulk.   Ease and regulate bowel movements.   Lower cholesterol.  REDUCE RISK OF COLON CANCER  INDICATIONS THAT YOU NEED MORE  FIBER  Constipation and hemorrhoids.   Uncomplicated diverticulosis (intestine condition) and irritable bowel syndrome.   Weight management.   As a protective measure against hardening of the arteries (atherosclerosis), diabetes, and cancer.   GUIDELINES FOR INCREASING FIBER IN THE DIET  Start adding fiber to the diet slowly. A gradual increase of about 5 more grams (2 slices of whole-wheat bread, 2 servings of most fruits or vegetables, or 1 bowl of high-fiber cereal) per day is best. Too rapid an increase in fiber may result in constipation, flatulence, and bloating.   Drink enough water and fluids to keep your urine clear or pale yellow. Water, juice, or caffeine-free drinks are recommended. Not drinking enough fluid may cause constipation.   Eat a variety of high-fiber foods rather than one type of fiber.   Try to increase your intake of fiber through using high-fiber foods rather than fiber pills or supplements that contain small amounts of fiber.   The goal is to change the types of food eaten. Do not supplement your present diet with high-fiber foods, but replace foods in your present diet.   INCLUDE A VARIETY OF FIBER SOURCES  Replace refined and processed grains with whole grains, canned fruits with fresh fruits, and incorporate other fiber sources. White Rossitto, white breads, and most bakery goods contain little or no fiber.   Brown whole-grain [place], buckwheat oats, and many fruits and vegetables are all good sources of fiber. These include: broccoli, Brussels sprouts, cabbage, cauliflower, beets, sweet potatoes, white potatoes (skin on), carrots, tomatoes, eggplant, squash, berries, fresh fruits, and dried fruits.   Cereals appear to be the richest source of fiber. Cereal fiber is found in whole grains and bran. Bran is the fiber-rich outer coat of cereal grain, which is largely removed in refining. In whole-grain cereals, the bran remains. In breakfast cereals, the largest  amount of fiber is found in those with "bran" in their names. The fiber content is sometimes indicated on the label.   You may need to include additional fruits and vegetables each day.   In baking, for 1 cup white flour, you may use the following substitutions:   1 cup whole-wheat flour minus 2 tablespoons.   1/2 cup white flour plus 1/2 cup whole-wheat flour.   Polyps, Colon  A polyp is extra tissue that grows inside your body. Colon polyps grow in the large intestine. The large intestine, also called the colon, is part of your digestive system. It is a long, hollow tube at the end of your digestive tract where your body makes and stores stool. Most polyps are not dangerous. They are benign. This means they are not cancerous. But over time, some types of polyps can turn into cancer. Polyps that are smaller than a pea are usually not harmful. But larger polyps could someday become or may already be cancerous. To be safe, doctors remove all polyps and test them.   PREVENTION  There is not one sure way to prevent polyps. You might be able to lower your risk of getting them if you:  Eat more fruits and vegetables and less fatty food.   Do not smoke.   Avoid alcohol.   Exercise every day.   Lose weight if you are overweight.   Eating more calcium and folate can also lower your risk of getting polyps. Some foods that are rich in calcium are milk, cheese, and broccoli. Some foods that are rich in folate are chickpeas, kidney beans, and spinach.   Hemorrhoids Hemorrhoids are dilated (enlarged) veins around the rectum. Sometimes clots will form in the veins. This makes them swollen and painful. These are called thrombosed hemorrhoids. Causes of hemorrhoids include:  Constipation.   Straining to have a bowel movement.   HEAVY LIFTING  HOME CARE INSTRUCTIONS  Eat a well balanced diet and drink 6 to 8 glasses of water every day to avoid constipation. You may also use a bulk laxative.    Avoid straining to have bowel movements.   Keep anal area dry and clean.   Do not use a donut shaped pillow or sit on the toilet for long periods. This increases blood pooling and pain.   Move your bowels when your body has the urge; this will require less straining and will decrease pain and pressure.

## 2015-07-12 ENCOUNTER — Telehealth: Payer: Self-pay

## 2015-07-12 NOTE — Telephone Encounter (Signed)
Per [facility] online TCS is approved. PA # is [id number]

## 2015-07-14 ENCOUNTER — Encounter (HOSPITAL_COMMUNITY): Payer: Self-pay | Admitting: Gastroenterology

## 2015-07-17 ENCOUNTER — Telehealth (HOSPITAL_COMMUNITY): Payer: Self-pay | Admitting: Anesthesiology

## 2015-07-25 ENCOUNTER — Telehealth: Payer: Self-pay | Admitting: Gastroenterology

## 2015-07-25 NOTE — Telephone Encounter (Signed)
Please call pt. HE had FIVE SIMPLE adenomas removed, BUT 3 REMAIN.  FOLLOW A HIGH FIBER DIET. AVOID ITEMS THAT CAUSE BLOATING & GAS.   YOU NEED TO GO TO [facility] AND HAVE THE THREE REMAINING POLYPS REMOVED WITHIN THE NEXT 1-2 MOS(LARGE FLAT ADENOMAS, NEEDS ENDOSCOPIC MUCOSAL RESECTION TO REMOVE).   FOLLOW UP IN 6 MOS E30 SIMPLE ADENOMAS.    Next colonoscopy AT [facility] DEPENDS ON FINDINGS AT [facility]. MOST LIKELY HE WILL NEED ONE AT [facility] 1 YEAR AFTER HIS TCS AT [facility]. ALL HIS SISTERS, BROTHERS, CHILDREN, AND PARENTS NEED TO HAVE A COLONOSCOPY STARTING AT THE AGE OF 40.

## 2015-07-26 ENCOUNTER — Other Ambulatory Visit: Payer: Self-pay

## 2015-07-26 ENCOUNTER — Encounter: Payer: Self-pay | Admitting: Gastroenterology

## 2015-07-26 DIAGNOSIS — Z9889 Other specified postprocedural states: Secondary | ICD-10-CM

## 2015-07-26 DIAGNOSIS — Z86010 Personal history of colonic polyps: Secondary | ICD-10-CM

## 2015-07-26 NOTE — Telephone Encounter (Signed)
Reminders in epic °

## 2015-07-26 NOTE — Telephone Encounter (Signed)
Referral sent to [facility]

## 2015-07-26 NOTE — Telephone Encounter (Signed)
Pt is aware and OK to refer to [facility].

## 2015-07-26 NOTE — Telephone Encounter (Signed)
APPT MADE AND LETTER SENT  °

## 2015-07-27 ENCOUNTER — Other Ambulatory Visit: Payer: Self-pay | Admitting: Cardiology

## 2015-10-01 ENCOUNTER — Other Ambulatory Visit: Payer: Self-pay | Admitting: Cardiology

## 2016-01-26 ENCOUNTER — Ambulatory Visit (INDEPENDENT_AMBULATORY_CARE_PROVIDER_SITE_OTHER): Payer: Medicare Other | Admitting: Nurse Practitioner

## 2016-01-26 ENCOUNTER — Encounter: Payer: Self-pay | Admitting: Nurse Practitioner

## 2016-01-26 VITALS — BP 135/72 | HR 83 | Temp 98.3°F | Ht 73.0 in | Wt 294.8 lb

## 2016-01-26 DIAGNOSIS — R197 Diarrhea, unspecified: Secondary | ICD-10-CM | POA: Diagnosis not present

## 2016-01-26 DIAGNOSIS — Z86010 Personal history of colonic polyps: Secondary | ICD-10-CM | POA: Insufficient documentation

## 2016-01-26 DIAGNOSIS — R112 Nausea with vomiting, unspecified: Secondary | ICD-10-CM | POA: Diagnosis not present

## 2016-01-26 NOTE — Assessment & Plan Note (Addendum)
5 polyps removed at [facility], 3 polyps removed at follow-up colonoscopy at [facility]. The 3 at [facility] for found to be tubular adenoma. Recommend 1 year repeat colonoscopy (March 2018) based on previous recommendation. No combination from procedures noted, no symptoms other than diarrhea and nausea/vomiting as described above. Return for follow-up in 6 months to schedule next colonoscopy.

## 2016-01-26 NOTE — Assessment & Plan Note (Signed)
Patient with diarrhea which started when he started taking cod liver oil supplement. He stopped taking this 1 week ago and symptoms being to be improving. Notable weight loss of about 13 pounds in the past 7 months, likely due to fluid losses. Possible gastroenteritis, infectious etiology, pancreatic insufficiency. Stool studies including C. difficile, GI pathogen panel, pancreatic fecal elastase. I will start him on a probiotic for 30 days. Samples of the probiotic were given. If stool studies are negative can give as needed Bentyl versus Lomotil for diarrhea. Return for follow-up in 6 months or sooner based on symptoms. ER precautions given.

## 2016-01-26 NOTE — Assessment & Plan Note (Signed)
Nausea and vomiting associated with diarrhea. No abdominal pain or other red flag/warning signs or symptoms. Symptoms have been ongoing for about a month and he thought likely due to cod liver oil. Stop taking the supplement one week ago. Symptoms seem to be improving. Possible gastroenteritis, adverse drug effect, exocrine pancreatic insufficiency, infectious etiology. I will send off stool studies including C. difficile, GI pathogen panel, pancreatic fecal elastase. We'll have him start a probiotic for 30 days. If stool studies are negative we can recommend Bentyl versus Lomotil for diarrhea. Return for follow-up in 6 months to schedule next colonoscopy, or as needed based on symptoms. ER precautions given.

## 2016-01-26 NOTE — Patient Instructions (Signed)
1. When you complete her stool studies, bring them back to the lab (not her office). 2. Take a probiotic for 30 days. I will provide samples and coupons as we have available. 3. If your stool studies are negative we can recommend medicine to help with diarrhea if it is persistent. 4. Return for follow-up in 6 months. 5. Call us if you have any worsening symptoms or problems. If you have severe abdominal pain, nausea and vomiting unable to keep food down, or other worsening severe signs proceed to the emergency room.

## 2016-01-26 NOTE — Progress Notes (Signed)
Referring Provider: Jani Gravel, MD Primary Care Physician:  Jani Gravel, MD Primary GI:  Dr. Oneida Alar  Chief Complaint  Patient presents with  . Follow-up    HPI:   Sean [name] is a 65 y.o. male who presents for follow-up on colonoscopy with adenomatous polyps. Colonoscopy completed 07/11/15 and found two 8-10 mm flat polyps removed with the aid of saline, epinephrine injection, and APC to cauterize: 1 large hepatic flexure polyp removed with saline lift and APC cauterization of the edges status post 2 clips applied, sessile polyp measuring 8 mm in the hepatic flexure removed with tattoo applied and 1 clip placed, depressed polyp between 10-15 mm in size in the proximal transverse colon status post biopsy and APC for bleeding control with hemostasis achieved with 1 clip placement, pedunculated polyp measuring 12 mm in size in the sigmoid colon status post polypectomy and tattoo applied. Total 5 polyps removed, 3 probable serrated adenomas remain, large internal and external hemorrhoids. Patient was referred to [facility] for removal of the remaining polyps. Recommended 6 month follow-up and next colonoscopy at [facility] most likely one year after [facility] colonoscopy.  Today he states he's doing well overall. Has his colonoscopy at [facility] and they removed 3 polyps which were tubular adenoma and made o additional follow-up recommendation. Started having diarrhea 6 weeks ago about the time he started taking [place]. He told his PCP and he subsequently stopped taking it about a week ago. Denies abdominal pain. Was having 3-5 stools a day. Is not having 2-3 stools a day, stools loose with chunks in it and toward the end wants to turn watery. No recent antibiotics. Diarrhea is worsening his hemorrhoid symptoms. Denies hematochezia, melena, fever, chills. Did have nausea with some vomiting, last episode about 2-3 days ago. Has had some weight loss subjectively which he thinks is from the diarrhea  (objective weight loss of 13 lb in 7 months). Denies chest pain, dyspnea, dizziness, lightheadedness, syncope, near syncope. Denies any other upper or lower GI symptoms.  Is on Metformin, and has been on it "for a long while."  Past Medical History:  Diagnosis Date  . Abnormal EKG    negative stress nuclear-2005; probable inferolateral scarring in 2007- negative EKG at 10 mets   . Diabetes mellitus    AODM- no insulin- fasting cbg 100-150  . 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 GERD (gastroesophageal reflux disease)   . H/O hiatal hernia   . HLD (hyperlipidemia)   . HTN (hypertension)   . Hx of dizziness   . Neuropathy ([place])   . Obesity   . Shortness of breath    exertion  . Tobacco abuse    70 pack years    Past Surgical History:  Procedure Laterality Date  . BIOPSY  07/11/2015   Procedure: BIOPSY;  Surgeon: Danie Binder, MD;  [facility];  Service: Endoscopy;;  . CATARACT EXTRACTION  2011-12   Bilateral  . COLONOSCOPY     At [facility]: polypectomy x 3 via resection, all with fragments of tubular adenoma.  . COLONOSCOPY WITH PROPOFOL N/A 07/11/2015   Procedure: COLONOSCOPY WITH PROPOFOL;  Surgeon: Danie Binder, MD;  [facility];  Service: Endoscopy;  Laterality: N/A;  200 - per office, pt can't come earlier  . HERNIA REPAIR Bilateral 1997  . JOINT REPLACEMENT     bilateral hip   . LUMBAR SPINE SURGERY  2004  Laminectomy and fusion  . POLYPECTOMY  07/11/2015   Procedure: POLYPECTOMY biopsy multiple;  Surgeon: Danie Binder, MD;  [facility];  Service: Endoscopy;;  ceacl polyp APC used  . TOTAL HIP ARTHROPLASTY  2005   Left    Current Outpatient Prescriptions  Medication Sig Dispense Refill  . ACCU-CHEK AVIVA PLUS test strip     . amLODipine-benazepril (LOTREL) 10-40 MG capsule Take 1 capsule by mouth  daily 90 capsule 1  . chlorthalidone (HYGROTON) 25 MG tablet Take 1 tablet by mouth  daily 90 tablet 3    . Cholecalciferol (VITAMIN D) 1000 UNITS capsule Take 1,000 Units by mouth daily.      Marland Kitchen doxazosin (CARDURA) 4 MG tablet Take 1 tablet by mouth  daily 90 tablet 3  . esomeprazole (NEXIUM) 40 MG capsule Take 40 mg by mouth daily.      . furosemide (LASIX) 40 MG tablet Take 40 mg daily as needed for leg swelling 90 tablet 3  . magnesium oxide (MAG-OX) 400 MG tablet Take 400 mg by mouth daily.    . meloxicam (MOBIC) 15 MG tablet Take 15 mg by mouth daily.     . metFORMIN (GLUCOPHAGE) 500 MG tablet Take 500-1,000 mg by mouth 2 (two) times daily. Takes 2 tablets in the morning and 1 tablet in the evening.    . metoprolol succinate (TOPROL-XL) 100 MG 24 hr tablet Take 1 tablet by mouth  daily with or immediatley  following a meal 90 tablet 1  . oxyCODONE-acetaminophen (PERCOCET) 10-325 MG per tablet Take 1 tablet by mouth every 8 (eight) hours as needed for pain.     . pravastatin (PRAVACHOL) 80 MG tablet Take 1 tablet by mouth  every evening 90 tablet 1  . sildenafil (VIAGRA) 50 MG tablet Take 50 mg by mouth daily as needed for erectile dysfunction.     No current facility-administered medications for this visit.     Allergies as of 01/26/2016  . (No Known Allergies)    Family History  Problem Relation Age of Onset  . Stroke Mother   . Colon cancer Neg Hx     Social History   Social History  . Marital status: Single    Spouse name: N/A  . Number of children: N/A  . Years of education: N/A   Social History Main Topics  . Smoking status: Current Every Day Smoker    Packs/day: 2.00    Years: 50.00    Types: Cigarettes  . Smokeless tobacco: Never Used     Comment: 70 pack years  . Alcohol use 0.0 oz/week     Comment: occasional; one drink about 2 times a month.  . Drug use: No  . Sexual activity: Yes    Birth control/ protection: None   Other Topics Concern  . None   Social History Narrative   Employment- [employer]. Single, 2 children. Does not regularly exercises.      Review of Systems: 10-point ROS negative except as per HPI.    Physical Exam: BP 135/72   Pulse 83   Temp 98.3 F (36.8 C) (Oral)   Ht '6\' 1"'$  (1.854 m)   Wt 294 lb 12.8 oz (133.7 kg)   BMI 38.89 kg/m  General:   Morbidly obese male. Alert and oriented. Pleasant and cooperative. Well-nourished and well-developed.  Ears:  Normal auditory acuity. Cardiovascular:  S1, S2 present without murmurs appreciated. Extremities without clubbing or edema. Respiratory:  Clear to auscultation bilaterally. No wheezes,  rales, or rhonchi. No distress.  Gastrointestinal:  +BS, obese but soft, non-tender and non-distended. No HSM noted. No guarding or rebound. No masses appreciated.  Rectal:  Deferred  Musculoskalatal:  Symmetrical without gross deformities. Neurologic:  Alert and oriented x4;  grossly normal neurologically. Psych:  Alert and cooperative. Normal mood and affect. Heme/Lymph/Immune: No excessive bruising noted.    01/26/2016 10:42 AM   Disclaimer: This note was dictated with voice recognition software. Similar sounding words can inadvertently be transcribed and may not be corrected upon review.

## 2016-01-29 NOTE — Progress Notes (Signed)
CC'ED TO PCP 

## 2016-06-25 ENCOUNTER — Encounter: Payer: Self-pay | Admitting: Gastroenterology

## 2016-06-27 ENCOUNTER — Encounter: Payer: Self-pay | Admitting: Internal Medicine

## 2016-12-04 ENCOUNTER — Ambulatory Visit (INDEPENDENT_AMBULATORY_CARE_PROVIDER_SITE_OTHER): Payer: Self-pay

## 2016-12-04 ENCOUNTER — Ambulatory Visit (INDEPENDENT_AMBULATORY_CARE_PROVIDER_SITE_OTHER): Payer: Medicare Other | Admitting: Orthopaedic Surgery

## 2016-12-04 DIAGNOSIS — M5440 Lumbago with sciatica, unspecified side: Secondary | ICD-10-CM

## 2016-12-04 DIAGNOSIS — M160 Bilateral primary osteoarthritis of hip: Secondary | ICD-10-CM

## 2016-12-04 MED ORDER — METHOCARBAMOL 500 MG PO TABS: 500.0000 mg | ORAL_TABLET | Freq: Two times a day (BID) | ORAL | 1 refills | Status: DC | PRN

## 2016-12-04 NOTE — Progress Notes (Signed)
Office Visit Note   Patient: Sean [name]           Date of Birth: 1950-10-10           MRN: [id number] Visit Date: 12/04/2016              Requested by: Jani Gravel, MD [address] [place], [place] [address] PCP: Jani Gravel, MD   Assessment & Plan: Visit Diagnoses:  1. Osteoarthritis of both hips, unspecified osteoarthritis type   2. Low back pain with sciatica, sciatica laterality unspecified, unspecified back pain laterality, unspecified chronicity   Low back pain with radiculopathy left lower extremity. I believe this originates from the lumbar spine and probably the L2-3 region.  Plan: Oxycodone with Tylenol for pain, Robaxin as a muscle relaxant. Reevaluate in the next several weeks. Consider MRI or CT scan specifically evaluating the L23 disc space. Patient is diabetic and I'm hesitant to prescribe a Medrol Dosepak. If no improvement with the above will reevaluate in the next 2 weeks.  Follow-Up Instructions: No Follow-up on file.   Orders:  Orders Placed This Encounter  Procedures  . XR Lumbar Spine 2-3 Views  . XR HIPS BILAT W OR W/O PELVIS 2V   Meds ordered this encounter  Medications  . methocarbamol (ROBAXIN) 500 MG tablet    Sig: Take 1 tablet (500 mg total) by mouth 2 (two) times daily as needed for muscle spasms.    Dispense:  20 tablet    Refill:  1      Procedures: No procedures performed   Clinical Data: No additional findings.   Subjective: No chief complaint on file. [name] history of his with about a 2 week history of low back pain associated with "stiffness" and some pain into his left thigh. He first noted onset several weeks ago after he drove to [place] to visit his family member. He notes he had a difficult time getting out of the car because he was "so stiff. He's had considerable pain in his back radiating to his left buttock the lateral aspect of his left hip and into his left thigh. He also has some difficulty with  left thigh pain when he coughs". He's had bilateral total hip replacements in the past 2006, 2011 and is not specifically complaining of groin pain. He also is status post lumbar fusion by Dr. Vertell Limber in about 4-5 years ago. He is not experiencing any trouble with his right lower extremity  HPI  Review of Systems   Objective: Vital Signs: There were no vitals taken for this visit.  Physical Exam  Ortho Exam Mr. Elmore is awake alert and oriented 3. Comfortable sitting position. Painless range of motion of both hips with internal and external rotation. Straight leg raise was positive at about 90 on the left for thigh pain. No percussible tenderness of the lumbar spine. No tenderness over the greater trochanter over either buttock. Neurovascular exam appeared to be intact  Specialty Comments:  No specialty comments available.  Imaging: Xr Hips Bilat W Or W/o Pelvis 2v  Result Date: 12/04/2016 [facility] the pelvis and lateral both hips were obtained. Patient has bilateral total hip replacements good position. There didn't appear to be any evidence of complication.. Femoral heads are centered about the acetabulum without evidence of wear. No abnormality about the pubic rami or iliac crests.  Xr Lumbar Spine 2-3 Views  Result Date: 12/04/2016 Films of lumbar spine obtained 2 projections. There is a prior  fusion of L3 to the sacrum in good position. There is significant degenerative change between L2 and L3 vacuum disc phenomenon and narrowing of the joint space. Hardware appears to be intact.    PMFS History: Patient Active Problem List   Diagnosis Date Noted  . Diarrhea 01/26/2016  . Nausea with vomiting 01/26/2016  . History of adenomatous polyp of colon 01/26/2016  . Encounter for screening colonoscopy 07/06/2015  . Spondylolisthesis of lumbar region 08/17/2012  . Hypokalemia 12/17/2010  . DJD (degenerative joint disease)   . Diabetes mellitus type II 01/25/2010  . Hyperlipidemia  01/25/2010  . Tobacco abuse 01/25/2010  . Hypertension 05/29/2009   Past Medical History:  Diagnosis Date  . Abnormal EKG    negative stress nuclear-2005; probable inferolateral scarring in 2007- negative EKG at 10 mets   . Diabetes mellitus    AODM- no insulin- fasting cbg 100-150  . 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 GERD (gastroesophageal reflux disease)   . H/O hiatal hernia   . HLD (hyperlipidemia)   . HTN (hypertension)   . Hx of dizziness   . Neuropathy ([place])   . Obesity   . Shortness of breath    exertion  . Tobacco abuse    70 pack years    Family History  Problem Relation Age of Onset  . Stroke Mother   . Colon cancer Neg Hx     Past Surgical History:  Procedure Laterality Date  . BIOPSY  07/11/2015   Procedure: BIOPSY;  Surgeon: Danie Binder, MD;  [facility];  Service: Endoscopy;;  . CATARACT EXTRACTION  2011-12   Bilateral  . COLONOSCOPY     At [facility]: polypectomy x 3 via resection, all with fragments of tubular adenoma.  . COLONOSCOPY WITH PROPOFOL N/A 07/11/2015   Procedure: COLONOSCOPY WITH PROPOFOL;  Surgeon: Danie Binder, MD;  [facility];  Service: Endoscopy;  Laterality: N/A;  200 - per office, pt can't come earlier  . HERNIA REPAIR Bilateral 1997  . JOINT REPLACEMENT     bilateral hip   . LUMBAR SPINE SURGERY  2004   Laminectomy and fusion  . POLYPECTOMY  07/11/2015   Procedure: POLYPECTOMY biopsy multiple;  Surgeon: Danie Binder, MD;  [facility];  Service: Endoscopy;;  ceacl polyp APC used  . TOTAL HIP ARTHROPLASTY  2005   Left   Social History   Occupational History  . Not on file.   Social History Main Topics  . Smoking status: Current Every Day Smoker    Packs/day: 2.00    Years: 50.00    Types: Cigarettes  . Smokeless tobacco: Never Used     Comment: 70 pack years  . Alcohol use 0.0 oz/week     Comment: occasional; one drink about 2 times a month.  . Drug  use: No  . Sexual activity: Yes    Birth control/ protection: None     Garald Balding, MD   Note - This record has been created using [employer].  Chart creation errors have been sought, but may not always  have been located. Such creation errors do not reflect on  the standard of medical care.

## 2016-12-05 ENCOUNTER — Other Ambulatory Visit (INDEPENDENT_AMBULATORY_CARE_PROVIDER_SITE_OTHER): Payer: Self-pay

## 2016-12-05 MED ORDER — OXYCODONE-ACETAMINOPHEN 5-325 MG PO TABS: 1.0000 | ORAL_TABLET | Freq: Three times a day (TID) | ORAL | 0 refills | Status: DC | PRN

## 2016-12-05 NOTE — Addendum Note (Signed)
Addended by: Mervyn Skeeters on: 12/05/2016 01:08 PM   Modules accepted: Orders

## 2016-12-18 ENCOUNTER — Ambulatory Visit (INDEPENDENT_AMBULATORY_CARE_PROVIDER_SITE_OTHER): Payer: Medicare Other | Admitting: Orthopaedic Surgery

## 2016-12-18 DIAGNOSIS — M5441 Lumbago with sciatica, right side: Secondary | ICD-10-CM | POA: Diagnosis not present

## 2016-12-18 DIAGNOSIS — G8929 Other chronic pain: Secondary | ICD-10-CM | POA: Diagnosis not present

## 2016-12-18 DIAGNOSIS — M5442 Lumbago with sciatica, left side: Secondary | ICD-10-CM

## 2016-12-18 NOTE — Progress Notes (Signed)
Office Visit Note   Patient: Sean [name]           Date of Birth: 06-10-1950           MRN: [id number] Visit Date: 12/18/2016              Requested by: Jani Gravel, MD [address] [place], [place] [address] PCP: Jani Gravel, MD   Assessment & Plan: Visit Diagnoses:  1. Chronic midline low back pain with bilateral sciatica     Plan: No improvement over the past several weeks. We will renew prescription for oxycodone, schedule MRI scan of lumbar spine looking for pathology around L2-3 and make an appointment for him to see Dr. Vertell Limber.  Follow-Up Instructions: Return after MRI L_S spine.   Orders:  No orders of the defined types were placed in this encounter.  No orders of the defined types were placed in this encounter.     Procedures: No procedures performed   Clinical Data: No additional findings.   Subjective: Chief Complaint  Patient presents with  . Right Hip - Pain    [name] is here for a F/U of BIL hip pain, hx of lumbar surgery by Vertell Limber. He relates the oxycodone nor the Robaxin helps with the pain, especially at night. Also on Mobic for OA  . Left Hip - Pain    Pt is diabetic and has numbness and tingling in his feet x 10 years.   No improvement over the past several weeks. He is having difficulty when he coughs or sneezes with low back pain with referred discomfort to both hips and thighs. He does have some tingling and burning in both of his feet probably related to diabetic neuropathy. He's tried gabapentin in the past with reaction to the medicine. Prior films of his hip replacements note no abnormality  HPI  Review of Systems   Objective: Vital Signs: There were no vitals taken for this visit.  Physical Exam  Ortho Exam straight leg raise negative bilaterally. Deep tendon reflexes are symmetrical. Tingling in both of his feet probably related to his neuropathy. Painless range of motion of both hips. No pain along either greater  trochanter L percussible tenderness lumbar spine. No buttock discomfort. No pain about either knee. Does walk with a cane  Specialty Comments:  No specialty comments available.  Imaging: No results found.   PMFS History: Patient Active Problem List   Diagnosis Date Noted  . Diarrhea 01/26/2016  . Nausea with vomiting 01/26/2016  . History of adenomatous polyp of colon 01/26/2016  . Encounter for screening colonoscopy 07/06/2015  . Spondylolisthesis of lumbar region 08/17/2012  . Hypokalemia 12/17/2010  . DJD (degenerative joint disease)   . Diabetes mellitus type II 01/25/2010  . Hyperlipidemia 01/25/2010  . Tobacco abuse 01/25/2010  . Hypertension 05/29/2009   Past Medical History:  Diagnosis Date  . Abnormal EKG    negative stress nuclear-2005; probable inferolateral scarring in 2007- negative EKG at 10 mets   . Diabetes mellitus    AODM- no insulin- fasting cbg 100-150  . 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 GERD (gastroesophageal reflux disease)   . H/O hiatal hernia   . HLD (hyperlipidemia)   . HTN (hypertension)   . Hx of dizziness   . Neuropathy ([place])   . Obesity   . Shortness of breath    exertion  . Tobacco abuse    70 pack years  Family History  Problem Relation Age of Onset  . Stroke Mother   . Colon cancer Neg Hx     Past Surgical History:  Procedure Laterality Date  . BIOPSY  07/11/2015   Procedure: BIOPSY;  Surgeon: Danie Binder, MD;  [facility];  Service: Endoscopy;;  . CATARACT EXTRACTION  2011-12   Bilateral  . COLONOSCOPY     At [facility]: polypectomy x 3 via resection, all with fragments of tubular adenoma.  . COLONOSCOPY WITH PROPOFOL N/A 07/11/2015   Procedure: COLONOSCOPY WITH PROPOFOL;  Surgeon: Danie Binder, MD;  [facility];  Service: Endoscopy;  Laterality: N/A;  200 - per office, pt can't come earlier  . HERNIA REPAIR Bilateral 1997  . JOINT REPLACEMENT     bilateral hip   .  LUMBAR SPINE SURGERY  2004   Laminectomy and fusion  . POLYPECTOMY  07/11/2015   Procedure: POLYPECTOMY biopsy multiple;  Surgeon: Danie Binder, MD;  [facility];  Service: Endoscopy;;  ceacl polyp APC used  . TOTAL HIP ARTHROPLASTY  2005   Left   Social History   Occupational History  . Not on file.   Social History Main Topics  . Smoking status: Current Every Day Smoker    Packs/day: 2.00    Years: 50.00    Types: Cigarettes  . Smokeless tobacco: Never Used     Comment: 70 pack years  . Alcohol use 0.0 oz/week     Comment: occasional; one drink about 2 times a month.  . Drug use: No  . Sexual activity: Yes    Birth control/ protection: None     Garald Balding, MD   Note - This record has been created using [employer].  Chart creation errors have been sought, but may not always  have been located. Such creation errors do not reflect on  the standard of medical care.

## 2016-12-24 ENCOUNTER — Other Ambulatory Visit: Payer: Self-pay

## 2016-12-24 DIAGNOSIS — M5440 Lumbago with sciatica, unspecified side: Secondary | ICD-10-CM

## 2017-01-05 ENCOUNTER — Ambulatory Visit
Admission: RE | Admit: 2017-01-05 | Discharge: 2017-01-05 | Disposition: A | Discharge status: Home / Self Care | Payer: Medicare Other | Source: Ambulatory Visit | Attending: Orthopaedic Surgery | Admitting: Orthopaedic Surgery

## 2017-01-05 DIAGNOSIS — M5440 Lumbago with sciatica, unspecified side: Secondary | ICD-10-CM

## 2017-01-06 ENCOUNTER — Telehealth (INDEPENDENT_AMBULATORY_CARE_PROVIDER_SITE_OTHER): Payer: Self-pay | Admitting: Orthopaedic Surgery

## 2017-01-06 NOTE — Telephone Encounter (Signed)
Patient called to discuss results of MRI he had done. ? Patient to referred to another doctor. Please call to advise.

## 2017-01-06 NOTE — Telephone Encounter (Signed)
Patient called to check status of referral from Dr. Durward Fortes to another doctor? Please advise.

## 2017-01-07 ENCOUNTER — Other Ambulatory Visit (INDEPENDENT_AMBULATORY_CARE_PROVIDER_SITE_OTHER): Payer: Self-pay

## 2017-01-07 DIAGNOSIS — M5440 Lumbago with sciatica, unspecified side: Secondary | ICD-10-CM

## 2017-01-07 NOTE — Telephone Encounter (Signed)
Patient calling in reference to messages he left yesterday. Advised patient would request Marcie Bal to call, and confirm referral has been sent to Dr. Vertell Limber, and patient awaiting Dr. Lyla Glassing office to call him. Please call patient to confirm.

## 2017-01-07 NOTE — Telephone Encounter (Signed)
Referral sent 

## 2017-01-10 NOTE — Telephone Encounter (Signed)
Checked referral notes and current note 01/09/17 showed info received sent to dr

## 2017-01-22 ENCOUNTER — Telehealth (INDEPENDENT_AMBULATORY_CARE_PROVIDER_SITE_OTHER): Payer: Self-pay

## 2017-01-22 DIAGNOSIS — M160 Bilateral primary osteoarthritis of hip: Secondary | ICD-10-CM

## 2017-01-22 MED ORDER — OXYCODONE-ACETAMINOPHEN 5-325 MG PO TABS
1.0000 | ORAL_TABLET | Freq: Two times a day (BID) | ORAL | 0 refills | Status: DC | PRN
Start: 2017-01-22 — End: 2018-09-18

## 2017-01-22 MED ORDER — OXYCODONE-ACETAMINOPHEN 5-325 MG PO TABS: 1.0000 | ORAL_TABLET | Freq: Two times a day (BID) | ORAL | 0 refills | Status: DC | PRN

## 2017-01-22 NOTE — Telephone Encounter (Signed)
Spoke with Mr. Feldpausch and told him a rx at [facility], [place].

## 2018-01-20 ENCOUNTER — Other Ambulatory Visit: Payer: Self-pay

## 2018-01-20 ENCOUNTER — Encounter (HOSPITAL_COMMUNITY): Payer: Self-pay | Admitting: Emergency Medicine

## 2018-01-20 ENCOUNTER — Emergency Department (HOSPITAL_COMMUNITY): Payer: Medicare Other

## 2018-01-20 ENCOUNTER — Emergency Department (HOSPITAL_COMMUNITY)
Admission: EM | Admit: 2018-01-20 | Discharge: 2018-01-20 | Disposition: A | Discharge status: Home / Self Care | Payer: Medicare Other | Attending: Emergency Medicine | Admitting: Emergency Medicine

## 2018-01-20 DIAGNOSIS — Z79899 Other long term (current) drug therapy: Secondary | ICD-10-CM | POA: Diagnosis not present

## 2018-01-20 DIAGNOSIS — F17210 Nicotine dependence, cigarettes, uncomplicated: Secondary | ICD-10-CM | POA: Insufficient documentation

## 2018-01-20 DIAGNOSIS — I10 Essential (primary) hypertension: Secondary | ICD-10-CM | POA: Diagnosis not present

## 2018-01-20 DIAGNOSIS — M436 Torticollis: Secondary | ICD-10-CM

## 2018-01-20 DIAGNOSIS — Z96643 Presence of artificial hip joint, bilateral: Secondary | ICD-10-CM | POA: Insufficient documentation

## 2018-01-20 DIAGNOSIS — Z7984 Long term (current) use of oral hypoglycemic drugs: Secondary | ICD-10-CM | POA: Diagnosis not present

## 2018-01-20 DIAGNOSIS — M542 Cervicalgia: Secondary | ICD-10-CM | POA: Diagnosis present

## 2018-01-20 DIAGNOSIS — E1140 Type 2 diabetes mellitus with diabetic neuropathy, unspecified: Secondary | ICD-10-CM | POA: Insufficient documentation

## 2018-01-20 HISTORY — DX: Pain in left hip: M25.552

## 2018-01-20 HISTORY — DX: Paresthesia of skin: R20.2

## 2018-01-20 HISTORY — DX: Other chronic pain: G89.29

## 2018-01-20 HISTORY — DX: Pain in right hip: M25.551

## 2018-01-20 HISTORY — DX: Dorsalgia, unspecified: M54.9

## 2018-01-20 HISTORY — DX: Sciatica, left side: M54.31

## 2018-01-20 HISTORY — DX: Sciatica, right side: M54.31

## 2018-01-20 HISTORY — DX: Sciatica, left side: M54.32

## 2018-01-20 MED ORDER — METHOCARBAMOL 500 MG PO TABS
750.0000 mg | ORAL_TABLET | Freq: Once | ORAL | Status: AC
  Administered 2018-01-20: 750 mg via ORAL
  Filled 2018-01-20: qty 2

## 2018-01-20 MED ORDER — METHOCARBAMOL 500 MG PO TABS: 500.0000 mg | ORAL_TABLET | Freq: Four times a day (QID) | ORAL | 0 refills | Status: DC

## 2018-01-20 MED ORDER — OXYCODONE-ACETAMINOPHEN 5-325 MG PO TABS
1.0000 | ORAL_TABLET | Freq: Once | ORAL | Status: AC
  Administered 2018-01-20: 1 via ORAL
  Filled 2018-01-20: qty 1

## 2018-01-20 NOTE — ED Provider Notes (Signed)
[facility] EMERGENCY DEPARTMENT Provider Note   CSN: [id number] Arrival date & time: 01/20/18  1439     History   Chief Complaint Chief Complaint  Patient presents with  . Back Pain    HPI Sean [name] is a 67 y.o. 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  He denies injury or overuse and denies prior history of neck problems.  He also denies weakness or numbness in his extremities, also no headache, n/v fevers or chills.  He is scheduled to see Dr. Vertell Limber, his neurosurgeon tomorrow afternoon.   Pt takes oxycodone regularly for his low back pain but missed his 2:30 dose today.    The history is provided by the patient.    Past Medical History:  Diagnosis Date  . Abnormal EKG    negative stress nuclear-2005; probable inferolateral scarring in 2007- negative EKG at 10 mets   . Bilateral sciatica   . Chronic back pain   . Chronic hip pain, bilateral   . Diabetes mellitus    AODM- no insulin- fasting cbg 100-150  . 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 GERD (gastroesophageal reflux disease)   . H/O hiatal hernia   . HLD (hyperlipidemia)   . HTN (hypertension)   . Hx of dizziness   . Neuropathy   . Obesity   . Paresthesia of foot, bilateral   . Shortness of breath    exertion  . Tobacco abuse    70 pack years    Patient Active Problem List   Diagnosis Date Noted  . Diarrhea 01/26/2016  . Nausea with vomiting 01/26/2016  . History of adenomatous polyp of colon 01/26/2016  . Encounter for screening colonoscopy 07/06/2015  . Spondylolisthesis of lumbar region 08/17/2012  . Hypokalemia 12/17/2010  . DJD (degenerative joint disease)   . Diabetes mellitus type II 01/25/2010  . Hyperlipidemia 01/25/2010  . Tobacco abuse 01/25/2010  . Hypertension 05/29/2009    Past Surgical History:  Procedure  Laterality Date  . BIOPSY  07/11/2015   Procedure: BIOPSY;  Surgeon: Danie Binder, MD;  [facility];  Service: Endoscopy;;  . CATARACT EXTRACTION  2011-12   Bilateral  . COLONOSCOPY     At [facility]: polypectomy x 3 via resection, all with fragments of tubular adenoma.  . COLONOSCOPY WITH PROPOFOL N/A 07/11/2015   Procedure: COLONOSCOPY WITH PROPOFOL;  Surgeon: Danie Binder, MD;  Location: [facility];  Service: Endoscopy;  Laterality: N/A;  200 - per office, pt can't come earlier  . HERNIA REPAIR Bilateral 1997  . JOINT REPLACEMENT     bilateral hip   . LUMBAR SPINE SURGERY  2004   Laminectomy and fusion  . POLYPECTOMY  07/11/2015   Procedure: POLYPECTOMY biopsy multiple;  Surgeon: Danie Binder, MD;  Location: [facility];  Service: Endoscopy;;  ceacl polyp APC used  . TOTAL HIP ARTHROPLASTY  2005   Left        Home Medications    Prior to Admission medications   Medication Sig Start Date End Date Taking? Authorizing Provider  ACCU-CHEK AVIVA PLUS test strip  12/17/10   [provider]  amLODipine-benazepril (LOTREL) 10-40 MG capsule Take 1 capsule by mouth  daily 10/02/15   Arnoldo Lenis, MD  chlorthalidone (HYGROTON) 25 MG tablet Take 1 tablet by  mouth  daily 07/27/15   Arnoldo Lenis, MD  Cholecalciferol (VITAMIN D) 1000 UNITS capsule Take 1,000 Units by mouth daily.      [provider]  doxazosin (CARDURA) 4 MG tablet Take 1 tablet by mouth  daily 07/27/15   Arnoldo Lenis, MD  esomeprazole (NEXIUM) 40 MG capsule Take 40 mg by mouth daily.      [provider]  furosemide (LASIX) 40 MG tablet Take 40 mg daily as needed for leg swelling 02/09/15   Arnoldo Lenis, MD  magnesium oxide (MAG-OX) 400 MG tablet Take 400 mg by mouth daily.    [provider]  meloxicam (MOBIC) 15 MG tablet Take 15 mg by mouth daily.  07/30/12   [provider]  metFORMIN (GLUCOPHAGE) 500 MG tablet Take 500-1,000 mg by mouth 2  (two) times daily. Takes 2 tablets in the morning and 1 tablet in the evening.    [provider]  methocarbamol (ROBAXIN) 500 MG tablet Take 1 tablet (500 mg total) by mouth 4 (four) times daily. 01/20/18   Evalee Jefferson, PA-C  metoprolol succinate (TOPROL-XL) 100 MG 24 hr tablet Take 1 tablet by mouth  daily with or immediatley  following a meal 10/02/15   Arnoldo Lenis, MD  oxyCODONE-acetaminophen (PERCOCET/ROXICET) 5-325 MG tablet Take 1 tablet by mouth every 12 (twelve) hours as needed for severe pain. 01/22/17   Garald Balding, MD  pravastatin (PRAVACHOL) 80 MG tablet Take 1 tablet by mouth  every evening 10/02/15   Arnoldo Lenis, MD  sildenafil (VIAGRA) 50 MG tablet Take 50 mg by mouth daily as needed for erectile dysfunction.    [provider]    Family History Family History  Problem Relation Age of Onset  . Stroke Mother   . Colon cancer Neg Hx     Social History Social History   Tobacco Use  . Smoking status: Current Every Day Smoker    Packs/day: 2.00    Years: 50.00    Pack years: 100.00    Types: Cigarettes  . Smokeless tobacco: Never Used  . Tobacco comment: 70 pack years  Substance Use Topics  . Alcohol use: Yes    Alcohol/week: 0.0 standard drinks    Comment: occasional; one drink about 2 times a month.  . Drug use: No     Allergies   Patient has no known allergies.   Review of Systems Review of Systems  Constitutional: Negative for chills and fever.  Musculoskeletal: Positive for arthralgias and neck pain. Negative for joint swelling and myalgias.  Neurological: Negative for weakness and numbness.     Physical Exam Updated Vital Signs BP 124/68 (BP Location: Left Arm)   Pulse 85   Temp 98.9 F (37.2 C)   Resp 19   Ht 6\' 1"  (1.854 m)   Wt 136.1 kg   SpO2 93%   BMI 39.58 kg/m   Physical Exam  Constitutional: He appears well-developed and well-nourished.  HENT:  Head: Normocephalic.  Eyes: Conjunctivae are normal.    Neck: Neck supple. Muscular tenderness present. No spinous process tenderness present. Decreased range of motion present.  ttp along right SCM muscle.  Decreased ROM with bilateral head rotation.    Cardiovascular: Normal rate and intact distal pulses.  Pedal pulses normal.  Pulmonary/Chest: Effort normal.  Abdominal: Soft. Bowel sounds are normal. He exhibits no distension and no mass.  Musculoskeletal: He exhibits no edema.       Lumbar back: He  exhibits tenderness. He exhibits no swelling, no edema and no spasm.  Neurological: He is alert. He has normal strength. He displays no atrophy and no tremor. No sensory deficit. Gait normal.  No strength deficit noted in wrist and elbow flex/ extensor muscle groups. Equal grip strength.  Skin: Skin is warm and dry.  Psychiatric: He has a normal mood and affect.  Nursing note and vitals reviewed.    ED Treatments / Results  Labs (all labs ordered are listed, but only abnormal results are displayed) Labs Reviewed - No data to display  EKG None  Radiology Dg Cervical Spine Complete  Result Date: 01/20/2018 CLINICAL DATA:  Neck pain, reduced range of motion EXAM: CERVICAL SPINE - COMPLETE 4+ VIEW COMPARISON:  06/08/2012 FINDINGS: Reversal of cervical lordosis question muscle spasm. Bones demineralized. Multilevel disc space narrowing and endplate spur formation. Prevertebral soft tissues upper normal thickness at the upper cervical region. Lateral cervical flexion to the RIGHT. Vertebral body heights maintained without fracture or subluxation. No bone destruction. Scattered facet degenerative changes. Apices clear. IMPRESSION: Degenerative disc and facet disease changes of the cervical spine with lateral cervical flexion to the RIGHT. Electronically Signed   By: Lavonia Dana M.D.   On: 01/20/2018 16:30    Procedures Procedures (including critical care time)  Medications Ordered in ED Medications  oxyCODONE-acetaminophen (PERCOCET/ROXICET)  5-325 MG per tablet 1 tablet (1 tablet Oral Given 01/20/18 1543)  methocarbamol (ROBAXIN) tablet 750 mg (750 mg Oral Given 01/20/18 1543)     Initial Impression / Assessment and Plan / ED Course  I have reviewed the triage vital signs and the nursing notes.  Pertinent labs & imaging results that were available during my care of the patient were reviewed by me and considered in my medical decision making (see chart for details).     Pt with djd of c spine, torticollis per exam and hx.  No neuro deficits on exam.  Robaxin added, discussed heat tx.  Plan f/u with Dr Vertell Limber tomorrow as originally scheduled by pt.    Pt was seen by Dr. Laverta Baltimore prior to dc home.  Final Clinical Impressions(s) / ED Diagnoses   Final diagnoses:  Torticollis, acute    ED Discharge Orders         Ordered    methocarbamol (ROBAXIN) 500 MG tablet  4 times daily     01/20/18 1651           Evalee Jefferson, PA-C 01/20/18 1709    Long, Wonda Old[facility], MD 01/21/18 1420

## 2018-01-20 NOTE — ED Triage Notes (Signed)
Patient back pain x 1 month that has moved to his neck. Denies fever, chills, nausea, vomiting or diarrhea. States he has been taking oxycodone for pain at home for pain control. He has appointment with PCP tomorrow. States he could not wait due to pain.

## 2018-01-20 NOTE — Discharge Instructions (Addendum)
Continue taking your oxycodone.  Add the Robaxin for assistance with muscle spasm.  Apply heating pad to your neck for 15 to 20 minutes several times daily.  Keep your appointment with Dr. Vertell Limber tomorrow as planned.

## 2018-01-27 ENCOUNTER — Other Ambulatory Visit: Payer: Self-pay | Admitting: Neurosurgery

## 2018-01-27 DIAGNOSIS — M5412 Radiculopathy, cervical region: Secondary | ICD-10-CM

## 2018-02-01 ENCOUNTER — Other Ambulatory Visit: Payer: Medicare Other

## 2018-02-01 ENCOUNTER — Ambulatory Visit
Admission: RE | Admit: 2018-02-01 | Discharge: 2018-02-01 | Disposition: A | Discharge status: Home / Self Care | Payer: Medicare Other | Source: Ambulatory Visit | Attending: Neurosurgery | Admitting: Neurosurgery

## 2018-02-01 DIAGNOSIS — M5412 Radiculopathy, cervical region: Secondary | ICD-10-CM

## 2018-02-16 ENCOUNTER — Encounter (HOSPITAL_COMMUNITY): Payer: Self-pay

## 2018-02-16 ENCOUNTER — Ambulatory Visit (HOSPITAL_COMMUNITY): Payer: Medicare Other | Attending: Neurosurgery

## 2018-02-16 ENCOUNTER — Other Ambulatory Visit: Payer: Self-pay

## 2018-02-16 DIAGNOSIS — M6281 Muscle weakness (generalized): Secondary | ICD-10-CM | POA: Diagnosis present

## 2018-02-16 DIAGNOSIS — M542 Cervicalgia: Secondary | ICD-10-CM | POA: Insufficient documentation

## 2018-02-16 DIAGNOSIS — R293 Abnormal posture: Secondary | ICD-10-CM | POA: Insufficient documentation

## 2018-02-16 DIAGNOSIS — R29898 Other symptoms and signs involving the musculoskeletal system: Secondary | ICD-10-CM | POA: Insufficient documentation

## 2018-02-16 NOTE — Patient Instructions (Signed)
Chin Tuck    Tuck chin in, then down. Hold 2-3____ seconds. Relax. Repeat _10___ times. Do __1__ sessions per day.  http://gt2.exer.us/836   Copyright  VHI. All rights reserved.

## 2018-02-16 NOTE — Therapy (Signed)
[facility] [address] [place], [place], [address] Phone: [phone]   Fax:  [phone]  Physical Therapy Evaluation  Patient Details  Name: HAWKINS SEAMAN MRN: [id number] Date of Birth: 12-21-1950 Referring Provider (PT): Erline Levine, MD   Encounter Date: 02/16/2018  PT End of Session - 02/16/18 1857    Visit Number  1    Number of Visits  8    Date for PT Re-Evaluation  03/16/18    Authorization Type  [employer] Medicare $40 co-pay; visits based on medical necessity, calendar year benefits, PT/SLP combined, no auth.    Authorization Time Period  cert 94/76/54-65/07/5463    Authorization - Visit Number  1    Authorization - Number of Visits  10    PT Start Time  1316    PT Stop Time  1346    PT Time Calculation (min)  30 min    Activity Tolerance  Patient tolerated treatment well    Behavior During Therapy  WFL for tasks assessed/performed       Past Medical History:  Diagnosis Date  . Abnormal EKG    negative stress nuclear-2005; probable inferolateral scarring in 2007- negative EKG at 10 mets   . Bilateral sciatica   . Chronic back pain   . Chronic hip pain, bilateral   . Diabetes mellitus    AODM- no insulin- fasting cbg 100-150  . 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 GERD (gastroesophageal reflux disease)   . H/O hiatal hernia   . HLD (hyperlipidemia)   . HTN (hypertension)   . Hx of dizziness   . Neuropathy   . Obesity   . Paresthesia of foot, bilateral   . Shortness of breath    exertion  . Tobacco abuse    70 pack years    Past Surgical History:  Procedure Laterality Date  . BIOPSY  07/11/2015   Procedure: BIOPSY;  Surgeon: Danie Binder, MD;  [facility];  Service: Endoscopy;;  . CATARACT EXTRACTION  2011-12   Bilateral  . COLONOSCOPY     At [facility]: polypectomy x 3 via resection, all with fragments of tubular adenoma.  . COLONOSCOPY WITH PROPOFOL N/A 07/11/2015   Procedure: COLONOSCOPY WITH PROPOFOL;  Surgeon: Danie Binder, MD;  Location[facility];  Service: Endoscopy;  Laterality: N/A;  200 - per office, pt can't come earlier  . HERNIA REPAIR Bilateral 1997  . JOINT REPLACEMENT     bilateral hip   . LUMBAR SPINE SURGERY  2004   Laminectomy and fusion  . POLYPECTOMY  07/11/2015   Procedure: POLYPECTOMY biopsy multiple;  Surgeon: Danie Binder, MD;  Location[facility];  Service: Endoscopy;;  ceacl polyp APC used  . TOTAL HIP ARTHROPLASTY  2005   Left    There were no vitals filed for this visit.   Subjective Assessment - 02/16/18 1334    Subjective  Neck started huring him about 2 months ago. Could only look straight ahead. Steiods really helped decrease his pain. No longer taking steriods but taking hydrocodone which helps control the pain. Not sure but wonders if left leg pain/tingling may be connected to neck pain. Both feet tingling. Left leg has been hurting more since his neck pain started. Started walking with cane more due to left leg pain. Denies numbness and tingling in arms.    Pertinent History  LBP w/ radiculopathy, bilateral THA, hernia repair,  HLD, Arthritis, Back pain, BMI over 30, Diabetes Type I or II, High Blood Pressure, Incontinence,  Prior Surgery, Prosthesis / Implants,    Visual Impairment     Limitations  Sitting;Lifting    How long can you sit comfortably?  15-30 minutes gets stiff; lifting a heavier object overhead    Diagnostic tests  MRI - multilevel spondylosis and some canal stenosi particularly at C3-4 level w/ AP diamter of 7-8 mm of spinal canal. Does not appear to be significant cord compression. Appears to be cervical facet arthropathy at multiple levels.     Patient Stated Goals  Get rid of pain in neck.     Currently in Pain?  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    Referring Provider (PT)  Erline Levine, MD    Onset Date/Surgical Date  02/10/18    Hand Dominance  Right    Next MD Visit  late November 2019    Prior Therapy  No      Precautions   Precautions  None      Restrictions   Weight Bearing Restrictions  No      Prior Function   Level of Independence  Independent with community mobility with device      Cognition   Overall Cognitive Status  Within Functional Limits for tasks assessed      Observation/Other Assessments   Focus on Therapeutic Outcomes (FOTO)   37% limited      Posture/Postural Control   Posture/Postural Control  Postural limitations    Postural Limitations  Rounded Shoulders;Forward head;Decreased lumbar lordosis;Increased thoracic kyphosis;Posterior pelvic tilt    Posture Comments  patient reports sitting straight up hurts his low back      ROM / Strength   AROM / PROM / Strength  Strength      AROM   AROM Assessment Site  Cervical    Cervical Flexion  WNL    Cervical Extension  WNL   pain at end range   Cervical - Right Side Bend  50%   right pain at end range   Cervical - Left Side Bend  50%   right pain at end range   Cervical - Right Rotation  70%   right pain at end range   Cervical - Left Rotation  70%   left pain at end range     Strength   Strength Assessment Site  Shoulder;Elbow    Right/Left Shoulder  Right;Left    Right Shoulder Flexion  4/5    Right Shoulder Extension  4/5    Right Shoulder ABduction  4/5    Right Shoulder Internal Rotation  4/5    Right  Shoulder External Rotation  4-/5  Left Shoulder Flexion  4-/5    Left Shoulder Extension  4/5    Left Shoulder ABduction  4-/5    Left Shoulder Internal Rotation  4/5    Left Shoulder External Rotation  4-/5    Right/Left Elbow  Right;Left    Right Elbow Flexion  4+/5    Right Elbow Extension  4/5    Left Elbow Flexion  4/5    Left Elbow Extension  4/5                Objective measurements completed on examination: See above findings.              PT Education - 02/16/18 1856    Education Details  Examination findings and POC    Person(s) Educated  Patient    Methods  Explanation;Demonstration;Handout    Comprehension  Verbalized understanding;Need further instruction       PT Short Term Goals - 02/16/18 1916      PT SHORT TERM GOAL #1   Title  Patient will be independent in initial HEP and perform on a regular basis.     Time  2    Period  Weeks    Status  New    Target Date  03/02/18      PT SHORT TERM GOAL #2   Title  Patient will report no > than 3/10 pain in neck with cervical spine movements.    Baseline  initial - 4/10 day of eval    Time  2    Period  Weeks    Status  New        PT Long Term Goals - 02/16/18 1921      PT LONG TERM GOAL #1   Title  Patient will be independent in performing an advanced HEP and perform on a regular basis to maintain ROM and control pain.     Time  4    Period  Weeks    Status  New      PT LONG TERM GOAL #2   Title  Patient will exhibit a 10% or > improvement on FOTO score indicating an improvement in perceived functional abilities.     Baseline  initial - 37% limited    Time  4    Period  Weeks    Status  New      PT LONG TERM GOAL #3   Title  Patient will report being able to drive functionally without complaints of neck pain.     Time  4    Period  Weeks    Status  New             Plan - 02/16/18 1912    Clinical Impression Statement  Patient is a 67 year old male presenting to [facility]  with complaints of neck pain and decreased range of motion. Patient also has a long history of LBP and symptoms into his legs, coupled with possible neurological effects of DMII. Patient denies numbness or tingling into UEs. His neck symptoms significantly improved with a round of steroids. He continues to hydrocodone to control his pain. Patient exhibited decreased cervical spine AROM, decreased UE strength, impaired posture, functional limitations, and pain complaints. Patient would benefit from skilled physical therapy to address the aforementioned deficits.     History and Personal Factors relevant to plan of care:  LBP w/ radiculopathy, bilateral THA, hernia repair, HLD, Arthritis, Back pain, BMI over 30, Diabetes Type I or II, High Blood  Pressure, Incontinence,  Prior Surgery, Prosthesis / Implants,    Visual Impairment     Clinical Presentation  Stable    Clinical Presentation due to:  FOTO, MMT, C-spine AROM, pain, posture, clinical judgement    Clinical Decision Making  Low    Rehab Potential  Fair    Clinical Impairments Affecting Rehab Potential  positive - positive effect of steriods to decrease his pain; negative - significant medical history involving low back with 3 prior lumbar surgeries    PT Frequency  2x / week    PT Duration  4 weeks    PT Treatment/Interventions  ADLs/Self Care Home Management;Aquatic Therapy;Traction;Therapeutic activities;Therapeutic exercise;Neuromuscular re-education;Patient/family education;Manual techniques;Passive range of motion;Dry needling;Energy conservation;Spinal Manipulations;Joint Manipulations    PT Next Visit Plan  reivew goals/eval, HEP, assess c-spine joint mobility, initiate postural strengthening exercises for scapular and cervical muscles; consider supine and seated exercises for patient's low back pain    PT Home Exercise Plan  initial - seated chin tuck/head lift    Consulted and Agree with Plan of Care  Patient       Patient will benefit  from skilled therapeutic intervention in order to improve the following deficits and impairments:  Pain, Postural dysfunction, Decreased activity tolerance, Decreased range of motion, Decreased strength, Impaired UE functional use  Visit Diagnosis: Cervicalgia  Muscle weakness (generalized)  Abnormal posture  Other symptoms and signs involving the musculoskeletal system     Problem List Patient Active Problem List   Diagnosis Date Noted  . Diarrhea 01/26/2016  . Nausea with vomiting 01/26/2016  . History of adenomatous polyp of colon 01/26/2016  . Encounter for screening colonoscopy 07/06/2015  . Spondylolisthesis of lumbar region 08/17/2012  . Hypokalemia 12/17/2010  . DJD (degenerative joint disease)   . Diabetes mellitus type II 01/25/2010  . Hyperlipidemia 01/25/2010  . Tobacco abuse 01/25/2010  . Hypertension 05/29/2009    Floria Raveling. Hartnett-Rands, MS, PT [facility] #[id number] 02/16/2018, 7:27 PM  [facility] [address] [place], [place], [address] Phone: [phone]   Fax:  [phone]  Name: HARLEM BULA MRN: [id number] Date of Birth: 1951-01-06

## 2018-02-18 ENCOUNTER — Encounter (HOSPITAL_COMMUNITY): Payer: Self-pay

## 2018-02-18 ENCOUNTER — Ambulatory Visit (HOSPITAL_COMMUNITY): Payer: Medicare Other | Attending: Neurosurgery

## 2018-02-18 DIAGNOSIS — R29898 Other symptoms and signs involving the musculoskeletal system: Secondary | ICD-10-CM | POA: Diagnosis present

## 2018-02-18 DIAGNOSIS — M6281 Muscle weakness (generalized): Secondary | ICD-10-CM

## 2018-02-18 DIAGNOSIS — M542 Cervicalgia: Secondary | ICD-10-CM

## 2018-02-18 DIAGNOSIS — R293 Abnormal posture: Secondary | ICD-10-CM | POA: Insufficient documentation

## 2018-02-18 NOTE — Therapy (Signed)
[facility] [address] [place], [place], [address] Phone: [phone]   Fax:  [phone]  Physical Therapy Treatment  Patient Details  Name: Sean [name] MRN: [id number] Date of Birth: 20-Jun-1950 Referring Provider (PT): Erline Levine, MD   Encounter Date: 02/18/2018  PT End of Session - 02/18/18 1357    Visit Number  2    Number of Visits  8    Date for PT Re-Evaluation  03/16/18    Authorization Type  [employer] Medicare $40 co-pay; visits based on medical necessity, calendar year benefits, PT/SLP combined, no auth.    Authorization Time Period  cert 65/03/54-65/68/1275    Authorization - Visit Number  2    Authorization - Number of Visits  10    PT Start Time  1700    PT Stop Time  1426    PT Time Calculation (min)  38 min    Activity Tolerance  Patient tolerated treatment well;Patient limited by pain;No increased pain   Limited by LBP   Behavior During Therapy  [facility] for tasks assessed/performed       Past Medical History:  Diagnosis Date  . Abnormal EKG    negative stress nuclear-2005; probable inferolateral scarring in 2007- negative EKG at 10 mets   . Bilateral sciatica   . Chronic back pain   . Chronic hip pain, bilateral   . Diabetes mellitus    AODM- no insulin- fasting cbg 100-150  . 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 GERD (gastroesophageal reflux disease)   . H/O hiatal hernia   . HLD (hyperlipidemia)   . HTN (hypertension)   . Hx of dizziness   . Neuropathy   . Obesity   . Paresthesia of foot, bilateral   . Shortness of breath    exertion  . Tobacco abuse    70 pack years    Past Surgical History:  Procedure Laterality Date  . BIOPSY  07/11/2015   Procedure: BIOPSY;  Surgeon: Danie Binder, MD;  [facility];  Service: Endoscopy;;  . CATARACT EXTRACTION  2011-12   Bilateral  . COLONOSCOPY     At [facility]: polypectomy x 3 via resection, all with fragments of tubular  adenoma.  . COLONOSCOPY WITH PROPOFOL N/A 07/11/2015   Procedure: COLONOSCOPY WITH PROPOFOL;  Surgeon: Danie Binder, MD;  [facility];  Service: Endoscopy;  Laterality: N/A;  200 - per office, pt can't come earlier  . HERNIA REPAIR Bilateral 1997  . JOINT REPLACEMENT     bilateral hip   . LUMBAR SPINE SURGERY  2004   Laminectomy and fusion  . POLYPECTOMY  07/11/2015   Procedure: POLYPECTOMY biopsy multiple;  Surgeon: Danie Binder, MD;  [facility];  Service: Endoscopy;;  ceacl polyp APC used  . TOTAL HIP ARTHROPLASTY  2005   Left    There were no vitals filed for this visit.  Subjective Assessment - 02/18/18 1356    Subjective  Pt stated he has no neck pain unless he moves head and feels some stiffness.  Reports he has began the HEP and feels improvements.      Diagnostic tests  MRI - multilevel spondylosis and some canal stenosi particularly at C3-4 level w/ AP diamter of 7-8 mm of spinal canal. Does not appear to be significant cord compression. Appears to be cervical facet arthropathy at multiple levels.     Patient Stated Goals  Get rid of pain in neck.     Currently in Pain?  No/denies                       [facility] Adult PT Treatment/Exercise - 02/18/18 0001      Exercises   Exercises  Neck      Neck Exercises: Theraband   Shoulder Extension  10 reps;Red    Rows  10 reps;Red   2 sets     Neck Exercises: Seated   Neck Retraction  10 reps;3 secs    Neck Retraction Limitations  cueing for mechanics    Cervical Rotation  10 reps    Cervical Rotation Limitations  3D cervical excursion    W Back  10 reps    W Back Limitations  3" holds    Shoulder Rolls  Backwards;10 reps    Postural Training  Educated proper posture    Other Seated Exercise  scapula retraction             PT Education - 02/18/18 1400    Education Details  Reviewed evaluation findings, goals and copy of eval given to pt.  Reviewed HEP complaince and assured  proper form     Person(s) Educated  Patient    Methods  Explanation;Demonstration;Handout;Verbal cues    Comprehension  Verbalized understanding;Returned demonstration       PT Short Term Goals - 02/16/18 1916      PT SHORT TERM GOAL #1   Title  Patient will be independent in initial HEP and perform on a regular basis.     Time  2    Period  Weeks    Status  New    Target Date  03/02/18      PT SHORT TERM GOAL #2   Title  Patient will report no > than 3/10 pain in neck with cervical spine movements.    Baseline  initial - 4/10 day of eval    Time  2    Period  Weeks    Status  New        PT Long Term Goals - 02/16/18 1921      PT LONG TERM GOAL #1   Title  Patient will be independent in performing an advanced HEP and perform on a regular basis to maintain ROM and control pain.     Time  4    Period  Weeks    Status  New      PT LONG TERM GOAL #2   Title  Patient will exhibit a 10% or > improvement on FOTO score indicating an improvement in perceived functional abilities.     Baseline  initial - 37% limited    Time  4    Period  Weeks    Status  New      PT LONG TERM GOAL #3   Title  Patient will report being able to drive functionally without complaints of neck pain.     Time  4    Period  Weeks    Status  New            Plan - 02/18/18 1402    Clinical Impression Statement  Reviewed findings of eval and goals with copy of eval given to pt.  Assessed complaince with HEP with min cueing to improve cervical retraction.  Educated importance of proper posture can affect neck pain.  Therex focus on cervical mobility and postural strengthening.  No reports of  neck pain through session, was limited by LBP with fatigue with posture exercises.  Offered supine exercises though pt declined.  EOS pt was limited by fatigue, no reports of neck pain.      Rehab Potential  Fair    Clinical Impairments Affecting Rehab Potential  positive - positive effect of steriods to  decrease his pain; negative - significant medical history involving low back with 3 prior lumbar surgeries    PT Frequency  2x / week    PT Duration  4 weeks    PT Treatment/Interventions  ADLs/Self Care Home Management;Aquatic Therapy;Traction;Therapeutic activities;Therapeutic exercise;Neuromuscular re-education;Patient/family education;Manual techniques;Passive range of motion;Dry needling;Energy conservation;Spinal Manipulations;Joint Manipulations    PT Next Visit Plan  assess c-spine joint mobility, initiate postural strengthening exercises for scapular and cervical muscles; consider supine and seated exercises for patient's low back pain    PT Home Exercise Plan  initial - seated chin tuck/head lift       Patient will benefit from skilled therapeutic intervention in order to improve the following deficits and impairments:  Pain, Postural dysfunction, Decreased activity tolerance, Decreased range of motion, Decreased strength, Impaired UE functional use  Visit Diagnosis: Cervicalgia  Muscle weakness (generalized)  Abnormal posture  Other symptoms and signs involving the musculoskeletal system     Problem List Patient Active Problem List   Diagnosis Date Noted  . Diarrhea 01/26/2016  . Nausea with vomiting 01/26/2016  . History of adenomatous polyp of colon 01/26/2016  . Encounter for screening colonoscopy 07/06/2015  . Spondylolisthesis of lumbar region 08/17/2012  . Hypokalemia 12/17/2010  . DJD (degenerative joint disease)   . Diabetes mellitus type II 01/25/2010  . Hyperlipidemia 01/25/2010  . Tobacco abuse 01/25/2010  . Hypertension 05/29/2009   Ihor Austin, [place]; [place]  Aldona Lento 02/18/2018, 2:23 PM  [facility] [address] [place], [place], [address] Phone: [phone]   Fax:  [phone]  Name: LADARIUS SEUBERT MRN: [id number] Date of Birth: 28-May-1950

## 2018-02-23 ENCOUNTER — Ambulatory Visit (HOSPITAL_COMMUNITY): Payer: Medicare Other | Admitting: Physical Therapy

## 2018-02-23 DIAGNOSIS — M542 Cervicalgia: Secondary | ICD-10-CM

## 2018-02-23 DIAGNOSIS — M6281 Muscle weakness (generalized): Secondary | ICD-10-CM

## 2018-02-23 DIAGNOSIS — R293 Abnormal posture: Secondary | ICD-10-CM

## 2018-02-23 DIAGNOSIS — R29898 Other symptoms and signs involving the musculoskeletal system: Secondary | ICD-10-CM

## 2018-02-23 NOTE — Therapy (Signed)
[facility] [address] [place], [place], [address] Phone: [phone]   Fax:  [phone]  Physical Therapy Treatment  Patient Details  Name: Sean [name] MRN: [id number] Date of Birth: 15-Oct-1950 Referring Provider (PT): Erline Levine, MD   Encounter Date: 02/23/2018  PT End of Session - 02/23/18 1702    Visit Number  3    Number of Visits  8    Date for PT Re-Evaluation  03/16/18    Authorization Type  [employer] Medicare $40 co-pay; visits based on medical necessity, calendar year benefits, PT/SLP combined, no auth.    Authorization Time Period  cert 15/94/58-59/29/2446    Authorization - Visit Number  3    Authorization - Number of Visits  10    PT Start Time  2863    PT Stop Time  1515    PT Time Calculation (min)  38 min    Activity Tolerance  Patient tolerated treatment well;Patient limited by pain;No increased pain   Limited by LBP   Behavior During Therapy  [facility] for tasks assessed/performed       Past Medical History:  Diagnosis Date  . Abnormal EKG    negative stress nuclear-2005; probable inferolateral scarring in 2007- negative EKG at 10 mets   . Bilateral sciatica   . Chronic back pain   . Chronic hip pain, bilateral   . Diabetes mellitus    AODM- no insulin- fasting cbg 100-150  . 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 GERD (gastroesophageal reflux disease)   . H/O hiatal hernia   . HLD (hyperlipidemia)   . HTN (hypertension)   . Hx of dizziness   . Neuropathy   . Obesity   . Paresthesia of foot, bilateral   . Shortness of breath    exertion  . Tobacco abuse    70 pack years    Past Surgical History:  Procedure Laterality Date  . BIOPSY  07/11/2015   Procedure: BIOPSY;  Surgeon: Danie Binder, MD;  [facility];  Service: Endoscopy;;  . CATARACT EXTRACTION  2011-12   Bilateral  . COLONOSCOPY     At [facility]: polypectomy x 3 via resection, all with fragments of tubular  adenoma.  . COLONOSCOPY WITH PROPOFOL N/A 07/11/2015   Procedure: COLONOSCOPY WITH PROPOFOL;  Surgeon: Danie Binder, MD;  [facility];  Service: Endoscopy;  Laterality: N/A;  200 - per office, pt can't come earlier  . HERNIA REPAIR Bilateral 1997  . JOINT REPLACEMENT     bilateral hip   . LUMBAR SPINE SURGERY  2004   Laminectomy and fusion  . POLYPECTOMY  07/11/2015   Procedure: POLYPECTOMY biopsy multiple;  Surgeon: Danie Binder, MD;  [facility];  Service: Endoscopy;;  ceacl polyp APC used  . TOTAL HIP ARTHROPLASTY  2005   Left    There were no vitals filed for this visit.  Subjective Assessment - 02/23/18 1450    Subjective  Pt states it only hurts a little, mostly with head movement.  alot better than it was.     Currently in Pain?  Yes    Pain Score  3     Pain Location  Neck    Pain Orientation  Right;Left    Pain Descriptors / Indicators  Aching    Pain Type  Chronic pain    Aggravating Factors   mostly right side pain now, was bilaterally.  [facility] Adult PT Treatment/Exercise - 02/23/18 0001      Neck Exercises: Theraband   Scapula Retraction  10 reps;Red    Shoulder Extension  10 reps;Red    Rows  10 reps;Red      Neck Exercises: Seated   Neck Retraction  10 reps;3 secs    Neck Retraction Limitations  cueing for mechanics    Cervical Rotation  10 reps    Cervical Rotation Limitations  3D cervical excursion    W Back  10 reps    W Back Limitations  3" holds    Shoulder Rolls  Backwards;10 reps    Shoulder Flexion  Both;10 reps    Shoulder ABduction  Both;10 reps      Manual Therapy   Manual Therapy  Soft tissue mobilization    Manual therapy comments  completed seperately from all other skilled interventions    Soft tissue mobilization  bilateral UT, scalenes       Neck Exercises: Stretches   Corner Stretch Limitations  3X20" holds               PT Short Term Goals - 02/16/18 1916       PT SHORT TERM GOAL #1   Title  Patient will be independent in initial HEP and perform on a regular basis.     Time  2    Period  Weeks    Status  New    Target Date  03/02/18      PT SHORT TERM GOAL #2   Title  Patient will report no > than 3/10 pain in neck with cervical spine movements.    Baseline  initial - 4/10 day of eval    Time  2    Period  Weeks    Status  New        PT Long Term Goals - 02/16/18 1921      PT LONG TERM GOAL #1   Title  Patient will be independent in performing an advanced HEP and perform on a regular basis to maintain ROM and control pain.     Time  4    Period  Weeks    Status  New      PT LONG TERM GOAL #2   Title  Patient will exhibit a 10% or > improvement on FOTO score indicating an improvement in perceived functional abilities.     Baseline  initial - 37% limited    Time  4    Period  Weeks    Status  New      PT LONG TERM GOAL #3   Title  Patient will report being able to drive functionally without complaints of neck pain.     Time  4    Period  Weeks    Status  New            Plan - 02/23/18 1703    Clinical Impression Statement  contineud progresion with addition of scap retraction using theraband and corner stretch to address tight chest mm.  Began manual to tight upper traps and cervical paraspinals with noted reduction in symptoms.  Spasms most in bilateral UT, howver tightness througout.  PT with cues to reduce levator substitition with most therex this session.      PT Next Visit Plan  continued to improve postural strength scapular and cervical muscles.        Patient will benefit from skilled therapeutic intervention in order to improve the following deficits  and impairments:     Visit Diagnosis: Cervicalgia  Muscle weakness (generalized)  Abnormal posture  Other symptoms and signs involving the musculoskeletal system     Problem List Patient Active Problem List   Diagnosis Date Noted  . Diarrhea  01/26/2016  . Nausea with vomiting 01/26/2016  . History of adenomatous polyp of colon 01/26/2016  . Encounter for screening colonoscopy 07/06/2015  . Spondylolisthesis of lumbar region 08/17/2012  . Hypokalemia 12/17/2010  . DJD (degenerative joint disease)   . Diabetes mellitus type II 01/25/2010  . Hyperlipidemia 01/25/2010  . Tobacco abuse 01/25/2010  . Hypertension 05/29/2009   Teena Irani, PTA/CLT [phone]  Teena Irani 02/23/2018, 5:08 PM  [facility] [address] [place], [place], [address] Phone: [phone]   Fax:  [phone]  Name: NATHANEL TALLMAN MRN: [id number] Date of Birth: 1950-11-03

## 2018-02-25 ENCOUNTER — Encounter (HOSPITAL_COMMUNITY): Payer: Self-pay | Admitting: Physical Therapy

## 2018-02-25 ENCOUNTER — Ambulatory Visit (HOSPITAL_COMMUNITY): Payer: Medicare Other | Admitting: Physical Therapy

## 2018-02-25 DIAGNOSIS — R293 Abnormal posture: Secondary | ICD-10-CM

## 2018-02-25 DIAGNOSIS — M6281 Muscle weakness (generalized): Secondary | ICD-10-CM

## 2018-02-25 DIAGNOSIS — M542 Cervicalgia: Secondary | ICD-10-CM

## 2018-02-25 DIAGNOSIS — R29898 Other symptoms and signs involving the musculoskeletal system: Secondary | ICD-10-CM

## 2018-02-25 NOTE — Therapy (Signed)
[facility] [address] [place], [place], [address] Phone: [phone]   Fax:  [phone]  Physical Therapy Treatment  Patient Details  Name: Sean [name] MRN: [id number] Date of Birth: [birth year] Referring Provider (PT): Erline Levine, MD   Encounter Date: 02/25/2018  PT End of Session - 02/25/18 1600    Visit Number  4    Number of Visits  8    Date for PT Re-Evaluation  03/16/18    Authorization Type  [employer] Medicare $40 co-pay; visits based on medical necessity, calendar year benefits, PT/SLP combined, no auth.    Authorization Time Period  cert 60/63/01-60/02/9322    Authorization - Visit Number  4    Authorization - Number of Visits  10    PT Start Time  1520    PT Stop Time  1559    PT Time Calculation (min)  39 min    Activity Tolerance  Patient tolerated treatment well;Patient limited by pain;No increased pain   Limited by LBP   Behavior During Therapy  [facility] for tasks assessed/performed       Past Medical History:  Diagnosis Date  . Abnormal EKG    negative stress nuclear-2005; probable inferolateral scarring in 2007- negative EKG at 10 mets   . Bilateral sciatica   . Chronic back pain   . Chronic hip pain, bilateral   . Diabetes mellitus    AODM- no insulin- fasting cbg 100-150  . 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 GERD (gastroesophageal reflux disease)   . H/O hiatal hernia   . HLD (hyperlipidemia)   . HTN (hypertension)   . Hx of dizziness   . Neuropathy   . Obesity   . Paresthesia of foot, bilateral   . Shortness of breath    exertion  . Tobacco abuse    70 pack years    Past Surgical History:  Procedure Laterality Date  . BIOPSY  07/11/2015   Procedure: BIOPSY;  Surgeon: Danie Binder, MD;  [facility];  Service: Endoscopy;;  . CATARACT EXTRACTION  2011-12   Bilateral  . COLONOSCOPY     At [facility]: polypectomy x 3 via resection, all with fragments of tubular  adenoma.  . COLONOSCOPY WITH PROPOFOL N/A 07/11/2015   Procedure: COLONOSCOPY WITH PROPOFOL;  Surgeon: Danie Binder, MD;  [facility];  Service: Endoscopy;  Laterality: N/A;  200 - per office, pt can't come earlier  . HERNIA REPAIR Bilateral 1997  . JOINT REPLACEMENT     bilateral hip   . LUMBAR SPINE SURGERY  2004   Laminectomy and fusion  . POLYPECTOMY  07/11/2015   Procedure: POLYPECTOMY biopsy multiple;  Surgeon: Danie Binder, MD;  [facility];  Service: Endoscopy;;  ceacl polyp APC used  . TOTAL HIP ARTHROPLASTY  2005   Left    There were no vitals filed for this visit.  Subjective Assessment - 02/25/18 1522    Subjective  Pt states his neck is just stiff at end range.   HE has no questions on his HEP.     Pertinent History  LBP w/ radiculopathy, bilateral THA, hernia repair, HLD, Arthritis, Back pain, BMI over 30, Diabetes Type I or II, High Blood Pressure, Incontinence,  Prior Surgery, Prosthesis / Implants,    Visual Impairment     Limitations  Sitting;Lifting    How long can you sit comfortably?  15-30 minutes  gets stiff; lifting a heavier object overhead    Diagnostic tests  MRI - multilevel spondylosis and some canal stenosi particularly at C3-4 level w/ AP diamter of 7-8 mm of spinal canal. Does not appear to be significant cord compression. Appears to be cervical facet arthropathy at multiple levels.     Patient Stated Goals  Get rid of pain in neck.     Currently in Pain?  No/denies    Pain Onset  More than a month ago              [facility] Adult PT Treatment/Exercise - 02/25/18 0001      Exercises   Exercises  Neck      Neck Exercises: Theraband   Scapula Retraction  10 reps;Red    Scapula Retraction Limitations  given for HEP    Shoulder Extension  10 reps;Red    Shoulder Extension Limitations  given for HEP    Rows  10 reps;Red    Rows Limitations  given for HEP       Neck Exercises: Seated   Neck Retraction  10 reps;3 secs     Neck Retraction Limitations  cueing for mechanics    Cervical Rotation Limitations  3D cervical excursion   x3   W Back  10 reps    W Back Weights (lbs)  2    W Back Limitations  3" holds    Shoulder Shrugs  10 reps    Shoulder Shrugs Limitations  up down and relax     Shoulder Rolls  Backwards;10 reps    Shoulder Flexion  Both;10 reps    Shoulder Flexion Weights (lbs)  1    Shoulder ABduction  Both;10 reps    Shoulder Abduction Weights (lbs)  1 for 5 0 for 5       Neck Exercises: Supine   Cervical Isometrics  Extension;Right lateral flexion;Left lateral flexion;3 secs;10 reps      Manual Therapy   Manual Therapy  Soft tissue mobilization    Manual therapy comments  completed seperately from all other skilled interventions    Soft tissue mobilization  bilateral UT, scalenes                       PT Education - 02/25/18 1559    Education Details  Added postural t-band exercises to pt HEP    Person(s) Educated  Patient    Methods  Explanation;Handout;Demonstration    Comprehension  Verbalized understanding;Returned demonstration       PT Short Term Goals - 02/25/18 1603      PT SHORT TERM GOAL #1   Title  Patient will be independent in initial HEP and perform on a regular basis.     Time  2    Period  Weeks    Status  Achieved      PT SHORT TERM GOAL #2   Title  Patient will report no > than 3/10 pain in neck with cervical spine movements.    Baseline  initial - 4/10 day of eval    Time  2    Period  Weeks    Status  Achieved        PT Long Term Goals - 02/25/18 1604      PT LONG TERM GOAL #1   Title  Patient will be independent in performing an advanced HEP and perform on a regular basis to maintain ROM and control pain.     Time  4    Period  Weeks    Status  On-going      PT LONG TERM GOAL #2   Title  Patient will exhibit a 10% or > improvement on FOTO score indicating an improvement in perceived functional abilities.     Baseline  initial - 37%  limited    Time  4    Period  Weeks    Status  On-going      PT LONG TERM GOAL #3   Title  Patient will report being able to drive functionally without complaints of neck pain.     Time  4    Period  Weeks    Status  Partially Met            Plan - 02/25/18 1600    Clinical Impression Statement  Pt with functional ROM of cervical spine at this time.  Therapist added exercises per flowsheet as well as adding wt to shoulder andw back exercises.  Due to Lt shoulder having chronic pain needed to decrease shoulder wt from 2 to 1 pound.  Manual with only one small spasm of moderate intensity on RT upper trap area which was decreased but not abolished.     Rehab Potential  Fair    Clinical Impairments Affecting Rehab Potential  positive - positive effect of steriods to decrease his pain; negative - significant medical history involving low back with 3 prior lumbar surgeries    PT Frequency  2x / week    PT Duration  4 weeks    PT Treatment/Interventions  ADLs/Self Care Home Management;Aquatic Therapy;Traction;Therapeutic activities;Therapeutic exercise;Neuromuscular re-education;Patient/family education;Manual techniques;Passive range of motion;Dry needling;Energy conservation;Spinal Manipulations;Joint Manipulations    PT Next Visit Plan  continue with cervical and scapular strengthening begin sidelying cervical sidebends.     PT Home Exercise Plan  initial - seated chin tuck/head lift    Consulted and Agree with Plan of Care  Patient       Patient will benefit from skilled therapeutic intervention in order to improve the following deficits and impairments:  Pain, Postural dysfunction, Decreased activity tolerance, Decreased range of motion, Decreased strength, Impaired UE functional use  Visit Diagnosis: Cervicalgia  Muscle weakness (generalized)  Abnormal posture  Other symptoms and signs involving the musculoskeletal system     Problem List Patient Active Problem List    Diagnosis Date Noted  . Diarrhea 01/26/2016  . Nausea with vomiting 01/26/2016  . History of adenomatous polyp of colon 01/26/2016  . Encounter for screening colonoscopy 07/06/2015  . Spondylolisthesis of lumbar region 08/17/2012  . Hypokalemia 12/17/2010  . DJD (degenerative joint disease)   . Diabetes mellitus type II 01/25/2010  . Hyperlipidemia 01/25/2010  . Tobacco abuse 01/25/2010  . Hypertension 05/29/2009    Rayetta Humphrey, PT CLT [phone] 02/25/2018, 4:04 PM  [facility] [address] [place], [place], [address] Phone: [phone]   Fax:  [phone]  Name: Sean [name] MRN: [id number] Date of Birth: 07/01/1950

## 2018-03-02 ENCOUNTER — Ambulatory Visit (HOSPITAL_COMMUNITY): Payer: Medicare Other | Admitting: Physical Therapy

## 2018-03-02 DIAGNOSIS — M542 Cervicalgia: Secondary | ICD-10-CM | POA: Diagnosis not present

## 2018-03-02 DIAGNOSIS — R293 Abnormal posture: Secondary | ICD-10-CM

## 2018-03-02 DIAGNOSIS — R29898 Other symptoms and signs involving the musculoskeletal system: Secondary | ICD-10-CM

## 2018-03-02 DIAGNOSIS — M6281 Muscle weakness (generalized): Secondary | ICD-10-CM

## 2018-03-02 NOTE — Therapy (Signed)
[place], [place], [address] Phone: [phone]   Fax:  [phone]  Physical Therapy Treatment  Patient Details  Name: Sean [name] MRN: [id number] Date of Birth: 1950-09-16 Referring Provider (PT): Erline Levine, MD   Encounter Date: 03/02/2018    Past Medical History:  Diagnosis Date  . Abnormal EKG    negative stress nuclear-2005; probable inferolateral scarring in 2007- negative EKG at 10 mets   . Bilateral sciatica   . Chronic back pain   . Chronic hip pain, bilateral   . Diabetes mellitus    AODM- no insulin- fasting cbg 100-150  . 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 GERD (gastroesophageal reflux disease)   . H/O hiatal hernia   . HLD (hyperlipidemia)   . HTN (hypertension)   . Hx of dizziness   . Neuropathy   . Obesity   . Paresthesia of foot, bilateral   . Shortness of breath    exertion  . Tobacco abuse    70 pack years    Past Surgical History:  Procedure Laterality Date  . BIOPSY  07/11/2015   Procedure: BIOPSY;  Surgeon: Danie Binder, MD;  [facility];  Service: Endoscopy;;  . CATARACT EXTRACTION  2011-12   Bilateral  . COLONOSCOPY     At [facility]: polypectomy x 3 via resection, all with fragments of tubular adenoma.  . COLONOSCOPY WITH PROPOFOL N/A 07/11/2015   Procedure: COLONOSCOPY WITH PROPOFOL;  Surgeon: Danie Binder, MD;  [facility];  Service: Endoscopy;  Laterality: N/A;  200 - per office, pt can't come earlier  . HERNIA REPAIR Bilateral 1997  . JOINT REPLACEMENT     bilateral hip   . LUMBAR SPINE SURGERY  2004   Laminectomy and fusion  . POLYPECTOMY  07/11/2015   Procedure: POLYPECTOMY biopsy multiple;  Surgeon: Danie Binder, MD;  [facility];  Service: Endoscopy;;  ceacl polyp APC used  . TOTAL HIP ARTHROPLASTY  2005   Left    There were no vitals filed for this visit.  Subjective Assessment -  03/02/18 1605    Subjective  Pt states his neck feels much better overall.  Currently without pain, just stiffness.    Currently in Pain?  No/denies                       [facility] Adult PT Treatment/Exercise - 03/02/18 0001      Neck Exercises: Machines for Strengthening   UBE (Upper Arm Bike)  4 minute backward level 1      Neck Exercises: Seated   Neck Retraction  10 reps;3 secs    Neck Retraction Limitations  cueing for mechanics    W Back  10 reps    W Back Weights (lbs)  2    W Back Limitations  3" holds    Shoulder Shrugs  10 reps    Shoulder Shrugs Limitations  up down and relax     Shoulder Rolls  Backwards;10 reps    Shoulder Flexion  Both;10 reps    Shoulder Flexion Weights (lbs)  1    Shoulder ABduction  Both;10 reps    Shoulder Abduction Weights (lbs)  1      Neck Exercises: Supine   Neck Retraction  10 reps      Neck Exercises: Sidelying   Lateral Flexion  Both;10 reps  Other Sidelying Exercise  UE abduction 10 reps each      Manual Therapy   Manual Therapy  Soft tissue mobilization    Manual therapy comments  completed seperately from all other skilled interventions    Soft tissue mobilization  bilateral UT, scalenes                PT Short Term Goals - 02/25/18 1603      PT SHORT TERM GOAL #1   Title  Patient will be independent in initial HEP and perform on a regular basis.     Time  2    Period  Weeks    Status  Achieved      PT SHORT TERM GOAL #2   Title  Patient will report no > than 3/10 pain in neck with cervical spine movements.    Baseline  initial - 4/10 day of eval    Time  2    Period  Weeks    Status  Achieved        PT Long Term Goals - 02/25/18 1604      PT LONG TERM GOAL #1   Title  Patient will be independent in performing an advanced HEP and perform on a regular basis to maintain ROM and control pain.     Time  4    Period  Weeks    Status  On-going      PT LONG TERM GOAL #2   Title  Patient will  exhibit a 10% or > improvement on FOTO score indicating an improvement in perceived functional abilities.     Baseline  initial - 37% limited    Time  4    Period  Weeks    Status  On-going      PT LONG TERM GOAL #3   Title  Patient will report being able to drive functionally without complaints of neck pain.     Time  4    Period  Weeks    Status  Partially Met            Plan - 03/02/18 1656    Clinical Impression Statement  Warmed up on UBE today prior to beginning therex.  Pt with improving symptoms in cervical region.  Began sidelying lateral flexion today with good results/ROM achieved.  Pt able to complete full set of 10 reps using 1# weight with abduction.  contineud tightness/spasms in UT, Rt>Lt.  Able to reduce with manual techyniques.     Rehab Potential  Fair    Clinical Impairments Affecting Rehab Potential  positive - positive effect of steriods to decrease his pain; negative - significant medical history involving low back with 3 prior lumbar surgeries    PT Frequency  2x / week    PT Duration  4 weeks    PT Treatment/Interventions  ADLs/Self Care Home Management;Aquatic Therapy;Traction;Therapeutic activities;Therapeutic exercise;Neuromuscular re-education;Patient/family education;Manual techniques;Passive range of motion;Dry needling;Energy conservation;Spinal Manipulations;Joint Manipulations    PT Next Visit Plan  continue with cervical and scapular strengthening.  Attempt prone exercises and add chest stretch.    PT Home Exercise Plan  initial - seated chin tuck/head lift    Consulted and Agree with Plan of Care  Patient       Patient will benefit from skilled therapeutic intervention in order to improve the following deficits and impairments:  Pain, Postural dysfunction, Decreased activity tolerance, Decreased range of motion, Decreased strength, Impaired UE functional use  Visit Diagnosis: Cervicalgia  Muscle weakness (  generalized)  Abnormal  posture  Other symptoms and signs involving the musculoskeletal system     Problem List Patient Active Problem List   Diagnosis Date Noted  . Diarrhea 01/26/2016  . Nausea with vomiting 01/26/2016  . History of adenomatous polyp of colon 01/26/2016  . Encounter for screening colonoscopy 07/06/2015  . Spondylolisthesis of lumbar region 08/17/2012  . Hypokalemia 12/17/2010  . DJD (degenerative joint disease)   . Diabetes mellitus type II 01/25/2010  . Hyperlipidemia 01/25/2010  . Tobacco abuse 01/25/2010  . Hypertension 05/29/2009    Teena Irani, PTA/CLT [phone]   Teena Irani 03/02/2018, 4:58 PM  [place] [address] [place], [place], [address] Phone: [phone]   Fax:  [phone]  Name: Sean [name] MRN: [id number] Date of Birth: 1950/09/28

## 2018-03-05 ENCOUNTER — Ambulatory Visit (HOSPITAL_COMMUNITY): Payer: Medicare Other

## 2018-03-05 DIAGNOSIS — M6281 Muscle weakness (generalized): Secondary | ICD-10-CM

## 2018-03-05 DIAGNOSIS — R29898 Other symptoms and signs involving the musculoskeletal system: Secondary | ICD-10-CM

## 2018-03-05 DIAGNOSIS — R293 Abnormal posture: Secondary | ICD-10-CM

## 2018-03-05 DIAGNOSIS — M542 Cervicalgia: Secondary | ICD-10-CM

## 2018-03-05 NOTE — Therapy (Signed)
[facility] [address] [place], [place], [address] Phone: [phone]   Fax:  [phone]  Physical Therapy Treatment/Re-Evaluation/Discharge Summary  Patient Details  Name: Sean [name] MRN: [id number] Date of Birth: 1950/07/06 Referring Provider ([name]): Erline Levine, MD  PHYSICAL THERAPY DISCHARGE SUMMARY  Visits from Start of Care: 5 of 8   Plan: Patient agrees to discharge.  Patient goals were not met. Patient is being discharged due to being pleased with the current functional level.  ?????       Encounter Date: 03/05/2018  [name] End of Session - 03/05/18 1358    Visit Number  5    Number of Visits  8    Date for [name] Re-Evaluation  03/16/18    Authorization Type  [employer] Medicare $40 co-pay; visits based on medical necessity, calendar year benefits, [name]/SLP combined, no auth.    Authorization Time Period  cert 66/44/03-47/42/5956    Authorization - Visit Number  5    Authorization - Number of Visits  10    [name] Start Time  1400    [name] Stop Time  1430    [name] Time Calculation (min)  30 min    Activity Tolerance  Patient tolerated treatment well;Patient limited by pain;No increased pain   Limited by LBP   Behavior During Therapy  [facility] for tasks assessed/performed       Past Medical History:  Diagnosis Date  . Abnormal EKG    negative stress nuclear-2005; probable inferolateral scarring in 2007- negative EKG at 10 mets   . Bilateral sciatica   . Chronic back pain   . Chronic hip pain, bilateral   . Diabetes mellitus    AODM- no insulin- fasting cbg 100-150  . 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 GERD (gastroesophageal reflux disease)   . H/O hiatal hernia   . HLD (hyperlipidemia)   . HTN (hypertension)   . Hx of dizziness   . Neuropathy   . Obesity   . Paresthesia of foot, bilateral   . Shortness of breath    exertion  . Tobacco abuse    70 pack years    Past Surgical History:  Procedure  Laterality Date  . BIOPSY  07/11/2015   Procedure: BIOPSY;  Surgeon: Danie Binder, MD;  [facility];  Service: Endoscopy;;  . CATARACT EXTRACTION  2011-12   Bilateral  . COLONOSCOPY     At [facility]: polypectomy x 3 via resection, all with fragments of tubular adenoma.  . COLONOSCOPY WITH PROPOFOL N/A 07/11/2015   Procedure: COLONOSCOPY WITH PROPOFOL;  Surgeon: Danie Binder, MD;  [facility];  Service: Endoscopy;  Laterality: N/A;  200 - per office, [name] can't come earlier  . HERNIA REPAIR Bilateral 1997  . JOINT REPLACEMENT     bilateral hip   . LUMBAR SPINE SURGERY  2004   Laminectomy and fusion  . POLYPECTOMY  07/11/2015   Procedure: POLYPECTOMY biopsy multiple;  Surgeon: Danie Binder, MD;  [facility];  Service: Endoscopy;;  ceacl polyp APC used  . TOTAL HIP ARTHROPLASTY  2005   Left    There were no vitals filed for this visit.  Subjective Assessment - 03/05/18 1358    Subjective  [name] states his neck feels much better overall.  Currently without pain, no stiffness. Was very sore after last session and just recovered today.     Currently in Pain?  No/denies         Sean [name] Assessment - 03/05/18 0001      AROM   Cervical Flexion  WNL    Cervical Extension  WNL   pain at end range   Cervical - Right Side Bend  WNL   right pain at end range   Cervical - Left Side Bend  WNL   right pain at end range   Cervical - Right Rotation  WNL   right pain at end range   Cervical - Left Rotation  WNL      Strength   Right Shoulder Flexion  4/5    Right Shoulder Extension  4+/5    Right Shoulder ABduction  4/5    Right Shoulder Internal Rotation  4+/5    Right Shoulder External Rotation  4/5    Left Shoulder Flexion  4/5    Left Shoulder Extension  4/5    Left Shoulder ABduction  4-/5    Left Shoulder Internal Rotation  4/5    Right Elbow Flexion  4+/5    Right Elbow Extension  4+/5    Left Elbow Flexion  4/5    Left Elbow Extension  4/5                            [name] Education - 03/05/18 1431    Education Details  Importance of HEP performance; connection between cervical spine and shoulder weakness    Person(s) Educated  Patient    Methods  Explanation    Comprehension  Verbalized understanding;Returned demonstration       [name] Short Term Goals - 03/05/18 1359      [name] SHORT TERM GOAL #1   Title  Patient will be independent in initial HEP and perform on a regular basis.     Time  2    Period  Weeks    Status  Partially Met      [name] SHORT TERM GOAL #2   Title  Patient will report no > than 3/10 pain in neck with cervical spine movements.    Baseline  initial - 4/10 day of eval    Time  2    Period  Weeks    Status  On-going        [name] Long Term Goals - 03/05/18 1359      [name] LONG TERM GOAL #1   Title  Patient will be independent in performing an advanced HEP and perform on a regular basis to maintain ROM and control pain.     Time  4    Period  Weeks    Status  On-going      [name] LONG TERM GOAL #2   Title  Patient will exhibit a 10% or > improvement on FOTO score indicating an improvement in perceived functional abilities.     Baseline  initial - 37% limited; 03/05/2018 - 4% limited    Time  4    Period  Weeks    Status  Achieved      [name] LONG TERM GOAL #3   Title  Patient will report being able to drive functionally without complaints of neck pain.     Baseline  03/05/2018 - no problems driving now    Time  4    Period  Weeks    Status  Achieved            Plan - 03/05/18 1359  Clinical Impression Statement  Patient progress re-assessed today. Patient reported increased pain after last treatment session. Upon further questioning, likely musculoskeletal soreness from therapeutic exercises. Patient reported he had been performing a few of the exercises given at the clinic but probably wouldn't be doing any more as his neck feels much better. Patient's perceived functional  abilities score on the FOTO improved from 37% limited to 4% limited. Patient's C-spine AROM increased to WNL for all directions. Patient continues to exhibit UE weakness, left > right and postural impairments that increase the stresses on the C-spine. Mild improvements in UE strength via MMT. Patient requesting DC from [name] at this time as he feels he has improved and doesn't need to continue. As patient's functional abilities have vastly improved, [name] in agreement. Patient would benefit from additional sessions to improve C-spine and bilateral shoulder strength, but patient ready to be DC'ed at this time. Thank you for the referral.      Rehab Potential  Fair    Clinical Impairments Affecting Rehab Potential  positive - positive effect of steriods to decrease his pain; negative - significant medical history involving low back with 3 prior lumbar surgeries    [name] Frequency  2x / week    [name] Duration  4 weeks    [name] Treatment/Interventions  ADLs/Self Care Home Management;Aquatic Therapy;Traction;Therapeutic activities;Therapeutic exercise;Neuromuscular re-education;Patient/family education;Manual techniques;Passive range of motion;Dry needling;Energy conservation;Spinal Manipulations;Joint Manipulations    [name] Next Visit Plan  --    [name] Home Exercise Plan  --    Consulted and Agree with Plan of Care  Patient       Patient will benefit from skilled therapeutic intervention in order to improve the following deficits and impairments:  Pain, Postural dysfunction, Decreased activity tolerance, Decreased range of motion, Decreased strength, Impaired UE functional use  Visit Diagnosis: Cervicalgia  Muscle weakness (generalized)  Abnormal posture  Other symptoms and signs involving the musculoskeletal system     Problem List Patient Active Problem List   Diagnosis Date Noted  . Diarrhea 01/26/2016  . Nausea with vomiting 01/26/2016  . History of adenomatous polyp of colon 01/26/2016  . Encounter for  screening colonoscopy 07/06/2015  . Spondylolisthesis of lumbar region 08/17/2012  . Hypokalemia 12/17/2010  . DJD (degenerative joint disease)   . Diabetes mellitus type II 01/25/2010  . Hyperlipidemia 01/25/2010  . Tobacco abuse 01/25/2010  . Hypertension 05/29/2009    Floria Raveling. Hartnett-Rands, MS, [name] [facility] #[id number] 03/05/2018, 2:40 PM  [facility] [address] [place], [place], [address] Phone: [phone]   Fax:  [phone]  Name: Sean [name] MRN: [id number] Date of Birth: 25-Dec-1950

## 2018-03-05 NOTE — Patient Instructions (Signed)
CHEST: Doorway, Unilateral - Standing    Standing in doorway, place one hand on wall with elbow bent at shoulder height. Lean forward. Hold 10___ seconds. _3__ reps per set, _1__ sets per day. All do stretch with arm down by your hip.   Copyright  V[employer]. All rights reserved.

## 2018-03-09 ENCOUNTER — Encounter (HOSPITAL_COMMUNITY): Payer: Medicare Other | Admitting: Physical Therapy

## 2018-03-11 ENCOUNTER — Encounter (HOSPITAL_COMMUNITY): Payer: Medicare Other

## 2018-09-17 ENCOUNTER — Emergency Department (HOSPITAL_COMMUNITY): Payer: Medicare Other

## 2018-09-17 ENCOUNTER — Other Ambulatory Visit: Payer: Self-pay

## 2018-09-17 ENCOUNTER — Inpatient Hospital Stay (HOSPITAL_COMMUNITY)
Admission: EM | Admit: 2018-09-17 | Discharge: 2018-09-21 | DRG: 641 | Disposition: A | Discharge status: Home Health | Payer: Medicare Other | Attending: Internal Medicine | Admitting: Internal Medicine

## 2018-09-17 ENCOUNTER — Encounter (HOSPITAL_COMMUNITY): Payer: Self-pay | Admitting: Emergency Medicine

## 2018-09-17 DIAGNOSIS — W19XXXA Unspecified fall, initial encounter: Secondary | ICD-10-CM | POA: Diagnosis present

## 2018-09-17 DIAGNOSIS — Z79899 Other long term (current) drug therapy: Secondary | ICD-10-CM

## 2018-09-17 DIAGNOSIS — K219 Gastro-esophageal reflux disease without esophagitis: Secondary | ICD-10-CM | POA: Diagnosis present

## 2018-09-17 DIAGNOSIS — Z7984 Long term (current) use of oral hypoglycemic drugs: Secondary | ICD-10-CM

## 2018-09-17 DIAGNOSIS — N400 Enlarged prostate without lower urinary tract symptoms: Secondary | ICD-10-CM

## 2018-09-17 DIAGNOSIS — Z23 Encounter for immunization: Secondary | ICD-10-CM

## 2018-09-17 DIAGNOSIS — E11649 Type 2 diabetes mellitus with hypoglycemia without coma: Secondary | ICD-10-CM | POA: Diagnosis present

## 2018-09-17 DIAGNOSIS — E119 Type 2 diabetes mellitus without complications: Secondary | ICD-10-CM

## 2018-09-17 DIAGNOSIS — R350 Frequency of micturition: Secondary | ICD-10-CM | POA: Diagnosis present

## 2018-09-17 DIAGNOSIS — E785 Hyperlipidemia, unspecified: Secondary | ICD-10-CM | POA: Diagnosis present

## 2018-09-17 DIAGNOSIS — I129 Hypertensive chronic kidney disease with stage 1 through stage 4 chronic kidney disease, or unspecified chronic kidney disease: Secondary | ICD-10-CM | POA: Diagnosis present

## 2018-09-17 DIAGNOSIS — E860 Dehydration: Principal | ICD-10-CM | POA: Diagnosis present

## 2018-09-17 DIAGNOSIS — M5431 Sciatica, right side: Secondary | ICD-10-CM | POA: Diagnosis present

## 2018-09-17 DIAGNOSIS — T502X5A Adverse effect of carbonic-anhydrase inhibitors, benzothiadiazides and other diuretics, initial encounter: Secondary | ICD-10-CM | POA: Diagnosis present

## 2018-09-17 DIAGNOSIS — G8929 Other chronic pain: Secondary | ICD-10-CM | POA: Diagnosis present

## 2018-09-17 DIAGNOSIS — M25552 Pain in left hip: Secondary | ICD-10-CM | POA: Diagnosis present

## 2018-09-17 DIAGNOSIS — R531 Weakness: Secondary | ICD-10-CM

## 2018-09-17 DIAGNOSIS — E876 Hypokalemia: Secondary | ICD-10-CM | POA: Diagnosis present

## 2018-09-17 DIAGNOSIS — F17210 Nicotine dependence, cigarettes, uncomplicated: Secondary | ICD-10-CM | POA: Diagnosis present

## 2018-09-17 DIAGNOSIS — M25562 Pain in left knee: Secondary | ICD-10-CM | POA: Diagnosis present

## 2018-09-17 DIAGNOSIS — Z86010 Personal history of colonic polyps: Secondary | ICD-10-CM

## 2018-09-17 DIAGNOSIS — M25551 Pain in right hip: Secondary | ICD-10-CM | POA: Diagnosis present

## 2018-09-17 DIAGNOSIS — R5381 Other malaise: Secondary | ICD-10-CM

## 2018-09-17 DIAGNOSIS — N401 Enlarged prostate with lower urinary tract symptoms: Secondary | ICD-10-CM | POA: Diagnosis present

## 2018-09-17 DIAGNOSIS — N182 Chronic kidney disease, stage 2 (mild): Secondary | ICD-10-CM | POA: Diagnosis present

## 2018-09-17 DIAGNOSIS — N179 Acute kidney failure, unspecified: Secondary | ICD-10-CM | POA: Diagnosis present

## 2018-09-17 DIAGNOSIS — Z96643 Presence of artificial hip joint, bilateral: Secondary | ICD-10-CM | POA: Diagnosis present

## 2018-09-17 LAB — URINALYSIS, ROUTINE W REFLEX MICROSCOPIC
Glucose, UA: NEGATIVE mg/dL
Hgb urine dipstick: NEGATIVE
Ketones, ur: 5 mg/dL — AB
Nitrite: NEGATIVE
Protein, ur: NEGATIVE mg/dL
Specific Gravity, Urine: 1.018 (ref 1.005–1.030)
pH: 5 (ref 5.0–8.0)

## 2018-09-17 LAB — COMPREHENSIVE METABOLIC PANEL
ALT: 20 U/L (ref 0–44)
AST: 21 U/L (ref 15–41)
Albumin: 3.5 g/dL (ref 3.5–5.0)
Alkaline Phosphatase: 80 U/L (ref 38–126)
Anion gap: 15 (ref 5–15)
BUN: 26 mg/dL — ABNORMAL HIGH (ref 8–23)
CO2: 30 mmol/L (ref 22–32)
Calcium: 14.3 mg/dL (ref 8.9–10.3)
Chloride: 91 mmol/L — ABNORMAL LOW (ref 98–111)
Creatinine, Ser: 2.09 mg/dL — ABNORMAL HIGH (ref 0.61–1.24)
GFR calc Af Amer: 37 mL/min — ABNORMAL LOW (ref >60)
GFR calc non Af Amer: 32 mL/min — ABNORMAL LOW (ref >60)
Glucose, Bld: 88 mg/dL (ref 70–99)
Potassium: 2.6 mmol/L — CL (ref 3.5–5.1)
Sodium: 136 mmol/L (ref 135–145)
Total Bilirubin: 1.1 mg/dL (ref 0.3–1.2)
Total Protein: 7 g/dL (ref 6.5–8.1)

## 2018-09-17 LAB — CBC WITH DIFFERENTIAL/PLATELET
Abs Immature Granulocytes: 0.04 10*3/uL (ref 0.00–0.07)
Basophils Absolute: 0.1 10*3/uL (ref 0.0–0.1)
Basophils Relative: 1 %
Eosinophils Absolute: 0.4 10*3/uL (ref 0.0–0.5)
Eosinophils Relative: 4 %
HCT: 43.8 % (ref 39.0–52.0)
Hemoglobin: 13.9 g/dL (ref 13.0–17.0)
Immature Granulocytes: 0 %
Lymphocytes Relative: 11 %
Lymphs Abs: 1.1 10*3/uL (ref 0.7–4.0)
MCH: 30 pg (ref 26.0–34.0)
MCHC: 31.7 g/dL (ref 30.0–36.0)
MCV: 94.4 fL (ref 80.0–100.0)
Monocytes Absolute: 1.1 10*3/uL — ABNORMAL HIGH (ref 0.1–1.0)
Monocytes Relative: 11 %
Neutro Abs: 7.5 10*3/uL (ref 1.7–7.7)
Neutrophils Relative %: 73 %
Platelets: 293 10*3/uL (ref 150–400)
RBC: 4.64 MIL/uL (ref 4.22–5.81)
RDW: 14.6 % (ref 11.5–15.5)
WBC: 10.1 10*3/uL (ref 4.0–10.5)
nRBC: 0 % (ref 0.0–0.2)

## 2018-09-17 LAB — POTASSIUM: Potassium: 2.9 mmol/L — ABNORMAL LOW (ref 3.5–5.1)

## 2018-09-17 LAB — MAGNESIUM: Magnesium: 1.1 mg/dL — ABNORMAL LOW (ref 1.7–2.4)

## 2018-09-17 LAB — CALCIUM: Calcium: 14.5 mg/dL (ref 8.9–10.3)

## 2018-09-17 MED ORDER — POTASSIUM CHLORIDE 10 MEQ/100ML IV SOLN
10.0000 meq | Freq: Once | INTRAVENOUS | Status: AC
  Administered 2018-09-17: 10 meq via INTRAVENOUS
  Filled 2018-09-17: qty 100

## 2018-09-17 MED ORDER — TETANUS-DIPHTH-ACELL PERTUSSIS 5-2.5-18.5 LF-MCG/0.5 IM SUSP
0.5000 mL | Freq: Once | INTRAMUSCULAR | Status: AC
  Administered 2018-09-17: 0.5 mL via INTRAMUSCULAR
  Filled 2018-09-17: qty 0.5

## 2018-09-17 NOTE — ED Triage Notes (Signed)
Pt called EMS for weakness in his legs, back and arms that has been going on for the past 2 to 3 days. Pt states he takes oxycodone for chronic back pain and "only took one pill today". Pt states he has had pain in his right shoulder that needs surgery. Pt is now stating he has generalized body aches and weakness.

## 2018-09-17 NOTE — ED Notes (Signed)
Date and time results received: 09/17/18 2244 (use smartphrase ".now" to insert current time)  Test: Calcium Critical Value: 14.5  Name of Provider Notified: Dr Eulis Foster  Orders Received? Or Actions Taken?:

## 2018-09-17 NOTE — ED Provider Notes (Signed)
[facility] EMERGENCY DEPARTMENT Provider Note   CSN: [id number] Arrival date & time: 09/17/18  1845    History   Chief Complaint Chief Complaint  Patient presents with  . Generalized Body Aches  . Weakness    HPI Sean [name] is a 68 y.o. male.     Patient reports increasing feeling of weakness over the last 3-4 days, along with bilateral hip and leg pain. His weakness resulted in a fall at home today. He has new left knee discomfort. History of chronic back pain d/t bilateral sciatica. He endorses constipation (takes opioids) and urinary frequency. No fevers, chills, chest pain, shortness of breath, abdominal pain. He has been taking a laxative for his constipation, and reports needing to immediately defecate when the urge strikes. No loss of bowel/bladder control.  The history is provided by the patient. No language interpreter was used.  Weakness  Severity:  Moderate Duration:  4 days Timing:  Intermittent Progression:  Worsening Chronicity:  New Context: pinched nerve   Context: not recent infection   Associated symptoms: arthralgias, frequency and urgency   Associated symptoms: no abdominal pain, no chest pain, no dysuria, no fever, no nausea, no near-syncope and no vomiting     Past Medical History:  Diagnosis Date  . Abnormal EKG    negative stress nuclear-2005; probable inferolateral scarring in 2007- negative EKG at 10 mets   . Bilateral sciatica   . Chronic back pain   . Chronic hip pain, bilateral   . Diabetes mellitus    AODM- no insulin- fasting cbg 100-150  . 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 GERD (gastroesophageal reflux disease)   . H/O hiatal hernia   . HLD (hyperlipidemia)   . HTN (hypertension)   . Hx of dizziness   . Neuropathy   . Obesity   . Paresthesia of foot, bilateral   . Shortness of breath    exertion  . Tobacco abuse    70 pack years    Patient Active Problem List   Diagnosis Date Noted  .  Diarrhea 01/26/2016  . Nausea with vomiting 01/26/2016  . History of adenomatous polyp of colon 01/26/2016  . Encounter for screening colonoscopy 07/06/2015  . Spondylolisthesis of lumbar region 08/17/2012  . Hypokalemia 12/17/2010  . DJD (degenerative joint disease)   . Diabetes mellitus type II 01/25/2010  . Hyperlipidemia 01/25/2010  . Tobacco abuse 01/25/2010  . Hypertension 05/29/2009    Past Surgical History:  Procedure Laterality Date  . BIOPSY  07/11/2015   Procedure: BIOPSY;  Surgeon: Danie Binder, MD;  Location: [facility];  Service: Endoscopy;;  . CATARACT EXTRACTION  2011-12   Bilateral  . COLONOSCOPY     At [facility]: polypectomy x 3 via resection, all with fragments of tubular adenoma.  . COLONOSCOPY WITH PROPOFOL N/A 07/11/2015   Procedure: COLONOSCOPY WITH PROPOFOL;  Surgeon: Danie Binder, MD;  Location: [facility];  Service: Endoscopy;  Laterality: N/A;  200 - per office, pt can't come earlier  . HERNIA REPAIR Bilateral 1997  . JOINT REPLACEMENT     bilateral hip   . LUMBAR SPINE SURGERY  2004   Laminectomy and fusion  . POLYPECTOMY  07/11/2015   Procedure: POLYPECTOMY biopsy multiple;  Surgeon: Danie Binder, MD;  Location: [facility];  Service: Endoscopy;;  ceacl polyp APC used  . TOTAL HIP ARTHROPLASTY  2005   Left  Home Medications    Prior to Admission medications   Medication Sig Start Date End Date Taking? Authorizing Provider  ACCU-CHEK AVIVA PLUS test strip  12/17/10   [provider]  amLODipine-benazepril (LOTREL) 10-40 MG capsule Take 1 capsule by mouth  daily 10/02/15   Arnoldo Lenis, MD  chlorthalidone (HYGROTON) 25 MG tablet Take 1 tablet by mouth  daily 07/27/15   Arnoldo Lenis, MD  Cholecalciferol (VITAMIN D) 1000 UNITS capsule Take 1,000 Units by mouth daily.      [provider]  doxazosin (CARDURA) 4 MG tablet Take 1 tablet by mouth  daily 07/27/15   Arnoldo Lenis, MD  esomeprazole (NEXIUM) 40  MG capsule Take 40 mg by mouth daily.      [provider]  furosemide (LASIX) 40 MG tablet Take 40 mg daily as needed for leg swelling 02/09/15   Arnoldo Lenis, MD  magnesium oxide (MAG-OX) 400 MG tablet Take 400 mg by mouth daily.    [provider]  meloxicam (MOBIC) 15 MG tablet Take 15 mg by mouth daily.  07/30/12   [provider]  metFORMIN (GLUCOPHAGE) 500 MG tablet Take 500-1,000 mg by mouth 2 (two) times daily. Takes 2 tablets in the morning and 1 tablet in the evening.    [provider]  methocarbamol (ROBAXIN) 500 MG tablet Take 1 tablet (500 mg total) by mouth 4 (four) times daily. 01/20/18   Evalee Jefferson, PA-C  metoprolol succinate (TOPROL-XL) 100 MG 24 hr tablet Take 1 tablet by mouth  daily with or immediatley  following a meal 10/02/15   Arnoldo Lenis, MD  oxyCODONE-acetaminophen (PERCOCET/ROXICET) 5-325 MG tablet Take 1 tablet by mouth every 12 (twelve) hours as needed for severe pain. 01/22/17   Garald Balding, MD  pravastatin (PRAVACHOL) 80 MG tablet Take 1 tablet by mouth  every evening 10/02/15   Arnoldo Lenis, MD  sildenafil (VIAGRA) 50 MG tablet Take 50 mg by mouth daily as needed for erectile dysfunction.    [provider]    Family History Family History  Problem Relation Age of Onset  . Stroke Mother   . Colon cancer Neg Hx     Social History Social History   Tobacco Use  . Smoking status: Current Every Day Smoker    Packs/day: 2.00    Years: 50.00    Pack years: 100.00    Types: Cigarettes  . Smokeless tobacco: Never Used  . Tobacco comment: 70 pack years  Substance Use Topics  . Alcohol use: Yes    Alcohol/week: 0.0 standard drinks    Comment: occasional; one drink about 2 times a month.  . Drug use: No     Allergies   Patient has no known allergies.   Review of Systems Review of Systems  Constitutional: Negative for chills and fever.  Cardiovascular: Negative for chest pain, leg  swelling and near-syncope.  Gastrointestinal: Negative for abdominal pain, nausea and vomiting.  Genitourinary: Positive for frequency and urgency. Negative for dysuria.  Musculoskeletal: Positive for arthralgias and back pain.  Skin: Negative for rash.  Neurological: Positive for weakness. Negative for numbness.  All other systems reviewed and are negative.    Physical Exam Updated Vital Signs BP 112/73   Pulse 90   Temp 98.4 F (36.9 C) (Oral)   Resp 15   Ht 6\' 1"  (1.854 m)   Wt 127 kg   SpO2 95%   BMI 36.94 kg/m   Physical  Exam Vitals signs and nursing note reviewed.  Constitutional:      Appearance: Normal appearance.  HENT:     Head: Atraumatic.     Mouth/Throat:     Mouth: Mucous membranes are moist.     Pharynx: Oropharynx is clear.  Eyes:     Conjunctiva/sclera: Conjunctivae normal.  Cardiovascular:     Rate and Rhythm: Normal rate and regular rhythm.  Pulmonary:     Breath sounds: Normal breath sounds.  Abdominal:     Palpations: Abdomen is soft.     Tenderness: There is no abdominal tenderness.  Musculoskeletal: Normal range of motion.     Right lower leg: No edema.     Left lower leg: No edema.  Skin:    General: Skin is warm and dry.  Neurological:     Mental Status: He is alert and oriented to person, place, and time.  Psychiatric:        Mood and Affect: Mood normal.   No strength deficit noted in extremities.   ED Treatments / Results  Labs (all labs ordered are listed, but only abnormal results are displayed) Labs Reviewed  CBC WITH DIFFERENTIAL/PLATELET - Abnormal; Notable for the following components:      Result Value   Monocytes Absolute 1.1 (*)    All other components within normal limits  COMPREHENSIVE METABOLIC PANEL - Abnormal; Notable for the following components:   Potassium 2.6 (*)    Chloride 91 (*)    BUN 26 (*)    Creatinine, Ser 2.09 (*)    Calcium 14.3 (*)    GFR calc non Af Amer 32 (*)    GFR calc Af Amer 37 (*)     All other components within normal limits  URINALYSIS, ROUTINE W REFLEX MICROSCOPIC - Abnormal; Notable for the following components:   Color, Urine AMBER (*)    APPearance HAZY (*)    Bilirubin Urine SMALL (*)    Ketones, ur 5 (*)    Leukocytes,Ua TRACE (*)    Bacteria, UA RARE (*)    All other components within normal limits  POTASSIUM - Abnormal; Notable for the following components:   Potassium 2.9 (*)    All other components within normal limits  CALCIUM - Abnormal; Notable for the following components:   Calcium 14.5 (*)    All other components within normal limits  MAGNESIUM - Abnormal; Notable for the following components:   Magnesium 1.1 (*)    All other components within normal limits    EKG EKG Interpretation  Date/Time:  Thursday September 17 2018 19:21:02 EDT Ventricular Rate:  89 PR Interval:    QRS Duration: 84 QT Interval:  349 QTC Calculation: 425 R Axis:   14 Text Interpretation:  Sinus rhythm Borderline repolarization abnormality Baseline wander in lead(s) V2 since last tracing no significant change Confirmed by Daleen Bo [phone]) on 09/17/2018 9:59:09 PM   Radiology Dg Knee Complete 4 Views Left  Result Date: 09/17/2018 CLINICAL DATA:  Knee pain post fall, weakness, difficulty moving EXAM: LEFT KNEE - COMPLETE 4+ VIEW COMPARISON:  None FINDINGS: Osseous demineralization. Diffuse joint space narrowing greatest at lateral compartment. Chondrocalcinosis question CPPD. Anterior soft tissue swelling especially infrapatellar. No acute fracture, dislocation or bone destruction. Minimal joint effusion. 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 Minimal joint effusion. No acute osseous abnormalities. Electronically Signed   By: Lavonia Dana M.D.   On: 09/17/2018 23:57    Procedures Procedures (including critical care time)  Medications  Ordered in ED Medications - No data to display   Initial Impression / Assessment and Plan / ED Course  I  have reviewed the triage vital signs and the nursing notes.  Pertinent labs & imaging results that were available during my care of the patient were reviewed by me and considered in my medical decision making (see chart for details).        Patient discussed with and seen by Dr. Eulis Foster.  Patient with fall at home due to weakness. His labs reveal hypokalemia and hypercalcemia. He is not on diuretics. He has history of low potassium, but does not take oral preparation at home as directed. IV potassium replacement initiated. No confusion. No concerning ECG changes.  Patient will likely require admission for continuation of potassium and evaluation of hypercalcemia.   Consult with hospitalist--will admit. Final Clinical Impressions(s) / ED Diagnoses   Final diagnoses:  Generalized weakness  Hypokalemia  Hypercalcemia    ED Discharge Orders    None       Etta Quill, NP 09/18/18 [id number]    Daleen Bo, MD 09/18/18 1102

## 2018-09-17 NOTE — ED Notes (Signed)
This nurse requested urine from patient.  Pt states he will urinate soon but cannot at this time.

## 2018-09-17 NOTE — ED Provider Notes (Signed)
  Face-to-face evaluation   History: Patient presents for weakness.  Today he fell because he was weak and injured his left knee.  He denies history of cardiac illness, or taking diuretic medications.  He is a cigarette smoker.  Physical exam: Alert and lucid.  No dysarthria or aphasia.  No respiratory distress.  Left knee tender and swollen with intact extensor mechanism, and a small abrasion anteriorly.  Left knee is grossly stable  Medical screening examination/treatment/procedure(s) were conducted as a shared visit with non-physician practitioner(s) and myself.  I personally evaluated the patient during the encounter    Daleen Bo, MD 09/18/18 1104

## 2018-09-17 NOTE — ED Notes (Signed)
Date and time results received: 09/17/18 2245 (use smartphrase ".now" to insert current time)  Test: Pot Critical Value: 2.9  Name of Provider Notified: Dr Eulis Foster   Orders Received? Or Actions Taken?:

## 2018-09-17 NOTE — ED Notes (Signed)
LAB states re-drawl of blood may be needed. EDP notified. Potasium and Calcium will be redrawn.

## 2018-09-17 NOTE — ED Notes (Signed)
CRITICAL VALUE ALERT  Critical Value:  Calcium 14.3  Date & Time Notied:  09/17/18 2200  Provider Notified: Vira Agar EDP  Orders Received/Actions taken: Order to draw blood again

## 2018-09-17 NOTE — ED Notes (Signed)
CRITICAL VALUE ALERT  Critical Value:  Potassium 2.6  Date & Time Notied:  09/17/18 2200  Provider Notified: Vira Agar, EDP  Orders Received/Actions taken: Order to draw blood again

## 2018-09-17 NOTE — ED Notes (Signed)
Pt able to lift legs and arms but not very high. Pt states his right shoulder has been bothering him and that he needs surgery. Pt shows pain and has hard time lifting right arm. Pt states he has generalized body aches and weakness.

## 2018-09-18 DIAGNOSIS — K219 Gastro-esophageal reflux disease without esophagitis: Secondary | ICD-10-CM | POA: Diagnosis present

## 2018-09-18 DIAGNOSIS — M5431 Sciatica, right side: Secondary | ICD-10-CM | POA: Diagnosis present

## 2018-09-18 DIAGNOSIS — R531 Weakness: Secondary | ICD-10-CM | POA: Diagnosis not present

## 2018-09-18 DIAGNOSIS — E876 Hypokalemia: Secondary | ICD-10-CM | POA: Diagnosis not present

## 2018-09-18 DIAGNOSIS — F17210 Nicotine dependence, cigarettes, uncomplicated: Secondary | ICD-10-CM | POA: Diagnosis present

## 2018-09-18 DIAGNOSIS — Z23 Encounter for immunization: Secondary | ICD-10-CM | POA: Diagnosis not present

## 2018-09-18 DIAGNOSIS — Z7984 Long term (current) use of oral hypoglycemic drugs: Secondary | ICD-10-CM | POA: Diagnosis not present

## 2018-09-18 DIAGNOSIS — N401 Enlarged prostate with lower urinary tract symptoms: Secondary | ICD-10-CM | POA: Diagnosis present

## 2018-09-18 DIAGNOSIS — M25551 Pain in right hip: Secondary | ICD-10-CM | POA: Diagnosis present

## 2018-09-18 DIAGNOSIS — G8929 Other chronic pain: Secondary | ICD-10-CM | POA: Diagnosis present

## 2018-09-18 DIAGNOSIS — N179 Acute kidney failure, unspecified: Secondary | ICD-10-CM | POA: Diagnosis present

## 2018-09-18 DIAGNOSIS — M25562 Pain in left knee: Secondary | ICD-10-CM | POA: Diagnosis present

## 2018-09-18 DIAGNOSIS — Z96643 Presence of artificial hip joint, bilateral: Secondary | ICD-10-CM | POA: Diagnosis present

## 2018-09-18 DIAGNOSIS — R5381 Other malaise: Secondary | ICD-10-CM | POA: Diagnosis not present

## 2018-09-18 DIAGNOSIS — R350 Frequency of micturition: Secondary | ICD-10-CM | POA: Diagnosis present

## 2018-09-18 DIAGNOSIS — E860 Dehydration: Secondary | ICD-10-CM | POA: Diagnosis present

## 2018-09-18 DIAGNOSIS — E11649 Type 2 diabetes mellitus with hypoglycemia without coma: Secondary | ICD-10-CM | POA: Diagnosis present

## 2018-09-18 DIAGNOSIS — T502X5A Adverse effect of carbonic-anhydrase inhibitors, benzothiadiazides and other diuretics, initial encounter: Secondary | ICD-10-CM | POA: Diagnosis present

## 2018-09-18 DIAGNOSIS — E785 Hyperlipidemia, unspecified: Secondary | ICD-10-CM | POA: Diagnosis present

## 2018-09-18 DIAGNOSIS — N182 Chronic kidney disease, stage 2 (mild): Secondary | ICD-10-CM | POA: Diagnosis present

## 2018-09-18 DIAGNOSIS — Z79899 Other long term (current) drug therapy: Secondary | ICD-10-CM | POA: Diagnosis not present

## 2018-09-18 DIAGNOSIS — W19XXXA Unspecified fall, initial encounter: Secondary | ICD-10-CM | POA: Diagnosis not present

## 2018-09-18 DIAGNOSIS — Z86010 Personal history of colonic polyps: Secondary | ICD-10-CM | POA: Diagnosis not present

## 2018-09-18 DIAGNOSIS — M25552 Pain in left hip: Secondary | ICD-10-CM | POA: Diagnosis present

## 2018-09-18 DIAGNOSIS — I129 Hypertensive chronic kidney disease with stage 1 through stage 4 chronic kidney disease, or unspecified chronic kidney disease: Secondary | ICD-10-CM | POA: Diagnosis present

## 2018-09-18 LAB — HEMOGLOBIN A1C
Hgb A1c MFr Bld: 5.7 % — ABNORMAL HIGH (ref 4.8–5.6)
Mean Plasma Glucose: 116.89 mg/dL

## 2018-09-18 LAB — GLUCOSE, CAPILLARY
Glucose-Capillary: 97 mg/dL (ref 70–99)
Glucose-Capillary: 102 mg/dL — ABNORMAL HIGH (ref 70–99)
Glucose-Capillary: 108 mg/dL — ABNORMAL HIGH (ref 70–99)
Glucose-Capillary: 105 mg/dL — ABNORMAL HIGH (ref 70–99)

## 2018-09-18 LAB — COMPREHENSIVE METABOLIC PANEL
ALT: 20 U/L (ref 0–44)
AST: 19 U/L (ref 15–41)
Albumin: 3.4 g/dL — ABNORMAL LOW (ref 3.5–5.0)
Alkaline Phosphatase: 79 U/L (ref 38–126)
Anion gap: 13 (ref 5–15)
BUN: 26 mg/dL — ABNORMAL HIGH (ref 8–23)
CO2: 29 mmol/L (ref 22–32)
Calcium: 14 mg/dL (ref 8.9–10.3)
Chloride: 95 mmol/L — ABNORMAL LOW (ref 98–111)
Creatinine, Ser: 1.54 mg/dL — ABNORMAL HIGH (ref 0.61–1.24)
GFR calc Af Amer: 54 mL/min — ABNORMAL LOW (ref >60)
GFR calc non Af Amer: 46 mL/min — ABNORMAL LOW (ref >60)
Glucose, Bld: 90 mg/dL (ref 70–99)
Potassium: 3 mmol/L — ABNORMAL LOW (ref 3.5–5.1)
Sodium: 137 mmol/L (ref 135–145)
Total Bilirubin: 1.3 mg/dL — ABNORMAL HIGH (ref 0.3–1.2)
Total Protein: 6.9 g/dL (ref 6.5–8.1)

## 2018-09-18 LAB — CBC
HCT: 43.9 % (ref 39.0–52.0)
Hemoglobin: 14.1 g/dL (ref 13.0–17.0)
MCH: 30.4 pg (ref 26.0–34.0)
MCHC: 32.1 g/dL (ref 30.0–36.0)
MCV: 94.6 fL (ref 80.0–100.0)
Platelets: 276 10*3/uL (ref 150–400)
RBC: 4.64 MIL/uL (ref 4.22–5.81)
RDW: 14.5 % (ref 11.5–15.5)
WBC: 9.4 10*3/uL (ref 4.0–10.5)
nRBC: 0 % (ref 0.0–0.2)

## 2018-09-18 LAB — MAGNESIUM: Magnesium: 1.4 mg/dL — ABNORMAL LOW (ref 1.7–2.4)

## 2018-09-18 LAB — PHOSPHORUS: Phosphorus: 2.4 mg/dL — ABNORMAL LOW (ref 2.5–4.6)

## 2018-09-18 LAB — TSH: TSH: 0.363 u[IU]/mL (ref 0.350–4.500)

## 2018-09-18 MED ORDER — POTASSIUM CHLORIDE CRYS ER 20 MEQ PO TBCR
40.0000 meq | EXTENDED_RELEASE_TABLET | ORAL | Status: AC
  Administered 2018-09-18 (×2): 40 meq via ORAL
  Filled 2018-09-18 (×2): qty 2

## 2018-09-18 MED ORDER — OXYCODONE-ACETAMINOPHEN 5-325 MG PO TABS
1.0000 | ORAL_TABLET | Freq: Two times a day (BID) | ORAL | Status: DC | PRN
  Administered 2018-09-18 – 2018-09-19 (×2): 1 via ORAL
  Filled 2018-09-18 (×3): qty 1

## 2018-09-18 MED ORDER — SODIUM CHLORIDE 0.9 % IV SOLN
90.0000 mg | Freq: Once | INTRAVENOUS | Status: DC
  Filled 2018-09-18: qty 10

## 2018-09-18 MED ORDER — PANTOPRAZOLE SODIUM 40 MG PO TBEC
40.0000 mg | DELAYED_RELEASE_TABLET | Freq: Every day | ORAL | Status: DC
  Administered 2018-09-18 – 2018-09-21 (×4): 40 mg via ORAL
  Filled 2018-09-18 (×4): qty 1

## 2018-09-18 MED ORDER — ONDANSETRON HCL 4 MG/2ML IJ SOLN
4.0000 mg | Freq: Four times a day (QID) | INTRAMUSCULAR | Status: DC | PRN
  Filled 2018-09-18: qty 2

## 2018-09-18 MED ORDER — MAGNESIUM SULFATE 2 GM/50ML IV SOLN
2.0000 g | Freq: Once | INTRAVENOUS | Status: AC
  Administered 2018-09-18: 2 g via INTRAVENOUS
  Filled 2018-09-18: qty 50

## 2018-09-18 MED ORDER — INSULIN ASPART 100 UNIT/ML ~~LOC~~ SOLN: 0.0000 [IU] | Freq: Three times a day (TID) | SUBCUTANEOUS | Status: DC

## 2018-09-18 MED ORDER — SODIUM CHLORIDE 0.9 % IV SOLN
INTRAVENOUS | Status: DC
  Administered 2018-09-18 – 2018-09-21 (×4): via INTRAVENOUS

## 2018-09-18 MED ORDER — POTASSIUM CHLORIDE 10 MEQ/100ML IV SOLN: 10.0000 meq | INTRAVENOUS | Status: DC

## 2018-09-18 MED ORDER — HEPARIN SODIUM (PORCINE) 5000 UNIT/ML IJ SOLN
5000.0000 [IU] | Freq: Three times a day (TID) | INTRAMUSCULAR | Status: DC
  Administered 2018-09-18 – 2018-09-21 (×6): 5000 [IU] via SUBCUTANEOUS
  Filled 2018-09-18 (×6): qty 1

## 2018-09-18 MED ORDER — POTASSIUM CHLORIDE 10 MEQ/100ML IV SOLN
10.0000 meq | Freq: Once | INTRAVENOUS | Status: AC
  Administered 2018-09-18: 10 meq via INTRAVENOUS
  Filled 2018-09-18: qty 100

## 2018-09-18 MED ORDER — POTASSIUM CHLORIDE 10 MEQ/100ML IV SOLN
10.0000 meq | INTRAVENOUS | Status: AC
  Administered 2018-09-18 (×2): 10 meq via INTRAVENOUS
  Filled 2018-09-18 (×2): qty 100

## 2018-09-18 MED ORDER — METOPROLOL SUCCINATE ER 50 MG PO TB24
100.0000 mg | ORAL_TABLET | Freq: Every day | ORAL | Status: DC
  Administered 2018-09-18 – 2018-09-21 (×4): 100 mg via ORAL
  Filled 2018-09-18 (×4): qty 2

## 2018-09-18 MED ORDER — ONDANSETRON HCL 4 MG PO TABS: 4.0000 mg | ORAL_TABLET | Freq: Four times a day (QID) | ORAL | Status: DC | PRN

## 2018-09-18 MED ORDER — ACETAMINOPHEN 325 MG PO TABS: 650.0000 mg | ORAL_TABLET | Freq: Four times a day (QID) | ORAL | Status: DC | PRN

## 2018-09-18 MED ORDER — CALCITONIN (SALMON) 200 UNIT/ML IJ SOLN
400.0000 [IU] | Freq: Two times a day (BID) | INTRAMUSCULAR | Status: DC
  Filled 2018-09-18 (×2): qty 2

## 2018-09-18 MED ORDER — SODIUM CHLORIDE 0.9 % IV SOLN
90.0000 mg | Freq: Once | INTRAVENOUS | Status: AC
  Administered 2018-09-18: 90 mg via INTRAVENOUS
  Filled 2018-09-18: qty 30

## 2018-09-18 MED ORDER — MAGNESIUM OXIDE 400 (241.3 MG) MG PO TABS
400.0000 mg | ORAL_TABLET | Freq: Every day | ORAL | Status: DC
  Administered 2018-09-18 – 2018-09-21 (×4): 400 mg via ORAL
  Filled 2018-09-18 (×4): qty 1

## 2018-09-18 MED ORDER — PRAVASTATIN SODIUM 40 MG PO TABS
80.0000 mg | ORAL_TABLET | Freq: Every evening | ORAL | Status: DC
  Administered 2018-09-18 – 2018-09-20 (×3): 80 mg via ORAL
  Filled 2018-09-18 (×3): qty 2

## 2018-09-18 MED ORDER — DOXAZOSIN MESYLATE 2 MG PO TABS
4.0000 mg | ORAL_TABLET | Freq: Every day | ORAL | Status: DC
  Administered 2018-09-18 – 2018-09-21 (×4): 4 mg via ORAL
  Filled 2018-09-18 (×4): qty 2

## 2018-09-18 MED ORDER — CALCITONIN (SALMON) 200 UNIT/ML IJ SOLN
400.0000 [IU] | Freq: Two times a day (BID) | INTRAMUSCULAR | Status: AC
  Administered 2018-09-18 – 2018-09-19 (×4): 400 [IU] via SUBCUTANEOUS
  Filled 2018-09-18 (×4): qty 2

## 2018-09-18 MED ORDER — ACETAMINOPHEN 650 MG RE SUPP: 650.0000 mg | Freq: Four times a day (QID) | RECTAL | Status: DC | PRN

## 2018-09-18 NOTE — Care Management Important Message (Signed)
Important Message  Patient Details  Name: Sean [name] MRN: [id number] Date of Birth: Nov 09, 1950   Medicare Important Message Given:  Yes    [name] 09/18/2018, 12:03 PM

## 2018-09-18 NOTE — ED Notes (Signed)
Patient given snacks to eat and soda.

## 2018-09-18 NOTE — Plan of Care (Signed)
Patient has had an uneventful shift. He has had a large bowel movement and has been voiding appropriately. He has not had any allergic reactions to any medications and pain has been controlled with oxycodone/tylenol. His family (son and daughter-in-law)was updated about the plan of care

## 2018-09-18 NOTE — Progress Notes (Signed)
Patient seen and examined.  Admitted after midnight secondary to generalized weakness, fatigue and poor appetite.  Hemodynamically stable, but with severe signs of dehydration, hypercalcemia and hypokalemia.  On examination patient is slightly somnolent but in no acute distress.  He is afebrile and there has not been any signs of acute source of infection.  Please refer to H&P written by Dr. Darrick Meigs for further info/details on presentation.   Plan: -Continue fluid resuscitation -Continue holding diuretics -Continue electrolytes repletion -Follow calcium level   Barton Dubois [phone]

## 2018-09-18 NOTE — H&P (Addendum)
TRH H&P    Patient Demographics:    Sean [name], is a 68 y.o. male  MRN: [id number]  DOB - 14-Oct-1950  Admit Date - 09/17/2018  Referring MD/NP/PA: Dr. Eulis Foster  Outpatient Primary MD for the patient is Jani Gravel, MD  Patient coming from: Home  Chief complaint-generalized weakness   HPI:    Sean [name]  is a 68 y.o. male, with history of degenerative joint disease, chronic back pain, hypertension, hyperlipidemia, diabetes mellitus type 2 came to ED with generalized weakness.  Patient says he fell as he felt weak injured his left knee.  He denies passing out.  Denies chest pain or shortness of breath.  Complains of nausea and vomiting.  Patient also says that he has poor appetite for past few days.  He has been feeling fatigue with generalized weakness for past few weeks.  In the ED lab work showed significant hypercalcemia with serum calcium 14.5, corrected calcium for albumin is 14.9.  Potassium 2.9, magnesium 1.1.  Creatinine 2.09.  Last creatinine from 2017 was 1.13 He does take 1 tablet of Tums, 1000 unit vitamin D 3 daily     Review of systems:    In addition to the HPI above,   All other systems reviewed and are negative.    Past History of the following :    Past Medical History:  Diagnosis Date  . Abnormal EKG    negative stress nuclear-2005; probable inferolateral scarring in 2007- negative EKG at 10 mets   . Bilateral sciatica   . Chronic back pain   . Chronic hip pain, bilateral   . Diabetes mellitus    AODM- no insulin- fasting cbg 100-150  . 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 GERD (gastroesophageal reflux disease)   . H/O hiatal hernia   . HLD (hyperlipidemia)   . HTN (hypertension)   . Hx of dizziness   . Neuropathy   . Obesity   . Paresthesia of foot, bilateral   . Shortness of breath    exertion  . Tobacco abuse    70 pack years       Past Surgical History:  Procedure Laterality Date  . BIOPSY  07/11/2015   Procedure: BIOPSY;  Surgeon: Danie Binder, MD;  [facility];  Service: Endoscopy;;  . CATARACT EXTRACTION  2011-12   Bilateral  . COLONOSCOPY     At [facility]: polypectomy x 3 via resection, all with fragments of tubular adenoma.  . COLONOSCOPY WITH PROPOFOL N/A 07/11/2015   Procedure: COLONOSCOPY WITH PROPOFOL;  Surgeon: Danie Binder, MD;  Location: [facility];  Service: Endoscopy;  Laterality: N/A;  200 - per office, pt can't come earlier  . HERNIA REPAIR Bilateral 1997  . JOINT REPLACEMENT     bilateral hip   . LUMBAR SPINE SURGERY  2004   Laminectomy and fusion  . POLYPECTOMY  07/11/2015   Procedure: POLYPECTOMY biopsy multiple;  Surgeon: Danie Binder, MD;  Location[facility];  Service: Endoscopy;;  ceacl polyp [facility] used  . TOTAL HIP ARTHROPLASTY  2005   Left      Social History:      Social History   Tobacco Use  . Smoking status: Current Every Day Smoker    Packs/day: 2.00    Years: 50.00    Pack years: 100.00    Types: Cigarettes  . Smokeless tobacco: Never Used  . Tobacco comment: 70 pack years  Substance Use Topics  . Alcohol use: Yes    Alcohol/week: 0.0 standard drinks    Comment: occasional; one drink about 2 times a month.       Family History :     Family History  Problem Relation Age of Onset  . Stroke Mother   . Colon cancer Neg Hx       Home Medications:   Prior to Admission medications   Medication Sig Start Date End Date Taking? Authorizing Provider  ACCU-CHEK AVIVA PLUS test strip  12/17/10   [provider]  amLODipine-benazepril (LOTREL) 10-40 MG capsule Take 1 capsule by mouth  daily 10/02/15   Arnoldo Lenis, MD  chlorthalidone (HYGROTON) 25 MG tablet Take 1 tablet by mouth  daily 07/27/15   Arnoldo Lenis, MD  Cholecalciferol (VITAMIN D) 1000 UNITS capsule Take 1,000 Units by mouth daily.      [provider]   doxazosin (CARDURA) 4 MG tablet Take 1 tablet by mouth  daily 07/27/15   Arnoldo Lenis, MD  esomeprazole (NEXIUM) 40 MG capsule Take 40 mg by mouth daily.      [provider]  furosemide (LASIX) 40 MG tablet Take 40 mg daily as needed for leg swelling 02/09/15   Arnoldo Lenis, MD  magnesium oxide (MAG-OX) 400 MG tablet Take 400 mg by mouth daily.    [provider]  meloxicam (MOBIC) 15 MG tablet Take 15 mg by mouth daily.  07/30/12   [provider]  metFORMIN (GLUCOPHAGE) 500 MG tablet Take 500-1,000 mg by mouth 2 (two) times daily. Takes 2 tablets in the morning and 1 tablet in the evening.    [provider]  methocarbamol (ROBAXIN) 500 MG tablet Take 1 tablet (500 mg total) by mouth 4 (four) times daily. 01/20/18   Evalee Jefferson, PA-C  metoprolol succinate (TOPROL-XL) 100 MG 24 hr tablet Take 1 tablet by mouth  daily with or immediatley  following a meal 10/02/15   Arnoldo Lenis, MD  oxyCODONE-acetaminophen (PERCOCET/ROXICET) 5-325 MG tablet Take 1 tablet by mouth every 12 (twelve) hours as needed for severe pain. 01/22/17   Garald Balding, MD  pravastatin (PRAVACHOL) 80 MG tablet Take 1 tablet by mouth  every evening 10/02/15   Arnoldo Lenis, MD  sildenafil (VIAGRA) 50 MG tablet Take 50 mg by mouth daily as needed for erectile dysfunction.    [provider]     Allergies:    No Known Allergies   Physical Exam:   Vitals  Blood pressure 110/80, pulse 79, temperature 98.4 F (36.9 C), temperature source Oral, resp. rate 15, height 6\' 1"  (1.854 m), weight 127 kg, SpO2 95 %.  1.  General: Appears in no acute distress  2. Psychiatric: Alert, oriented x3, intact insight and judgment  3. Neurologic: Cranial nerves II through XII grossly intact, moving all extremities  4. HEENMT:  Atraumatic normocephalic, extraocular muscle intact  5. Respiratory : Clear to auscultation bilaterally, no wheezing or crackles  6.  Cardiovascular : S1-S2, regular, no murmur  auscultated  7. Gastrointestinal:  Abdomen is soft, nontender, no organomegaly      Data Review:    CBC Recent Labs  Lab 09/17/18 2057  WBC 10.1  HGB 13.9  HCT 43.8  PLT 293  MCV 94.4  MCH 30.0  MCHC 31.7  RDW 14.6  LYMPHSABS 1.1  MONOABS 1.1*  EOSABS 0.4  BASOSABS 0.1   ------------------------------------------------------------------------------------------------------------------  Results for orders placed or performed during the hospital encounter of 09/17/18 (from the past 48 hour(s))  Urinalysis, Routine w reflex microscopic     Status: Abnormal   Collection Time: 09/17/18  8:20 PM  Result Value Ref Range   Color, Urine AMBER (A) YELLOW    Comment: BIOCHEMICALS MAY BE AFFECTED BY COLOR   APPearance HAZY (A) CLEAR   Specific Gravity, Urine 1.018 1.005 - 1.030   pH 5.0 5.0 - 8.0   Glucose, UA NEGATIVE NEGATIVE mg/dL   Hgb urine dipstick NEGATIVE NEGATIVE   Bilirubin Urine SMALL (A) NEGATIVE   Ketones, ur 5 (A) NEGATIVE mg/dL   Protein, ur NEGATIVE NEGATIVE mg/dL   Nitrite NEGATIVE NEGATIVE   Leukocytes,Ua TRACE (A) NEGATIVE   RBC / HPF 0-5 0 - 5 RBC/hpf   WBC, UA 6-10 0 - 5 WBC/hpf   Bacteria, UA RARE (A) NONE SEEN   Squamous Epithelial / LPF 0-5 0 - 5   Mucus PRESENT    Hyaline Casts, UA PRESENT     Comment: Performed at [facility], [address] [place], [place] [address]  CBC with Differential/Platelet     Status: Abnormal   Collection Time: 09/17/18  8:57 PM  Result Value Ref Range   WBC 10.1 4.0 - 10.5 K/uL   RBC 4.64 4.22 - 5.81 MIL/uL   Hemoglobin 13.9 13.0 - 17.0 g/dL   HCT 43.8 39.0 - 52.0 %   MCV 94.4 80.0 - 100.0 fL   MCH 30.0 26.0 - 34.0 pg   MCHC 31.7 30.0 - 36.0 g/dL   RDW 14.6 11.5 - 15.5 %   Platelets 293 150 - 400 K/uL   nRBC 0.0 0.0 - 0.2 %   Neutrophils Relative % 73 %   Neutro Abs 7.5 1.7 - 7.7 K/uL   Lymphocytes Relative 11 %   Lymphs Abs 1.1 0.7 - 4.0 K/uL   Monocytes  Relative 11 %   Monocytes Absolute 1.1 (H) 0.1 - 1.0 K/uL   Eosinophils Relative 4 %   Eosinophils Absolute 0.4 0.0 - 0.5 K/uL   Basophils Relative 1 %   Basophils Absolute 0.1 0.0 - 0.1 K/uL   Immature Granulocytes 0 %   Abs Immature Granulocytes 0.04 0.00 - 0.07 K/uL    Comment: Performed at [facility], [address] [place], [place] [address]  Comprehensive metabolic panel     Status: Abnormal   Collection Time: 09/17/18  8:57 PM  Result Value Ref Range   Sodium 136 135 - 145 mmol/L   Potassium 2.6 (LL) 3.5 - 5.1 mmol/L    Comment: CRITICAL RESULT CALLED TO, READ BACK BY AND VERIFIED WITH: OAKLEY,H AT 2200 ON 4.30.20 BY ISLEY,B    Chloride 91 (L) 98 - 111 mmol/L   CO2 30 22 - 32 mmol/L   Glucose, Bld 88 70 - 99 mg/dL   BUN 26 (H) 8 - 23 mg/dL   Creatinine, Ser 2.09 (H) 0.61 - 1.24 mg/dL   Calcium 14.3 (HH) 8.9 - 10.3 mg/dL    Comment: CRITICAL RESULT CALLED TO, READ BACK BY AND  VERIFIED WITH: OAKLEY,H AT 2200 ON 4.30.20 BY ISLEY,B    Total Protein 7.0 6.5 - 8.1 g/dL   Albumin 3.5 3.5 - 5.0 g/dL   AST 21 15 - 41 U/L   ALT 20 0 - 44 U/L   Alkaline Phosphatase 80 38 - 126 U/L   Total Bilirubin 1.1 0.3 - 1.2 mg/dL   GFR calc non Af Amer 32 (L) >60 mL/min   GFR calc Af Amer 37 (L) >60 mL/min   Anion gap 15 5 - 15    Comment: Performed at [facility], [address] [place], [place] [address]  Potassium     Status: Abnormal   Collection Time: 09/17/18 10:22 PM  Result Value Ref Range   Potassium 2.9 (L) 3.5 - 5.1 mmol/L    Comment: CRITICAL RESULT CALLED TO, READ BACK BY AND VERIFIED WITH: NORMAN,B AT 2243 ON 4.30.20 BY ISLEY,B Performed at [facility], [address], [place], [place] [address]   Calcium     Status: Abnormal   Collection Time: 09/17/18 10:22 PM  Result Value Ref Range   Calcium 14.5 (HH) 8.9 - 10.3 mg/dL    Comment: CRITICAL RESULT CALLED TO, READ BACK BY AND VERIFIED WITH: NORMAN,B AT 2243 ON 4.30.20 BY ISLEY,B Performed at [facility],  [address], [place], [place] [address]   Magnesium     Status: Abnormal   Collection Time: 09/17/18 10:22 PM  Result Value Ref Range   Magnesium 1.1 (L) 1.7 - 2.4 mg/dL    Comment: Performed at [facility], [address] [place], [place] [address]  Phosphorus     Status: Abnormal   Collection Time: 09/17/18 10:22 PM  Result Value Ref Range   Phosphorus 2.4 (L) 2.5 - 4.6 mg/dL    Comment: Performed at [facility], [address], [place], [place] [address]  TSH     Status: None   Collection Time: 09/17/18 10:22 PM  Result Value Ref Range   TSH 0.363 0.350 - 4.500 uIU/mL    Comment: Performed by a 3rd Generation assay with a functional sensitivity of <=0.01 uIU/mL. Performed at [facility], [address] [place], [place] [address]     Chemistries  Recent Labs  Lab 09/17/18 2057 09/17/18 2222  NA 136  --   K 2.6* 2.9*  CL 91*  --   CO2 30  --   GLUCOSE 88  --   BUN 26*  --   CREATININE 2.09*  --   CALCIUM 14.3* 14.5*  MG  --  1.1*  AST 21  --   ALT 20  --   ALKPHOS 80  --   BILITOT 1.1  --    ------------------------------------------------------------------------------------------------------------------  ------------------------------------------------------------------------------------------------------------------ GFR: Estimated Creatinine Clearance: 48.5 mL/min (A) (by C-G formula based on SCr of 2.09 mg/dL (H)). Liver Function Tests: Recent Labs  Lab 09/17/18 2057  AST 21  ALT 20  ALKPHOS 80  BILITOT 1.1  PROT 7.0  ALBUMIN 3.5    Recent Labs    09/17/18 2222  TSH 0.363   Anemia Panel: No results for input(s): VITAMINB12, FOLATE, FERRITIN, TIBC, IRON, RETICCTPCT in the last 72 hours.  --------------------------------------------------------------------------------------------------------------- Urine analysis:    Component Value Date/Time   COLORURINE AMBER (A) 09/17/2018 2020   APPEARANCEUR HAZY (A) 09/17/2018 2020   LABSPEC 1.018 09/17/2018  2020   PHURINE 5.0 09/17/2018 2020   GLUCOSEU NEGATIVE 09/17/2018 2020   HGBUR NEGATIVE 09/17/2018 2020   BILIRUBINUR SMALL (A) 09/17/2018 2020  KETONESUR 5 (A) 09/17/2018 2020   PROTEINUR NEGATIVE 09/17/2018 2020   UROBILINOGEN 1.0 12/24/2010 0922   NITRITE NEGATIVE 09/17/2018 2020   LEUKOCYTESUR TRACE (A) 09/17/2018 2020      Imaging Results:    Dg Knee Complete 4 Views Left  Result Date: 09/17/2018 CLINICAL DATA:  Knee pain post fall, weakness, difficulty moving EXAM: LEFT KNEE - COMPLETE 4+ VIEW COMPARISON:  None FINDINGS: Osseous demineralization. Diffuse joint space narrowing greatest at lateral compartment. Chondrocalcinosis question CPPD. Anterior soft tissue swelling especially infrapatellar. No acute fracture, dislocation or bone destruction. Minimal joint effusion. 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 Minimal joint effusion. No acute osseous abnormalities. Electronically Signed   By: Lavonia Dana M.D.   On: 09/17/2018 23:57    My personal review of EKG: Rhythm NSR, T wave inversions noted in lead II, III, aVF, V4 V5 V6   Assessment & Plan:    Active Problems:   Hypercalcemia   1. Hypercalcemia-unclear etiology, significantly elevated calcium of 14.5, corrected calcium 14.9 for albumin.  Will start normal saline at 125 mL/h, calcitonin 400 units twice a day for 4 doses, pamidronate 90 mg IV x1.  Significantly elevated calcium concerning for malignancy, will obtain SPEP, check PTH, phosphorus, vitamin D level, TSH.  Consider PTH rp if all above tests are inconclusive.  Patient is on chlorthalidone which can also cause hypercalcemia.  Will hold chlorthalidone at this time.  2. Acute kidney injury on CKD stage II-patient's last creatinine was 1.13, in 2017.  Unclear baseline since then.  Started on normal saline as above.  3. Hypokalemia-likely diuretic induced, patient is on chlorthalidone.  Will replace potassium and check BMP in a.m.  4.  Hypomagnesemia-magnesium is 1.1, will replace magnesium 2 g mag sulfate IV x1.  Repeat magnesium level in a.m.  5. Diabetes mellitus type 2-we will hold metformin, start sliding scale insulin with NovoLog.  6. Left knee pain/status post fall-x-ray of the knee shows no acute abnormality.  Continue PRN Percocet for pain control.  7. Hypertension-blood pressure is stable, continue Toprol-XL 100 mg p.o. daily.  Will hold Cardura, chlorthalidone, amlodipine/benazepril    DVT Prophylaxis-   Heparin  AM Labs Ordered, also please review Full Orders  Family Communication: Admission, patients condition and plan of care including tests being ordered have been discussed with the patient  who indicate understanding and agree with the plan and Code Status.  Code Status: Full code  Admission status: Inpatient: Based on patients clinical presentation and evaluation of above clinical data, I have made determination that patient meets Inpatient criteria at this time.  Time spent in minutes : 60 minutes   Oswald Hillock M.D on 09/18/2018 at 3:18 AM

## 2018-09-19 DIAGNOSIS — I10 Essential (primary) hypertension: Secondary | ICD-10-CM

## 2018-09-19 DIAGNOSIS — N400 Enlarged prostate without lower urinary tract symptoms: Secondary | ICD-10-CM

## 2018-09-19 DIAGNOSIS — K219 Gastro-esophageal reflux disease without esophagitis: Secondary | ICD-10-CM

## 2018-09-19 DIAGNOSIS — N179 Acute kidney failure, unspecified: Secondary | ICD-10-CM

## 2018-09-19 LAB — BASIC METABOLIC PANEL
Anion gap: 14 (ref 5–15)
BUN: 20 mg/dL (ref 8–23)
CO2: 31 mmol/L (ref 22–32)
Calcium: 13.4 mg/dL (ref 8.9–10.3)
Chloride: 97 mmol/L — ABNORMAL LOW (ref 98–111)
Creatinine, Ser: 1.21 mg/dL (ref 0.61–1.24)
GFR calc Af Amer: >60 mL/min (ref >60)
GFR calc non Af Amer: >60 mL/min (ref >60)
Glucose, Bld: 106 mg/dL — ABNORMAL HIGH (ref 70–99)
Potassium: 3.1 mmol/L — ABNORMAL LOW (ref 3.5–5.1)
Sodium: 142 mmol/L (ref 135–145)

## 2018-09-19 LAB — PARATHYROID HORMONE, INTACT (NO CA): PTH: 6 pg/mL — ABNORMAL LOW (ref 15–65)

## 2018-09-19 LAB — GLUCOSE, CAPILLARY
Glucose-Capillary: 88 mg/dL (ref 70–99)
Glucose-Capillary: 104 mg/dL — ABNORMAL HIGH (ref 70–99)
Glucose-Capillary: 88 mg/dL (ref 70–99)

## 2018-09-19 LAB — MAGNESIUM: Magnesium: 1.5 mg/dL — ABNORMAL LOW (ref 1.7–2.4)

## 2018-09-19 LAB — HIV ANTIBODY (ROUTINE TESTING W REFLEX): HIV Screen 4th Generation wRfx: NONREACTIVE

## 2018-09-19 MED ORDER — POTASSIUM CHLORIDE CRYS ER 20 MEQ PO TBCR
40.0000 meq | EXTENDED_RELEASE_TABLET | Freq: Once | ORAL | Status: AC
  Administered 2018-09-19: 40 meq via ORAL
  Filled 2018-09-19: qty 2

## 2018-09-19 NOTE — Progress Notes (Signed)
Pt refused lunch time blood sugar check for NT. MD made aware. MD is to visit patient soon and re-educate why all of this plan of care is important. Attending RN has been educating throughout shift as well.

## 2018-09-19 NOTE — Progress Notes (Signed)
PROGRESS NOTE    Sean [name]  BPZ:[id number] DOB: May 28, 1950 DOA: 09/17/2018 PCP: Jani Gravel, MD     Brief Narrative:  68 year old with a past medical history significant for hypertension, gastroesophageal reflux disease, BPH and hyperlipidemia; presented to the hospital due to generalized weakness, fatigue and poor appetite.  Patient was fine hemodynamically stable, but with severe signs of dehydration, hypercalcemia, hypokalemia, hypomagnesemia and acute kidney injury.  Please refer to H&P written by Dr. Darrick Meigs on 09/18/2018 for further info/details on admission.  Assessment & Plan: 1-dehydration/hypercalcemia -Continue IV fluids -Patient encouraged to increase oral intake -Continue holding diuretics -Continue holding vitamin D -PTH was low on exam -After Aredia, fluid resuscitation and calcitonin potassium down to 13 -Continue to follow trend.  2-acute kidney injury -Appears to be prerenal in the setting of dehydration and continue use of nephrotoxic agents. -Continue IV fluids -Patient encouraged to keep himself hydrated -There was no signs of infection in his urine analysis -Creatinine trending down appropriately -Repeat basic metabolic panel in a.m.  3-essential hypertension blood pressure is a stable -Continue the use of Toprol and doxazosin -Education regarding heart healthy diet has been provided -Will continue holding chlorthalidone, benazepril and amlodipine   4-HLD -continue statins  5-hypokalemia and hypomagnesemia -Continue electrolytes repletion -Follow trend.  6-history of BPH -Continue Cardura -Patient denies urinary retention symptoms.  7-gastroesophageal reflux disease -Continue PPI.  DVT prophylaxis: Heparin Code Status: Full code Family Communication: No family at bedside; son updated over the phone on 09/18/2018 by myself, nursing staff provided updates today as well. Disposition Plan: Remains inpatient, continue holding diuretics, continue IV  fluids (but decrease rate), continue potassium repletion and follow calcium level.  We will also replete magnesium.  Consultants:   None  Procedures:   See below for x-ray reports.  Antimicrobials:  Anti-infectives (From admission, onward)   None       Subjective: Afebrile, no chest pain, no nausea, no vomiting.  Reports he still feeling weak, but improving.  Objective: Vitals:   09/18/18 1954 09/18/18 2206 09/19/18 0447 09/19/18 0819  BP:  130/81 118/68   Pulse:  85 76   Resp:  20 18   Temp:  98.9 F (37.2 C) 99 F (37.2 C)   TempSrc:  Oral Oral   SpO2: 93% 92% 90% 92%  Weight:      Height:        Intake/Output Summary (Last 24 hours) at 09/19/2018 1625 Last data filed at 09/19/2018 1600 Gross per 24 hour  Intake 1050.8 ml  Output -  Net 1050.8 ml   Filed Weights   09/17/18 1907 09/18/18 0400  Weight: 127 kg 110.6 kg    Examination: General exam: Alert, awake, oriented x 3; still feeling somewhat weak, but denies chest pain, shortness of breath, nausea, vomiting or any other acute complaints. Respiratory system: Clear to auscultation. Respiratory effort normal. Cardiovascular system:RRR. No murmurs, rubs, gallops. Gastrointestinal system: Abdomen is nondistended, soft and nontender. No organomegaly or masses felt. Normal bowel sounds heard. Central nervous system: Alert and oriented. No focal neurological deficits. Extremities: No C/C/E, +pedal pulses Skin: No rashes, lesions or ulcers Psychiatry: Judgement and insight appear normal. Mood & affect appropriate.     Data Reviewed: I have personally reviewed following labs and imaging studies  CBC: Recent Labs  Lab 09/17/18 2057 09/18/18 0805  WBC 10.1 9.4  NEUTROABS 7.5  --   HGB 13.9 14.1  HCT 43.8 43.9  MCV 94.4 94.6  PLT 293 276   Basic  Metabolic Panel: Recent Labs  Lab 09/17/18 2057 09/17/18 2222 09/18/18 0805 09/19/18 1027  NA 136  --  137 142  K 2.6* 2.9* 3.0* 3.1*  CL 91*  --  95*  97*  CO2 30  --  29 31  GLUCOSE 88  --  90 106*  BUN 26*  --  26* 20  CREATININE 2.09*  --  1.54* 1.21  CALCIUM 14.3* 14.5* 14.0* 13.4*  MG  --  1.1* 1.4* 1.5*  PHOS  --  2.4*  --   --    GFR: Estimated Creatinine Clearance: 78.3 mL/min (by C-G formula based on SCr of 1.21 mg/dL).   Liver Function Tests: Recent Labs  Lab 09/17/18 2057 09/18/18 0805  AST 21 19  ALT 20 20  ALKPHOS 80 79  BILITOT 1.1 1.3*  PROT 7.0 6.9  ALBUMIN 3.5 3.4*   HbA1C: Recent Labs    09/18/18 0331  HGBA1C 5.7*   CBG: Recent Labs  Lab 09/18/18 0755 09/18/18 1153 09/18/18 1642 09/18/18 2208 09/19/18 0755  GLUCAP 97 102* 108* 105* 88   Thyroid Function Tests: Recent Labs    09/17/18 2222  TSH 0.363   Urine analysis:    Component Value Date/Time   COLORURINE AMBER (A) 09/17/2018 2020   APPEARANCEUR HAZY (A) 09/17/2018 2020   LABSPEC 1.018 09/17/2018 2020   PHURINE 5.0 09/17/2018 2020   GLUCOSEU NEGATIVE 09/17/2018 2020   HGBUR NEGATIVE 09/17/2018 2020   BILIRUBINUR SMALL (A) 09/17/2018 2020   KETONESUR 5 (A) 09/17/2018 2020   PROTEINUR NEGATIVE 09/17/2018 2020   UROBILINOGEN 1.0 12/24/2010 0922   NITRITE NEGATIVE 09/17/2018 2020   LEUKOCYTESUR TRACE (A) 09/17/2018 2020   Radiology Studies: Dg Knee Complete 4 Views Left  Result Date: 09/17/2018 CLINICAL DATA:  Knee pain post fall, weakness, difficulty moving EXAM: LEFT KNEE - COMPLETE 4+ VIEW COMPARISON:  None FINDINGS: Osseous demineralization. Diffuse joint space narrowing greatest at lateral compartment. Chondrocalcinosis question CPPD. Anterior soft tissue swelling especially infrapatellar. No acute fracture, dislocation or bone destruction. Minimal joint effusion. 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 Minimal joint effusion. No acute osseous abnormalities. Electronically Signed   By: Lavonia Dana M.D.   On: 09/17/2018 23:57    Scheduled Meds: . calcitonin  400 Units Subcutaneous BID  . doxazosin  4 mg  Oral Daily  . heparin  5,000 Units Subcutaneous Q8H  . insulin aspart  0-9 Units Subcutaneous TID WC  . magnesium oxide  400 mg Oral Daily  . metoprolol succinate  100 mg Oral Daily  . pantoprazole  40 mg Oral Daily  . pravastatin  80 mg Oral QPM   Continuous Infusions: . sodium chloride Stopped (09/19/18 1023)     LOS: 1 day    Time spent: 30 minutes    Barton Dubois, MD [facility] Pager [phone]  09/19/2018, 4:25 PM

## 2018-09-19 NOTE — Progress Notes (Signed)
Pt is refusing his telemetry box to be put on. MD made aware, and patient educated the importance of wearing it but still refused. Central telemetry made aware. Will continue to monitor.

## 2018-09-19 NOTE — Progress Notes (Signed)
CRITICAL VALUE ALERT  Critical Value:  Calcium 13.4  Date & Time Notied: 09/19/18 @ 1120.  Provider Notified: Dyann Kief, MD.  Orders Received/Actions taken: Continue to monitor.

## 2018-09-20 LAB — BASIC METABOLIC PANEL
Anion gap: 10 (ref 5–15)
BUN: 18 mg/dL (ref 8–23)
CO2: 29 mmol/L (ref 22–32)
Calcium: 11.7 mg/dL — ABNORMAL HIGH (ref 8.9–10.3)
Chloride: 99 mmol/L (ref 98–111)
Creatinine, Ser: 1.14 mg/dL (ref 0.61–1.24)
GFR calc Af Amer: >60 mL/min (ref >60)
GFR calc non Af Amer: >60 mL/min (ref >60)
Glucose, Bld: 97 mg/dL (ref 70–99)
Potassium: 2.9 mmol/L — ABNORMAL LOW (ref 3.5–5.1)
Sodium: 138 mmol/L (ref 135–145)

## 2018-09-20 LAB — GLUCOSE, CAPILLARY
Glucose-Capillary: 97 mg/dL (ref 70–99)
Glucose-Capillary: 97 mg/dL (ref 70–99)
Glucose-Capillary: 84 mg/dL (ref 70–99)
Glucose-Capillary: 75 mg/dL (ref 70–99)

## 2018-09-20 LAB — MAGNESIUM: Magnesium: 1.1 mg/dL — ABNORMAL LOW (ref 1.7–2.4)

## 2018-09-20 MED ORDER — POTASSIUM CHLORIDE CRYS ER 20 MEQ PO TBCR
40.0000 meq | EXTENDED_RELEASE_TABLET | ORAL | Status: AC
  Administered 2018-09-20 (×3): 40 meq via ORAL
  Filled 2018-09-20 (×5): qty 2

## 2018-09-20 MED ORDER — MAGNESIUM SULFATE 2 GM/50ML IV SOLN
2.0000 g | Freq: Once | INTRAVENOUS | Status: AC
  Administered 2018-09-20: 09:00:00 2 g via INTRAVENOUS
  Filled 2018-09-20: qty 50

## 2018-09-20 MED ORDER — POTASSIUM CHLORIDE CRYS ER 20 MEQ PO TBCR
40.0000 meq | EXTENDED_RELEASE_TABLET | Freq: Every day | ORAL | Status: DC
  Administered 2018-09-20 – 2018-09-21 (×2): 40 meq via ORAL
  Filled 2018-09-20 (×2): qty 2

## 2018-09-20 NOTE — Progress Notes (Signed)
Patient refused his telemetry. Dr. Dyann Kief notified. Patient educated and will continue to monitor.

## 2018-09-20 NOTE — Progress Notes (Addendum)
PROGRESS NOTE    Sean [name]  ZYS:[id number] DOB: [birth year] DOA: 09/17/2018 PCP: Jani Gravel, MD     Brief Narrative:  67 year old with a past medical history significant for hypertension, gastroesophageal reflux disease, BPH and hyperlipidemia; presented to the hospital due to generalized weakness, fatigue and poor appetite.  Patient was fine hemodynamically stable, but with severe signs of dehydration, hypercalcemia, hypokalemia, hypomagnesemia and acute kidney injury.  Please refer to H&P written by Dr. Darrick Meigs on 09/18/2018 for further info/details on admission.  Assessment & Plan: 1-dehydration/hypercalcemia -Continue IV fluids -Patient encouraged to increase oral intake -Continue holding diuretics -Continue holding vitamin D -PTH was low on exam -After Aredia, fluid resuscitation and calcitonin potassium down to 11.7 -Continue to follow trend.  2-acute kidney injury -Appears to be prerenal in the setting of dehydration and continue use of nephrotoxic agents. -Continue IV fluids -Patient encouraged to keep himself well hydrated -There was no signs of infection in his urinanalysis -Creatinine trending down appropriately and renal function essentially back to normal. -Will repeat basic metabolic panel in a.m.  3-essential hypertension  -blood pressure is a stable -Continue the use of Toprol and doxazosin -Education regarding heart healthy diet has been provided -Will continue holding chlorthalidone, benazepril and amlodipine   4-HLD -continue statins  5-hypokalemia and hypomagnesemia -Continue electrolytes repletion -Follow trend. -potassium 2.9 today  6-history of BPH -Continue Cardura -Patient denies urinary retention symptoms.  7-gastroesophageal reflux disease -Continue PPI.  DVT prophylaxis: Heparin Code Status: Full code Family Communication: No family at bedside; son updated over the phone on 09/18/2018 by myself, nursing staff provided updates today as well.  Disposition Plan: Remains inpatient, continue holding diuretics, continue IV fluids (but decrease rate), continue potassium repletion and follow calcium level.  Will also replete magnesium level and follow trend.  Consultants:   None  Procedures:   See below for x-ray reports.  Antimicrobials:  Anti-infectives (From admission, onward)   None       Subjective: No fever, no chest pain, no nausea, no vomiting.  Patient reports still feeling weak and with poor appetite.  Has refused to wear telemetry.  Objective: Vitals:   09/19/18 2135 09/20/18 0523 09/20/18 0912 09/20/18 1349  BP: (!) 127/97 (!) 145/85  129/74  Pulse: 81 66  78  Resp:    17  Temp: 98.1 F (36.7 C) 97.9 F (36.6 C)  98.5 F (36.9 C)  TempSrc: Oral Oral  Oral  SpO2: 91% (!) 88% 90% 94%  Weight:      Height:        Intake/Output Summary (Last 24 hours) at 09/20/2018 1648 Last data filed at 09/20/2018 1550 Gross per 24 hour  Intake 2212.73 ml  Output 300 ml  Net 1912.73 ml   Filed Weights   09/17/18 1907 09/18/18 0400  Weight: 127 kg 110.6 kg    Examination: General exam: Alert, awake, oriented x 3; reports he still feeling weak, no chest pain, no shortness of breath, no nausea, no vomiting.  Patient denies having diarrhea.  Appetite is still poor. Respiratory system: Clear to auscultation. Respiratory effort normal. Cardiovascular system:RRR. No murmurs, rubs, gallops. Gastrointestinal system: Abdomen is nondistended, soft and nontender. No organomegaly or masses felt. Normal bowel sounds heard. Central nervous system: Alert and oriented. No focal neurological deficits. Extremities: No C/C/E, +pedal pulses Skin: No rashes, lesions or ulcers Psychiatry: Judgement and insight appear normal. Mood & affect appropriate.   Data Reviewed: I have personally reviewed following labs and imaging studies  CBC:  Recent Labs  Lab 09/17/18 2057 09/18/18 0805  WBC 10.1 9.4  NEUTROABS 7.5  --   HGB 13.9 14.1   HCT 43.8 43.9  MCV 94.4 94.6  PLT 293 338   Basic Metabolic Panel: Recent Labs  Lab 09/17/18 2057 09/17/18 2222 09/18/18 0805 09/19/18 1027 09/20/18 0620  NA 136  --  137 142 138  K 2.6* 2.9* 3.0* 3.1* 2.9*  CL 91*  --  95* 97* 99  CO2 30  --  29 31 29   GLUCOSE 88  --  90 106* 97  BUN 26*  --  26* 20 18  CREATININE 2.09*  --  1.54* 1.21 1.14  CALCIUM 14.3* 14.5* 14.0* 13.4* 11.7*  MG  --  1.1* 1.4* 1.5* 1.1*  PHOS  --  2.4*  --   --   --    GFR: Estimated Creatinine Clearance: 83.1 mL/min (by C-G formula based on SCr of 1.14 mg/dL).   Liver Function Tests: Recent Labs  Lab 09/17/18 2057 09/18/18 0805  AST 21 19  ALT 20 20  ALKPHOS 80 79  BILITOT 1.1 1.3*  PROT 7.0 6.9  ALBUMIN 3.5 3.4*   HbA1C: Recent Labs    09/18/18 0331  HGBA1C 5.7*   CBG: Recent Labs  Lab 09/19/18 1648 09/19/18 2132 09/20/18 0711 09/20/18 1108 09/20/18 1617  GLUCAP 104* 88 97 97 84   Thyroid Function Tests: Recent Labs    09/17/18 2222  TSH 0.363   Urine analysis:    Component Value Date/Time   COLORURINE AMBER (A) 09/17/2018 2020   APPEARANCEUR HAZY (A) 09/17/2018 2020   LABSPEC 1.018 09/17/2018 2020   PHURINE 5.0 09/17/2018 2020   GLUCOSEU NEGATIVE 09/17/2018 2020   HGBUR NEGATIVE 09/17/2018 2020   BILIRUBINUR SMALL (A) 09/17/2018 2020   KETONESUR 5 (A) 09/17/2018 2020   PROTEINUR NEGATIVE 09/17/2018 2020   UROBILINOGEN 1.0 12/24/2010 0922   NITRITE NEGATIVE 09/17/2018 2020   LEUKOCYTESUR TRACE (A) 09/17/2018 2020   Radiology Studies: No results found.  Scheduled Meds: . doxazosin  4 mg Oral Daily  . heparin  5,000 Units Subcutaneous Q8H  . insulin aspart  0-9 Units Subcutaneous TID WC  . magnesium oxide  400 mg Oral Daily  . metoprolol succinate  100 mg Oral Daily  . pantoprazole  40 mg Oral Daily  . potassium chloride  40 mEq Oral Q4H  . potassium chloride  40 mEq Oral Daily  . pravastatin  80 mg Oral QPM   Continuous Infusions: . sodium chloride 100  mL/hr at 09/20/18 1550     LOS: 2 days    Time spent: 30 minutes    Barton Dubois, MD [facility] Pager [phone]  09/20/2018, 4:48 PM

## 2018-09-21 DIAGNOSIS — E119 Type 2 diabetes mellitus without complications: Secondary | ICD-10-CM

## 2018-09-21 DIAGNOSIS — N179 Acute kidney failure, unspecified: Secondary | ICD-10-CM

## 2018-09-21 DIAGNOSIS — N400 Enlarged prostate without lower urinary tract symptoms: Secondary | ICD-10-CM

## 2018-09-21 DIAGNOSIS — R5381 Other malaise: Secondary | ICD-10-CM

## 2018-09-21 DIAGNOSIS — R531 Weakness: Secondary | ICD-10-CM

## 2018-09-21 LAB — CALCITRIOL (1,25 DI-OH VIT D): Vit D, 1,25-Dihydroxy: 63.1 pg/mL (ref 19.9–79.3)

## 2018-09-21 LAB — BASIC METABOLIC PANEL
Anion gap: 11 (ref 5–15)
BUN: 16 mg/dL (ref 8–23)
CO2: 25 mmol/L (ref 22–32)
Calcium: 10.3 mg/dL (ref 8.9–10.3)
Chloride: 102 mmol/L (ref 98–111)
Creatinine, Ser: 1.04 mg/dL (ref 0.61–1.24)
GFR calc Af Amer: >60 mL/min (ref >60)
GFR calc non Af Amer: >60 mL/min (ref >60)
Glucose, Bld: 74 mg/dL (ref 70–99)
Potassium: 3.3 mmol/L — ABNORMAL LOW (ref 3.5–5.1)
Sodium: 138 mmol/L (ref 135–145)

## 2018-09-21 LAB — PROTEIN ELECTROPHORESIS, SERUM
A/G Ratio: 0.9 (ref 0.7–1.7)
Albumin ELP: 3.2 g/dL (ref 2.9–4.4)
Alpha-1-Globulin: 0.3 g/dL (ref 0.0–0.4)
Alpha-2-Globulin: 1.2 g/dL — ABNORMAL HIGH (ref 0.4–1.0)
Beta Globulin: 1.2 g/dL (ref 0.7–1.3)
Gamma Globulin: 1 g/dL (ref 0.4–1.8)
Globulin, Total: 3.6 g/dL (ref 2.2–3.9)
Total Protein ELP: 6.8 g/dL (ref 6.0–8.5)

## 2018-09-21 LAB — MAGNESIUM: Magnesium: 1.1 mg/dL — ABNORMAL LOW (ref 1.7–2.4)

## 2018-09-21 LAB — GLUCOSE, CAPILLARY
Glucose-Capillary: 91 mg/dL (ref 70–99)
Glucose-Capillary: 97 mg/dL (ref 70–99)

## 2018-09-21 MED ORDER — MAGNESIUM SULFATE 2 GM/50ML IV SOLN
2.0000 g | Freq: Once | INTRAVENOUS | Status: AC
  Administered 2018-09-21: 09:00:00 2 g via INTRAVENOUS
  Filled 2018-09-21: qty 50

## 2018-09-21 MED ORDER — MAGNESIUM OXIDE 400 MG PO TABS: 400.0000 mg | ORAL_TABLET | Freq: Two times a day (BID) | ORAL | 1 refills | Status: DC

## 2018-09-21 MED ORDER — POTASSIUM CHLORIDE CRYS ER 20 MEQ PO TBCR: 40.0000 meq | EXTENDED_RELEASE_TABLET | Freq: Every day | ORAL | 0 refills | Status: AC

## 2018-09-21 MED ORDER — OXYCODONE-ACETAMINOPHEN 5-325 MG PO TABS: 1.0000 | ORAL_TABLET | Freq: Two times a day (BID) | ORAL | Status: DC | PRN

## 2018-09-21 NOTE — Progress Notes (Signed)
Patient is still refusing to wear the heart monitor, explain what it was for and why it is needed.  He still says no.

## 2018-09-21 NOTE — Discharge Summary (Signed)
Physician Discharge Summary  PIERRE DELLAROCCO FXT:[id number] DOB: 1950/05/27 DOA: 09/17/2018  PCP: Jani Gravel, MD  Admit date: 09/17/2018 Discharge date: 09/21/2018  Time spent: 35 minutes  Recommendations for Outpatient Follow-up:  1. Repeat basic metabolic panel to follow electrolytes and renal function 2. Reassess blood pressure and adjust antihypertensive regimen as needed 3. Close follow-up to patient's CBGs with further adjustment to hypoglycemic regimen as required.   Discharge Diagnoses:  Active Problems:   Type 2 diabetes mellitus treated without insulin (HCC)   Hypercalcemia   Generalized weakness   Physical deconditioning   Benign prostatic hyperplasia without lower urinary tract symptoms   AKI (acute kidney injury) ([place])   Hypomagnesemia   Discharge Condition: Stable and improved.  Patient discharged home with instruction to follow-up with PCP in 10 days.  Home health physical therapy arranged at discharge to facilitate home adaptation, safety and conditioning.  Diet recommendation: Heart healthy and modified carbohydrate diet.  Filed Weights   09/17/18 1907 09/18/18 0400  Weight: 127 kg 110.6 kg    History of present illness:  68 year old with a past medical history significant for hypertension, gastroesophageal reflux disease, BPH and hyperlipidemia; presented to the hospital due to generalized weakness, fatigue and poor appetite.  Patient was fine hemodynamically stable, but with severe signs of dehydration, hypercalcemia, hypokalemia, hypomagnesemia and acute kidney injury.  Please refer to H&P written by Dr. Darrick Meigs on 09/18/2018 for further info/details on admission.  Hospital Course:  1-dehydration/hypercalcemia -Patient encouraged to increase oral intake and to maintain adequate hydration. -Continue holding diuretics at discharge. -Instructed to discontinue vitamin D supplementation for now. -PTH was low on exam -After Aredia, fluid resuscitation and calcitonin  potassium down to 10.3 -Continue to follow trend and decide the need of further evaluation to rule out underlying malignancy.  2-acute kidney injury -Appears to be prerenal in the setting of dehydration and continue use of nephrotoxic agents. -After fluid resuscitation renal function is back to normal; Patient encouraged to keep himself well hydrated -There was no signs of infection in his urinanalysis -Recommending repeat basic metabolic panel to follow renal function and stability.  3-essential hypertension  -blood pressure is stable and well-controlled. -Continue the use of Toprol and doxazosin -Education regarding heart healthy diet has been provided -Will continue holding chlorthalidone, benazepril and amlodipine at time of discharge.  4-HLD -continue statins -Heart healthy diet has been encouraged  5-hypokalemia and hypomagnesemia -Continue daily potassium and magnesium supplementation -Will recommend repeat basic metabolic panel at follow-up visit to reassess electrolytes trend.  6-history of BPH -Continue Cardura -Patient denies urinary retention symptoms.  7-gastroesophageal reflux disease -Continue PPI.  8-type 2 diabetes -Continue outpatient follow-up to adjust hypoglycemic regimen as needed -Will resume the use of metformin and Actos at discharge.  Procedures:  See below for x-ray reports.  Consultations:  None  Discharge Exam: Vitals:   09/21/18 0516 09/21/18 0752  BP: (!) 101/59   Pulse: 78   Resp: 17   Temp: 98.5 F (36.9 C)   SpO2: 93% 94%   General exam: Alert, awake, oriented x 3; reports still feeling slightly weak, no chest pain, no shortness of breath, no nausea, no vomiting.  Patient denies having diarrhea or abdominal pain.  Appetite is better. Respiratory system: Clear to auscultation. Respiratory effort normal. Cardiovascular system:RRR. No murmurs, rubs, gallops. Gastrointestinal system: Abdomen is nondistended, soft and  nontender. No organomegaly or masses felt. Normal bowel sounds heard. Central nervous system: Alert and oriented. No focal neurological deficits. Extremities: No C/C/E, +  pedal pulses Skin: No rashes, lesions or ulcers Psychiatry: Judgement and insight appear normal. Mood & affect appropriate.     Discharge Instructions   Discharge Instructions    Diet - low sodium heart healthy   Complete by:  As directed    Discharge instructions   Complete by:  As directed    Take medications as prescribed Keep yourself well-hydrated Arrange follow-up with PCP in 10 days Follow heart healthy diet     Allergies as of 09/21/2018   No Known Allergies     Medication List    STOP taking these medications   amLODipine-benazepril 10-40 MG capsule Commonly known as:  LOTREL   chlorthalidone 25 MG tablet Commonly known as:  HYGROTON   furosemide 40 MG tablet Commonly known as:  LASIX   sildenafil 50 MG tablet Commonly known as:  VIAGRA     TAKE these medications   doxazosin 4 MG tablet Commonly known as:  CARDURA Take 1 tablet by mouth  daily   magnesium oxide 400 MG tablet Commonly known as:  MAG-OX Take 1 tablet (400 mg total) by mouth 2 (two) times daily. What changed:  when to take this   meloxicam 15 MG tablet Commonly known as:  MOBIC Take 15 mg by mouth daily.   metFORMIN 1000 MG tablet Commonly known as:  GLUCOPHAGE Take 1 tablet by mouth 2 (two) times daily.   methocarbamol 500 MG tablet Commonly known as:  Robaxin-750 Take 1 tablet (500 mg total) by mouth 4 (four) times daily.   metoprolol succinate 100 MG 24 hr tablet Commonly known as:  TOPROL-XL Take 1 tablet by mouth  daily with or immediatley  following a meal   NexIUM 40 MG capsule Generic drug:  esomeprazole Take 40 mg by mouth daily.   oxyCODONE-acetaminophen 5-325 MG tablet Commonly known as:  PERCOCET/ROXICET Take 1 tablet by mouth every 12 (twelve) hours as needed for severe pain. What changed:   Another medication with the same name was removed. Continue taking this medication, and follow the directions you see here.   pioglitazone 15 MG tablet Commonly known as:  ACTOS Take 1 tablet by mouth daily.   potassium chloride SA 20 MEQ tablet Commonly known as:  K-DUR Take 2 tablets (40 mEq total) by mouth daily. Start taking on:  Sep 22, 2018   pravastatin 80 MG tablet Commonly known as:  PRAVACHOL Take 1 tablet by mouth  every evening   Vitamin D 1000 units capsule Take 1,000 Units by mouth daily.      No Known Allergies Follow-up Information    [facility] Follow [facility].   Specialty:  Home Health Services Why:   PT       Jani Gravel, MD. Schedule an appointment as soon as possible for a visit in 10 day(s).   Specialty:  Internal Medicine Contact information: [address] [place] [address] [phone]           The results of significant diagnostics from this hospitalization (including imaging, microbiology, ancillary and laboratory) are listed below for reference.    Significant Diagnostic Studies: Dg Knee Complete 4 Views Left  Result Date: 09/17/2018 CLINICAL DATA:  Knee pain post fall, weakness, difficulty moving EXAM: LEFT KNEE - COMPLETE 4+ VIEW COMPARISON:  None FINDINGS: Osseous demineralization. Diffuse joint space narrowing greatest at lateral compartment. Chondrocalcinosis question CPPD. Anterior soft tissue swelling especially infrapatellar. No acute fracture, dislocation or bone destruction. Minimal joint effusion. IMPRESSION: Degenerative  changes and question CPPD LEFT knee. Minimal joint effusion. No acute osseous abnormalities. Electronically Signed   By: Lavonia Dana M.D.   On: 09/17/2018 23:57   Labs: Basic Metabolic Panel: Recent Labs  Lab 09/17/18 2057 09/17/18 2222 09/18/18 0805 09/19/18 1027 09/20/18 0620 09/21/18 0506  NA 136  --  137 142 138 138  K 2.6* 2.9* 3.0* 3.1* 2.9* 3.3*  CL 91*  --  95*  97* 99 102  CO2 30  --  29 31 29 25   GLUCOSE 88  --  90 106* 97 74  BUN 26*  --  26* 20 18 16   CREATININE 2.09*  --  1.54* 1.21 1.14 1.04  CALCIUM 14.3* 14.5* 14.0* 13.4* 11.7* 10.3  MG  --  1.1* 1.4* 1.5* 1.1* 1.1*  PHOS  --  2.4*  --   --   --   --    Liver Function Tests: Recent Labs  Lab 09/17/18 2057 09/18/18 0805  AST 21 19  ALT 20 20  ALKPHOS 80 79  BILITOT 1.1 1.3*  PROT 7.0 6.9  ALBUMIN 3.5 3.4*   CBC: Recent Labs  Lab 09/17/18 2057 09/18/18 0805  WBC 10.1 9.4  NEUTROABS 7.5  --   HGB 13.9 14.1  HCT 43.8 43.9  MCV 94.4 94.6  PLT 293 276   CBG: Recent Labs  Lab 09/20/18 1108 09/20/18 1617 09/20/18 2100 09/21/18 0728 09/21/18 1106  GLUCAP 97 84 75 91 97    Signed:  Barton Dubois MD.  [facility] 09/21/2018, 1:28 PM

## 2018-09-21 NOTE — Care Management Important Message (Signed)
Important Message  Patient Details  Name: Sean [name] MRN: [id number] Date of Birth: 01-13-1951   Medicare Important Message Given:  Yes    [name] 09/21/2018, 1:32 PM

## 2018-09-21 NOTE — TOC Initial Note (Addendum)
Transition of Care [facility]) - Initial/Assessment Note    Patient Details  Name: Sean [name] MRN: [id number] Date of Birth: [birth year]  Transition of Care [facility]) CM/SW Contact:    Boneta Lucks, RN Phone Number: 09/21/2018, 10:05 AM  Clinical Narrative:    Patient admitted for hypercalcemia,  PT recommended Home Health for in home PT. Patient is agreeable and has no preference of agency.  Sent referral to [facility]. Spoke with [name] to make him aware that home health would be coming out to see his father. Son was very appreciative for the services to be offered.              Expected Discharge Plan: [place]     Patient Goals and CMS Choice Patient states their goals for this hospitalization and ongoing recovery are:: Patient wishes to go home with home health care. CMS Medicare.gov Compare Post Acute Care list provided to:: Patient Choice offered to / list presented to : Patient  Expected Discharge Plan and Services Expected Discharge Plan: [place]   Discharge Planning Services: CM Consult Post Acute Care Choice: [place]: PT [facility] Agency: [place] ([place]) Date [place]: 09/21/18 Time [place]: 4401 Representative spoke with at [place]: [place] Arrangements/Services     Patient language and need for interpreter reviewed:: Yes Do you feel safe going back to the place where you live?: Yes      Need for Family Participation in Patient Care: Yes (Comment) Care giver support system in place?: Yes (comment)   Criminal Activity/Legal Involvement Pertinent to Current Situation/Hospitalization: No - Comment as needed  Activities of Daily Living Home Assistive Devices/Equipment: Cane (specify quad or straight), Walker (specify type) ADL Screening (condition at time of admission) Patient's cognitive ability  adequate to safely complete daily activities?: Yes Is the patient deaf or have difficulty hearing?: No Does the patient have difficulty seeing, even when wearing glasses/contacts?: No Does the patient have difficulty concentrating, remembering, or making decisions?: No Patient able to express need for assistance with ADLs?: No Does the patient have difficulty dressing or bathing?: No Independently performs ADLs?: Yes (appropriate for developmental age) Does the patient have difficulty walking or climbing stairs?: Yes Weakness of Legs: Both Weakness of Arms/Hands: Both  Permission Sought/Granted Permission sought to share information with : Case Manager Permission granted to share information with : Yes, Verbal Permission Granted  Share Information with NAME: Romualdo Bolk  Permission granted to share info w AGENCY: [place]        Emotional Assessment     Affect (typically observed): Accepting, Pleasant Orientation: : Oriented to Self, Oriented to Place, Oriented to  Time, Oriented to Situation Alcohol / Substance Use: Not Applicable Psych Involvement: No (comment)  Admission diagnosis:  Hypercalcemia [E83.52] Hypokalemia [E87.6] Generalized weakness [R53.1] Patient Active Problem List   Diagnosis Date Noted  . Hypercalcemia 09/18/2018  . Diarrhea 01/26/2016  . Nausea with vomiting 01/26/2016  . History of adenomatous polyp of colon 01/26/2016  . Encounter for screening colonoscopy 07/06/2015  . Spondylolisthesis of lumbar region 08/17/2012  . Hypokalemia 12/17/2010  . DJD (degenerative joint disease)   . Diabetes mellitus type II 01/25/2010  . Hyperlipidemia 01/25/2010  . Tobacco  abuse 01/25/2010  . Hypertension 05/29/2009   PCP:  Jani Gravel, MD Pharmacy:   [place], [place] [address] Phone: [phone] Fax: [phone]  [place], [place] [facility] [address] [place] [address] [place] [address] Phone: [phone] Fax: [phone]         Readmission Risk Interventions No flowsheet data found.

## 2018-09-21 NOTE — Plan of Care (Signed)
  Problem: Acute Rehab PT Goals(only PT should resolve) Goal: Pt Will Go Supine/Side To Sit Outcome: Progressing Flowsheets (Taken 09/21/2018 1012) Pt will go Supine/Side to Sit: with modified independence Goal: Patient Will Transfer Sit To/From Stand Outcome: Progressing Flowsheets (Taken 09/21/2018 1012) Patient will transfer sit to/from stand: with supervision Goal: Pt Will Transfer Bed To Chair/Chair To Bed Outcome: Progressing Flowsheets (Taken 09/21/2018 1012) Pt will Transfer Bed to Chair/Chair to Bed: with supervision Goal: Pt Will Ambulate Outcome: Progressing Flowsheets (Taken 09/21/2018 1012) Pt will Ambulate: > 125 feet; with rolling walker; with cane; with supervision   10:13 AM, 09/21/18 Lonell Grandchild, MPT Physical Therapist with [facility] 336 [phone] office [phone] mobile phone

## 2018-09-21 NOTE — Evaluation (Signed)
Physical Therapy Evaluation Patient Details Name: Sean [name] MRN: [id number] DOB: 1950/06/22 Today's Date: 09/21/2018   History of Present Illness  Sean [name]  is a 68 y.o. male, with history of degenerative joint disease, chronic back pain, hypertension, hyperlipidemia, diabetes mellitus type 2 came to ED with generalized weakness.  Patient says he fell as he felt weak injured his left knee.  He denies passing out.  Denies chest pain or shortness of breath.  Complains of nausea and vomiting.  Patient also says that he has poor appetite for past few days.  He has been feeling fatigue with generalized weakness for past few weeks.    Clinical Impression  Patient demonstrates slightly labored movement for sitting up at bedside, sit to standing and unsteady on feet when taking steps without using an AD.  Patient safer using RW and able to ambulate in room and hallways without loss of balance, requires increased time for making turns and tolerated sitting up in chair after therapy.  Patient will benefit from continued physical therapy in hospital and recommended venue below to increase strength, balance, endurance for safe ADLs and gait.    Follow Up Recommendations Home health PT;Supervision - Intermittent    Equipment Recommendations  None recommended by PT    Recommendations for Other Services       Precautions / Restrictions Precautions Precautions: Fall Restrictions Weight Bearing Restrictions: No      Mobility  Bed Mobility Overal bed mobility: Needs Assistance Bed Mobility: Supine to Sit     Supine to sit: Supervision     General bed mobility comments: increased time  Transfers Overall transfer level: Needs assistance Equipment used: Rolling walker (2 wheeled) Transfers: Sit to/from [facility] Sit to Stand: Min guard Stand pivot transfers: Min guard       General transfer comment: slightly labored movement, tends to lean on nearby objects for  suppor when not using an AD, safer with RW  Ambulation/Gait Ambulation/Gait assistance: Min guard Gait Distance (Feet): 100 Feet Assistive device: Rolling walker (2 wheeled) Gait Pattern/deviations: Decreased step length - right;Decreased step length - left;Decreased stride length Gait velocity: decreased   General Gait Details: slightly labored cadence, required use of RW for safety, no loss of balance ambulating in room and hallway  Stairs            Wheelchair Mobility    Modified Rankin (Stroke Patients Only)       Balance Overall balance assessment: Needs assistance Sitting-balance support: Feet supported;No upper extremity supported Sitting balance-Leahy Scale: Good     Standing balance support: During functional activity;No upper extremity supported Standing balance-Leahy Scale: Poor Standing balance comment: fair/poor without AD, fair using RW                             Pertinent Vitals/Pain Pain Assessment: No/denies pain    Home Living Family/patient expects to be discharged to:: Private residence Living Arrangements: Alone Available Help at Discharge: Family;Friend(s);Available PRN/intermittently Type of Home: House Home Access: Stairs to enter Entrance Stairs-Rails: Right;Left;Can reach both Entrance Stairs-Number of Steps: 4 Home Layout: One level Home Equipment: Cane - single point;Walker - 2 wheels;Bedside commode;Shower seat;Wheelchair - manual      Prior Function Level of Independence: Independent with assistive device(s)         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     Hand Dominance        Extremity/Trunk Assessment   Upper  Extremity Assessment Upper Extremity Assessment: Overall WFL for tasks assessed    Lower Extremity Assessment Lower Extremity Assessment: Generalized weakness    Cervical / Trunk Assessment Cervical / Trunk Assessment: Normal  Communication   Communication: No difficulties  Cognition  Arousal/Alertness: Awake/alert Behavior During Therapy: WFL for tasks assessed/performed Overall Cognitive Status: Within Functional Limits for tasks assessed                                        General Comments      Exercises     Assessment/Plan    PT Assessment Patient needs continued PT services  PT Problem List Decreased strength;Decreased activity tolerance;Decreased mobility;Decreased balance       PT Treatment Interventions Therapeutic exercise;Gait training;Stair training;Functional mobility training;Therapeutic activities;Patient/family education    PT Goals (Current goals can be found in the Care Plan section)  Acute Rehab PT Goals Patient Stated Goal: return home with friends and family to assist PT Goal Formulation: With patient Time For Goal Achievement: 09/25/18 Potential to Achieve Goals: Good    Frequency Min 3X/week   Barriers to discharge        Co-evaluation               AM-PAC PT "6 Clicks" Mobility  Outcome Measure Help needed turning from your back to your side while in a flat bed without using bedrails?: None Help needed moving from lying on your back to sitting on the side of a flat bed without using bedrails?: None Help needed moving to and from a bed to a chair (including a wheelchair)?: A Little Help needed standing up from a chair using your arms (e.g., wheelchair or bedside chair)?: A Little Help needed to walk in hospital room?: A Little Help needed climbing 3-5 steps with a railing? : A Little 6 Click Score: 20    End of Session Equipment Utilized During Treatment: Gait belt Activity Tolerance: Patient tolerated treatment well;Patient limited by fatigue Patient left: in chair;with call bell/phone within reach Nurse Communication: Mobility status PT Visit Diagnosis: Unsteadiness on feet (R26.81);Other abnormalities of gait and mobility (R26.89);Muscle weakness (generalized) (M62.81)    Time: 3007-6226 PT  Time Calculation (min) (ACUTE ONLY): 30 min   Charges:   PT Evaluation $PT Eval Moderate Complexity: 1 Mod PT Treatments $Therapeutic Activity: 23-37 mins        10:10 AM, 09/21/18 Lonell Grandchild, MPT Physical Therapist with [facility] [place] [phone] office [phone] mobile phone

## 2018-10-27 ENCOUNTER — Encounter (HOSPITAL_COMMUNITY): Payer: Self-pay | Admitting: Emergency Medicine

## 2018-10-27 ENCOUNTER — Emergency Department (HOSPITAL_COMMUNITY): Payer: Medicare Other

## 2018-10-27 ENCOUNTER — Other Ambulatory Visit: Payer: Self-pay

## 2018-10-27 ENCOUNTER — Inpatient Hospital Stay (HOSPITAL_COMMUNITY)
Admission: EM | Admit: 2018-10-27 | Discharge: 2018-11-18 | DRG: 871 | Disposition: E | Payer: Medicare Other | Attending: Critical Care Medicine | Admitting: Critical Care Medicine

## 2018-10-27 DIAGNOSIS — K760 Fatty (change of) liver, not elsewhere classified: Secondary | ICD-10-CM | POA: Diagnosis present

## 2018-10-27 DIAGNOSIS — N179 Acute kidney failure, unspecified: Secondary | ICD-10-CM | POA: Diagnosis present

## 2018-10-27 DIAGNOSIS — I5032 Chronic diastolic (congestive) heart failure: Secondary | ICD-10-CM | POA: Diagnosis present

## 2018-10-27 DIAGNOSIS — E785 Hyperlipidemia, unspecified: Secondary | ICD-10-CM | POA: Diagnosis present

## 2018-10-27 DIAGNOSIS — E1140 Type 2 diabetes mellitus with diabetic neuropathy, unspecified: Secondary | ICD-10-CM | POA: Diagnosis present

## 2018-10-27 DIAGNOSIS — L8990 Pressure ulcer of unspecified site, unspecified stage: Secondary | ICD-10-CM

## 2018-10-27 DIAGNOSIS — I82412 Acute embolism and thrombosis of left femoral vein: Secondary | ICD-10-CM | POA: Diagnosis present

## 2018-10-27 DIAGNOSIS — C3431 Malignant neoplasm of lower lobe, right bronchus or lung: Secondary | ICD-10-CM | POA: Diagnosis present

## 2018-10-27 DIAGNOSIS — J9690 Respiratory failure, unspecified, unspecified whether with hypoxia or hypercapnia: Secondary | ICD-10-CM | POA: Diagnosis present

## 2018-10-27 DIAGNOSIS — G9340 Encephalopathy, unspecified: Secondary | ICD-10-CM | POA: Diagnosis present

## 2018-10-27 DIAGNOSIS — Z978 Presence of other specified devices: Secondary | ICD-10-CM

## 2018-10-27 DIAGNOSIS — E1165 Type 2 diabetes mellitus with hyperglycemia: Secondary | ICD-10-CM | POA: Diagnosis not present

## 2018-10-27 DIAGNOSIS — Y92012 Bathroom of single-family (private) house as the place of occurrence of the external cause: Secondary | ICD-10-CM | POA: Diagnosis not present

## 2018-10-27 DIAGNOSIS — E440 Moderate protein-calorie malnutrition: Secondary | ICD-10-CM | POA: Insufficient documentation

## 2018-10-27 DIAGNOSIS — N182 Chronic kidney disease, stage 2 (mild): Secondary | ICD-10-CM | POA: Diagnosis present

## 2018-10-27 DIAGNOSIS — N400 Enlarged prostate without lower urinary tract symptoms: Secondary | ICD-10-CM | POA: Diagnosis present

## 2018-10-27 DIAGNOSIS — I361 Nonrheumatic tricuspid (valve) insufficiency: Secondary | ICD-10-CM | POA: Diagnosis not present

## 2018-10-27 DIAGNOSIS — I130 Hypertensive heart and chronic kidney disease with heart failure and stage 1 through stage 4 chronic kidney disease, or unspecified chronic kidney disease: Secondary | ICD-10-CM | POA: Diagnosis present

## 2018-10-27 DIAGNOSIS — A419 Sepsis, unspecified organism: Principal | ICD-10-CM | POA: Diagnosis present

## 2018-10-27 DIAGNOSIS — U071 COVID-19: Secondary | ICD-10-CM | POA: Diagnosis not present

## 2018-10-27 DIAGNOSIS — S0003XA Contusion of scalp, initial encounter: Secondary | ICD-10-CM | POA: Diagnosis present

## 2018-10-27 DIAGNOSIS — E1122 Type 2 diabetes mellitus with diabetic chronic kidney disease: Secondary | ICD-10-CM | POA: Diagnosis present

## 2018-10-27 DIAGNOSIS — F17210 Nicotine dependence, cigarettes, uncomplicated: Secondary | ICD-10-CM | POA: Diagnosis present

## 2018-10-27 DIAGNOSIS — Z823 Family history of stroke: Secondary | ICD-10-CM

## 2018-10-27 DIAGNOSIS — Z66 Do not resuscitate: Secondary | ICD-10-CM | POA: Diagnosis not present

## 2018-10-27 DIAGNOSIS — K219 Gastro-esophageal reflux disease without esophagitis: Secondary | ICD-10-CM | POA: Diagnosis present

## 2018-10-27 DIAGNOSIS — W0110XA Fall on same level from slipping, tripping and stumbling with subsequent striking against unspecified object, initial encounter: Secondary | ICD-10-CM | POA: Diagnosis present

## 2018-10-27 DIAGNOSIS — E870 Hyperosmolality and hypernatremia: Secondary | ICD-10-CM | POA: Diagnosis present

## 2018-10-27 DIAGNOSIS — E872 Acidosis: Secondary | ICD-10-CM | POA: Diagnosis present

## 2018-10-27 DIAGNOSIS — D649 Anemia, unspecified: Secondary | ICD-10-CM | POA: Diagnosis present

## 2018-10-27 DIAGNOSIS — J189 Pneumonia, unspecified organism: Secondary | ICD-10-CM | POA: Diagnosis not present

## 2018-10-27 DIAGNOSIS — I824Z2 Acute embolism and thrombosis of unspecified deep veins of left distal lower extremity: Secondary | ICD-10-CM | POA: Diagnosis present

## 2018-10-27 DIAGNOSIS — C7951 Secondary malignant neoplasm of bone: Secondary | ICD-10-CM | POA: Diagnosis present

## 2018-10-27 DIAGNOSIS — Z515 Encounter for palliative care: Secondary | ICD-10-CM | POA: Diagnosis not present

## 2018-10-27 DIAGNOSIS — J9601 Acute respiratory failure with hypoxia: Secondary | ICD-10-CM | POA: Diagnosis present

## 2018-10-27 DIAGNOSIS — R402112 Coma scale, eyes open, never, at arrival to emergency department: Secondary | ICD-10-CM | POA: Diagnosis present

## 2018-10-27 DIAGNOSIS — I82431 Acute embolism and thrombosis of right popliteal vein: Secondary | ICD-10-CM | POA: Diagnosis present

## 2018-10-27 DIAGNOSIS — J9600 Acute respiratory failure, unspecified whether with hypoxia or hypercapnia: Secondary | ICD-10-CM

## 2018-10-27 DIAGNOSIS — I82442 Acute embolism and thrombosis of left tibial vein: Secondary | ICD-10-CM | POA: Diagnosis present

## 2018-10-27 DIAGNOSIS — Z7984 Long term (current) use of oral hypoglycemic drugs: Secondary | ICD-10-CM

## 2018-10-27 DIAGNOSIS — J80 Acute respiratory distress syndrome: Secondary | ICD-10-CM | POA: Diagnosis not present

## 2018-10-27 DIAGNOSIS — I82432 Acute embolism and thrombosis of left popliteal vein: Secondary | ICD-10-CM | POA: Diagnosis present

## 2018-10-27 DIAGNOSIS — Z791 Long term (current) use of non-steroidal anti-inflammatories (NSAID): Secondary | ICD-10-CM

## 2018-10-27 DIAGNOSIS — D696 Thrombocytopenia, unspecified: Secondary | ICD-10-CM | POA: Diagnosis present

## 2018-10-27 DIAGNOSIS — R402332 Coma scale, best motor response, abnormal, at arrival to emergency department: Secondary | ICD-10-CM | POA: Diagnosis present

## 2018-10-27 DIAGNOSIS — Z7189 Other specified counseling: Secondary | ICD-10-CM | POA: Diagnosis not present

## 2018-10-27 DIAGNOSIS — Z96643 Presence of artificial hip joint, bilateral: Secondary | ICD-10-CM | POA: Diagnosis present

## 2018-10-27 DIAGNOSIS — Z20828 Contact with and (suspected) exposure to other viral communicable diseases: Secondary | ICD-10-CM | POA: Diagnosis present

## 2018-10-27 DIAGNOSIS — I248 Other forms of acute ischemic heart disease: Secondary | ICD-10-CM | POA: Diagnosis present

## 2018-10-27 DIAGNOSIS — R4182 Altered mental status, unspecified: Secondary | ICD-10-CM

## 2018-10-27 DIAGNOSIS — C61 Malignant neoplasm of prostate: Secondary | ICD-10-CM | POA: Diagnosis present

## 2018-10-27 DIAGNOSIS — M1990 Unspecified osteoarthritis, unspecified site: Secondary | ICD-10-CM | POA: Diagnosis present

## 2018-10-27 DIAGNOSIS — R402232 Coma scale, best verbal response, inappropriate words, at arrival to emergency department: Secondary | ICD-10-CM | POA: Diagnosis present

## 2018-10-27 LAB — COMPREHENSIVE METABOLIC PANEL
ALT: 60 U/L — ABNORMAL HIGH (ref 0–44)
AST: 56 U/L — ABNORMAL HIGH (ref 15–41)
Albumin: 2.9 g/dL — ABNORMAL LOW (ref 3.5–5.0)
Alkaline Phosphatase: 95 U/L (ref 38–126)
Anion gap: 20 — ABNORMAL HIGH (ref 5–15)
BUN: 108 mg/dL — ABNORMAL HIGH (ref 8–23)
CO2: 21 mmol/L — ABNORMAL LOW (ref 22–32)
Calcium: 13.6 mg/dL (ref 8.9–10.3)
Chloride: 108 mmol/L (ref 98–111)
Creatinine, Ser: 4.75 mg/dL — ABNORMAL HIGH (ref 0.61–1.24)
GFR calc Af Amer: 14 mL/min — ABNORMAL LOW (ref >60)
GFR calc non Af Amer: 12 mL/min — ABNORMAL LOW (ref >60)
Glucose, Bld: 182 mg/dL — ABNORMAL HIGH (ref 70–99)
Potassium: 4.3 mmol/L (ref 3.5–5.1)
Sodium: 149 mmol/L — ABNORMAL HIGH (ref 135–145)
Total Bilirubin: 2.2 mg/dL — ABNORMAL HIGH (ref 0.3–1.2)
Total Protein: 7.1 g/dL (ref 6.5–8.1)

## 2018-10-27 LAB — URINALYSIS, ROUTINE W REFLEX MICROSCOPIC
Bacteria, UA: NONE SEEN
Glucose, UA: NEGATIVE mg/dL
Ketones, ur: NEGATIVE mg/dL
Leukocytes,Ua: NEGATIVE
Nitrite: NEGATIVE
Protein, ur: 30 mg/dL — AB
Specific Gravity, Urine: 1.021 (ref 1.005–1.030)
pH: 5 (ref 5.0–8.0)

## 2018-10-27 LAB — PHOSPHORUS: Phosphorus: 4.9 mg/dL — ABNORMAL HIGH (ref 2.5–4.6)

## 2018-10-27 LAB — CBC WITH DIFFERENTIAL/PLATELET
Abs Immature Granulocytes: 0.27 10*3/uL — ABNORMAL HIGH (ref 0.00–0.07)
Basophils Absolute: 0 10*3/uL (ref 0.0–0.1)
Basophils Relative: 0 %
Eosinophils Absolute: 0 10*3/uL (ref 0.0–0.5)
Eosinophils Relative: 0 %
HCT: 47.5 % (ref 39.0–52.0)
Hemoglobin: 15.6 g/dL (ref 13.0–17.0)
Immature Granulocytes: 1 %
Lymphocytes Relative: 5 %
Lymphs Abs: 1.1 10*3/uL (ref 0.7–4.0)
MCH: 29.8 pg (ref 26.0–34.0)
MCHC: 32.8 g/dL (ref 30.0–36.0)
MCV: 90.6 fL (ref 80.0–100.0)
Monocytes Absolute: 2.6 10*3/uL — ABNORMAL HIGH (ref 0.1–1.0)
Monocytes Relative: 11 %
Neutro Abs: 20.6 10*3/uL — ABNORMAL HIGH (ref 1.7–7.7)
Neutrophils Relative %: 83 %
Platelets: 253 10*3/uL (ref 150–400)
RBC: 5.24 MIL/uL (ref 4.22–5.81)
RDW: 15.8 % — ABNORMAL HIGH (ref 11.5–15.5)
WBC: 24.7 10*3/uL — ABNORMAL HIGH (ref 4.0–10.5)
nRBC: 0.4 % — ABNORMAL HIGH (ref 0.0–0.2)

## 2018-10-27 LAB — BLOOD GAS, ARTERIAL
Acid-base deficit: 3.4 mmol/L — ABNORMAL HIGH (ref 0.0–2.0)
Bicarbonate: 21.3 mmol/L (ref 20.0–28.0)
FIO2: 100
O2 Saturation: 96.2 %
Patient temperature: 37.6
pCO2 arterial: 42.1 mmHg (ref 32.0–48.0)
pH, Arterial: 7.33 — ABNORMAL LOW (ref 7.350–7.450)
pO2, Arterial: 113 mmHg — ABNORMAL HIGH (ref 83.0–108.0)

## 2018-10-27 LAB — LACTIC ACID, PLASMA
Lactic Acid, Venous: 4.7 mmol/L (ref 0.5–1.9)
Lactic Acid, Venous: 4.5 mmol/L (ref 0.5–1.9)

## 2018-10-27 LAB — BRAIN NATRIURETIC PEPTIDE: B Natriuretic Peptide: 77 pg/mL (ref 0.0–100.0)

## 2018-10-27 LAB — PROTIME-INR
INR: 1.2 (ref 0.8–1.2)
Prothrombin Time: 15.4 seconds — ABNORMAL HIGH (ref 11.4–15.2)

## 2018-10-27 LAB — TROPONIN I: Troponin I: 0.12 ng/mL (ref <0.03)

## 2018-10-27 LAB — MAGNESIUM: Magnesium: 1.8 mg/dL (ref 1.7–2.4)

## 2018-10-27 LAB — SARS CORONAVIRUS 2 BY RT PCR (HOSPITAL ORDER, PERFORMED IN ~~LOC~~ HOSPITAL LAB): SARS Coronavirus 2: NEGATIVE

## 2018-10-27 LAB — ETHANOL: Alcohol, Ethyl (B): <10 mg/dL (ref <10)

## 2018-10-27 LAB — CK: Total CK: 645 U/L — ABNORMAL HIGH (ref 49–397)

## 2018-10-27 LAB — PROCALCITONIN: Procalcitonin: 0.36 ng/mL

## 2018-10-27 MED ORDER — MIDAZOLAM HCL 2 MG/2ML IJ SOLN
2.0000 mg | INTRAMUSCULAR | Status: AC | PRN
  Administered 2018-10-28 (×3): 2 mg via INTRAVENOUS
  Filled 2018-10-27 (×4): qty 2

## 2018-10-27 MED ORDER — NALOXONE HCL 2 MG/2ML IJ SOSY
2.0000 mg | PREFILLED_SYRINGE | Freq: Once | INTRAMUSCULAR | Status: AC
  Administered 2018-10-27: 20:00:00 2 mg via INTRAVENOUS

## 2018-10-27 MED ORDER — ROCURONIUM BROMIDE 50 MG/5ML IV SOLN
100.0000 mg | Freq: Once | INTRAVENOUS | Status: AC
  Administered 2018-10-27: 100 mg via INTRAVENOUS
  Filled 2018-10-27: qty 10

## 2018-10-27 MED ORDER — FENTANYL CITRATE (PF) 100 MCG/2ML IJ SOLN
100.0000 ug | INTRAMUSCULAR | Status: AC | PRN
  Administered 2018-10-28 (×3): 100 ug via INTRAVENOUS
  Filled 2018-10-27 (×4): qty 2

## 2018-10-27 MED ORDER — SODIUM CHLORIDE 0.9 % IV SOLN
2.0000 g | Freq: Once | INTRAVENOUS | Status: AC
  Administered 2018-10-27: 2 g via INTRAVENOUS
  Filled 2018-10-27: qty 2

## 2018-10-27 MED ORDER — SODIUM CHLORIDE 0.9 % IV BOLUS
1000.0000 mL | Freq: Once | INTRAVENOUS | Status: AC
  Administered 2018-10-27: 1000 mL via INTRAVENOUS

## 2018-10-27 MED ORDER — SODIUM CHLORIDE 0.9 % IV BOLUS (SEPSIS)
500.0000 mL | Freq: Once | INTRAVENOUS | Status: AC
  Administered 2018-10-27: 500 mL via INTRAVENOUS

## 2018-10-27 MED ORDER — VANCOMYCIN HCL IN DEXTROSE 1-5 GM/200ML-% IV SOLN
1000.0000 mg | INTRAVENOUS | Status: AC
  Administered 2018-10-27 (×2): 1000 mg via INTRAVENOUS
  Filled 2018-10-27: qty 200

## 2018-10-27 MED ORDER — SODIUM CHLORIDE 0.9 % IV SOLN
2.0000 g | Freq: Two times a day (BID) | INTRAVENOUS | Status: DC
  Administered 2018-10-28: 2 g via INTRAVENOUS
  Filled 2018-10-27: qty 2

## 2018-10-27 MED ORDER — FENTANYL CITRATE (PF) 100 MCG/2ML IJ SOLN
100.0000 ug | INTRAMUSCULAR | Status: DC | PRN
  Administered 2018-10-27 – 2018-10-29 (×4): 100 ug via INTRAVENOUS
  Filled 2018-10-27 (×2): qty 2

## 2018-10-27 MED ORDER — METRONIDAZOLE IN NACL 5-0.79 MG/ML-% IV SOLN
500.0000 mg | Freq: Once | INTRAVENOUS | Status: AC
  Administered 2018-10-27: 500 mg via INTRAVENOUS
  Filled 2018-10-27: qty 100

## 2018-10-27 MED ORDER — MIDAZOLAM HCL 2 MG/2ML IJ SOLN
2.0000 mg | INTRAMUSCULAR | Status: DC | PRN
  Administered 2018-10-27 – 2018-10-29 (×3): 2 mg via INTRAVENOUS
  Filled 2018-10-27 (×2): qty 2

## 2018-10-27 MED ORDER — VANCOMYCIN HCL IN DEXTROSE 1-5 GM/200ML-% IV SOLN
1000.0000 mg | Freq: Once | INTRAVENOUS | Status: DC
  Filled 2018-10-27: qty 200

## 2018-10-27 MED ORDER — ETOMIDATE 2 MG/ML IV SOLN
20.0000 mg | Freq: Once | INTRAVENOUS | Status: AC
  Administered 2018-10-27: 20 mg via INTRAVENOUS

## 2018-10-27 MED ORDER — SODIUM CHLORIDE 0.9 % IV BOLUS (SEPSIS)
1000.0000 mL | Freq: Once | INTRAVENOUS | Status: AC
  Administered 2018-10-27: 1000 mL via INTRAVENOUS

## 2018-10-27 NOTE — Progress Notes (Signed)
Pharmacy Antibiotic Note  Sean [name] is a 68 y.o. male admitted on 11/01/2018 with sepsis.  Pharmacy has been consulted for vancomycin and cefepime  dosing.  Plan:  Start cefepime 2g IV q12h Loading dose:  vancomycin 1g IV q2h x2 total doses (total dose 2g) Maintenance dose: to be determined based on renal fx Goal vancomycin trough range:  15-20  Mcg/mL Pharmacy will continue to monitor renal function, vancomycin troughs as clinically indicated, cultures and patient progress.  Weight: 243 lb 13.3 oz (110.6 kg)  Temp (24hrs), Avg:99.6 F (37.6 C), Min:99.5 F (37.5 C), Max:99.9 F (37.7 C)  Recent Labs  Lab 11/16/2018 2000  WBC 24.7*  CREATININE 4.75*  LATICACIDVEN 4.7*    Estimated Creatinine Clearance: 19.9 mL/min (A) (by C-G formula based on SCr of 4.75 mg/dL (H)).    No Known Allergies  Antimicrobials this admission: metronidazole 6/9>>  x1 dose in ED vancomycin 6/9>>  cefepime 6/9>>   Microbiology results: 6/9 [facility] x2:  pending  6/9 SARS CoV-2: negative  Thank you for allowing pharmacy to be a part of this patient's care.  Despina Pole, Pharm. D. Clinical Pharmacist 10/25/2018 9:38 PM

## 2018-10-27 NOTE — ED Notes (Signed)
ED TO INPATIENT HANDOFF REPORT  ED Nurse Name and Phone #: [phone]  S Name/Age/Gender Sean [name] 68 y.o. male Room/Bed: APA02/APA02  Code Status   Code Status: Prior  Home/SNF/Other Home Patient oriented to: self Is this baseline? No   Triage Complete: Triage complete  Chief Complaint Altered Mental Status  Triage Note Pt was found in bathroom by law enforcement during welfare check. Per ems pt was unresponsive and 78% on room air. Pt is 90% on NRB. Pt was recently treated for uti and hypokalemia.   Allergies No Known Allergies  Level of Care/Admitting Diagnosis ED Disposition    ED Disposition Condition [facility] Area: [place] [[id number]]  Level of Care: ICU [6]  Covid Evaluation: Confirmed COVID Negative  Diagnosis: Respiratory failure [facility]) [[id number]]  Admitting Physician: Garner Nash [[id number]]  Attending Physician: Garner Nash [[id number]]  Estimated length of stay: past midnight tomorrow  Certification:: I certify this patient will need inpatient services for at least 2 midnights  PT Class (Do Not Modify): Inpatient [101]  PT Acc Code (Do Not Modify): Private [1]       B Medical/Surgery History Past Medical History:  Diagnosis Date  . Abnormal EKG    negative stress nuclear-2005; probable inferolateral scarring in 2007- negative EKG at 10 mets   . Bilateral sciatica   . Chronic back pain   . Chronic hip pain, bilateral   . Diabetes mellitus    AODM- no insulin- fasting cbg 100-150  . 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 GERD (gastroesophageal reflux disease)   . H/O hiatal hernia   . HLD (hyperlipidemia)   . HTN (hypertension)   . Hx of dizziness   . Neuropathy   . Obesity   . Paresthesia of foot, bilateral   . Shortness of breath    exertion  . Tobacco abuse    70 pack years   Past Surgical History:  Procedure Laterality Date  . BIOPSY  07/11/2015   Procedure:  BIOPSY;  Surgeon: Danie Binder, MD;  [facility];  Service: Endoscopy;;  . CATARACT EXTRACTION  2011-12   Bilateral  . COLONOSCOPY     At [facility]: polypectomy x 3 via resection, all with fragments of tubular adenoma.  . COLONOSCOPY WITH PROPOFOL N/A 07/11/2015   Procedure: COLONOSCOPY WITH PROPOFOL;  Surgeon: Danie Binder, MD;  Location[facility];  Service: Endoscopy;  Laterality: N/A;  200 - per office, pt can't come earlier  . HERNIA REPAIR Bilateral 1997  . JOINT REPLACEMENT     bilateral hip   . LUMBAR SPINE SURGERY  2004   Laminectomy and fusion  . POLYPECTOMY  07/11/2015   Procedure: POLYPECTOMY biopsy multiple;  Surgeon: Danie Binder, MD;  [facility];  Service: Endoscopy;;  ceacl polyp APC used  . TOTAL HIP ARTHROPLASTY  2005   Left     A IV Location/Drains/Wounds Patient Lines/Drains/Airways Status   Active Line/Drains/Airways    Name:   Placement date:   Placement time:   Site:   Days:   Peripheral IV 10/29/2018 Left Antecubital   11/13/2018    1956    Antecubital   less than 1   Peripheral IV 10/21/2018 Left Hand   11/09/2018    2040    Hand   less than 1   Urethral Catheter Michael White 14 Fr.   11/13/2018  2029    -   less than 1   Airway 8 mm   11/09/2018    2008     less than 1   Incision 08/13/12 Back   08/13/12    0921     2266          Intake/Output Last 24 hours No intake or output data in the 24 hours ending 11/17/2018 2244  Labs/Imaging Results for orders placed or performed during the hospital encounter of 11/06/2018 (from the past 48 hour(s))  Lactic acid, plasma     Status: Abnormal   Collection Time: 11/04/2018  8:00 PM  Result Value Ref Range   Lactic Acid, Venous 4.7 (HH) 0.5 - 1.9 mmol/L    Comment: CRITICAL RESULT CALLED TO, READ BACK BY AND VERIFIED WITH: CRABTREE,B ON 11/11/2018 AT 2100 BY LOY,C Performed at [facility], [address], [place], [place] [address]   Comprehensive metabolic panel     Status: Abnormal    Collection Time: 11/01/2018  8:00 PM  Result Value Ref Range   Sodium 149 (H) 135 - 145 mmol/L   Potassium 4.3 3.5 - 5.1 mmol/L   Chloride 108 98 - 111 mmol/L   CO2 21 (L) 22 - 32 mmol/L   Glucose, Bld 182 (H) 70 - 99 mg/dL   BUN 108 (H) 8 - 23 mg/dL    Comment: RESULTS CONFIRMED BY MANUAL DILUTION   Creatinine, Ser 4.75 (H) 0.61 - 1.24 mg/dL   Calcium 13.6 (HH) 8.9 - 10.3 mg/dL    Comment: CRITICAL RESULT CALLED TO, READ BACK BY AND VERIFIED WITH: CRABTREE,B ON 11/01/2018 AT 2100 BY LOY,C    Total Protein 7.1 6.5 - 8.1 g/dL   Albumin 2.9 (L) 3.5 - 5.0 g/dL   AST 56 (H) 15 - 41 U/L   ALT 60 (H) 0 - 44 U/L   Alkaline Phosphatase 95 38 - 126 U/L   Total Bilirubin 2.2 (H) 0.3 - 1.2 mg/dL   GFR calc non Af Amer 12 (L) >60 mL/min   GFR calc Af Amer 14 (L) >60 mL/min   Anion gap 20 (H) 5 - 15    Comment: Performed at [facility], [address] [place], [place] [address]  CBC WITH DIFFERENTIAL     Status: Abnormal   Collection Time: 11/14/2018  8:00 PM  Result Value Ref Range   WBC 24.7 (H) 4.0 - 10.5 K/uL    Comment: WHITE COUNT CONFIRMED ON SMEAR   RBC 5.24 4.22 - 5.81 MIL/uL   Hemoglobin 15.6 13.0 - 17.0 g/dL   HCT 47.5 39.0 - 52.0 %   MCV 90.6 80.0 - 100.0 fL   MCH 29.8 26.0 - 34.0 pg   MCHC 32.8 30.0 - 36.0 g/dL   RDW 15.8 (H) 11.5 - 15.5 %   Platelets 253 150 - 400 K/uL   nRBC 0.4 (H) 0.0 - 0.2 %   Neutrophils Relative % 83 %   Neutro Abs 20.6 (H) 1.7 - 7.7 K/uL   Lymphocytes Relative 5 %   Lymphs Abs 1.1 0.7 - 4.0 K/uL   Monocytes Relative 11 %   Monocytes Absolute 2.6 (H) 0.1 - 1.0 K/uL   Eosinophils Relative 0 %   Eosinophils Absolute 0.0 0.0 - 0.5 K/uL   Basophils Relative 0 %   Basophils Absolute 0.0 0.0 - 0.1 K/uL   Immature Granulocytes 1 %   Abs Immature Granulocytes 0.27 (H) 0.00 - 0.07 K/uL    Comment: Performed at  [facility], [address], [place], [place] [address]  Blood Culture (routine x 2)     Status: None (Preliminary result)   Collection Time:  11/10/2018  8:00 PM  Result Value Ref Range   Specimen Description LEFT ANTECUBITAL    Special Requests      BOTTLES DRAWN AEROBIC AND ANAEROBIC Blood Culture adequate volume Performed at [facility], [address], [place], [place] [address]    Culture PENDING    Report Status PENDING   Brain natriuretic peptide     Status: None   Collection Time: 11/08/2018  8:00 PM  Result Value Ref Range   B Natriuretic Peptide 77.0 0.0 - 100.0 pg/mL    Comment: Performed at [facility], [address], [place], [place] [address]  Troponin I - Add-On to previous collection     Status: Abnormal   Collection Time: 11/05/2018  8:00 PM  Result Value Ref Range   Troponin I 0.12 (HH) <0.03 ng/mL    Comment: CRITICAL RESULT CALLED TO, READ BACK BY AND VERIFIED WITH: Zana Biancardi,C ON 10/30/2018 AT 2130 BY LOY,C Performed at [facility], [address] [place], [place] [address]   CK     Status: Abnormal   Collection Time: 10/25/2018  8:00 PM  Result Value Ref Range   Total CK 645 (H) 49 - 397 U/L    Comment: Performed at [facility], [address] [place], [place] [address]  SARS Coronavirus 2 ([place]- Performed in [place] hospital lab), Hosp Order     Status: None   Collection Time: 11/10/2018  8:16 PM  Result Value Ref Range   SARS Coronavirus 2 NEGATIVE NEGATIVE    Comment: (NOTE) If result is NEGATIVE SARS-CoV-2 target nucleic acids are NOT DETECTED. The SARS-CoV-2 RNA is generally detectable in upper and lower  respiratory specimens during the acute phase of infection. The lowest  concentration of SARS-CoV-2 viral copies this assay can detect is 250  copies / mL. A negative result does not preclude SARS-CoV-2 infection  and should not be used as the sole basis for treatment or other  patient management decisions.  A negative result may occur with  improper specimen collection / handling, submission of specimen other  than nasopharyngeal swab, presence of viral mutation(s) within the  areas targeted  by this assay, and inadequate number of viral copies  (<250 copies / mL). A negative result must be combined with clinical  observations, patient history, and epidemiological information. If result is POSITIVE SARS-CoV-2 target nucleic acids are DETECTED. The SARS-CoV-2 RNA is generally detectable in upper and lower  respiratory specimens dur ing the acute phase of infection.  Positive  results are indicative of active infection with SARS-CoV-2.  Clinical  correlation with patient history and other diagnostic information is  necessary to determine patient infection status.  Positive results do  not rule out bacterial infection or co-infection with other viruses. If result is PRESUMPTIVE POSTIVE SARS-CoV-2 nucleic acids MAY BE PRESENT.   A presumptive positive result was obtained on the submitted specimen  and confirmed on repeat testing.  While 2019 novel coronavirus  (SARS-CoV-2) nucleic acids may be present in the submitted sample  additional confirmatory testing may be necessary for epidemiological  and / or clinical management purposes  to differentiate between  SARS-CoV-2 and other Sarbecovirus currently known to infect humans.  If clinically indicated additional testing with an alternate test  methodology [phone]) is advised. The SARS-CoV-2 RNA is generally  detectable in upper and  lower respiratory sp ecimens during the acute  phase of infection. The expected result is Negative. Fact Sheet for Patients:  StrictlyIdeas.no Fact Sheet for Healthcare Providers: BankingDealers.co.za This test is not yet approved or cleared by the [place] FDA and has been authorized for detection and/or diagnosis of SARS-CoV-2 by FDA under an Emergency Use Authorization (EUA).  This EUA will remain in effect (meaning this test can be used) for the duration of the COVID-19 declaration under Section 564(b)(1) of the Act, 21 U.S.C. section  360bbb-3(b)(1), unless the authorization is terminated or revoked sooner. Performed at [facility], [address] [place], [place] [address]   Blood Culture (routine x 2)     Status: None (Preliminary result)   Collection Time: 11/17/2018  8:38 PM  Result Value Ref Range   Specimen Description BLOOD LEFT HAND    Special Requests      BOTTLES DRAWN AEROBIC AND ANAEROBIC Blood Culture adequate volume Performed at [facility], [address] [place], [place] [address]    Culture PENDING    Report Status PENDING   Urinalysis, Routine w reflex microscopic     Status: Abnormal   Collection Time: 11/08/2018  9:00 PM  Result Value Ref Range   Color, Urine AMBER (A) YELLOW    Comment: BIOCHEMICALS MAY BE AFFECTED BY COLOR   APPearance CLOUDY (A) CLEAR   Specific Gravity, Urine 1.021 1.005 - 1.030   pH 5.0 5.0 - 8.0   Glucose, UA NEGATIVE NEGATIVE mg/dL   Hgb urine dipstick SMALL (A) NEGATIVE   Bilirubin Urine SMALL (A) NEGATIVE   Ketones, ur NEGATIVE NEGATIVE mg/dL   Protein, ur 30 (A) NEGATIVE mg/dL   Nitrite NEGATIVE NEGATIVE   Leukocytes,Ua NEGATIVE NEGATIVE   RBC / HPF 0-5 0 - 5 RBC/hpf   WBC, UA 0-5 0 - 5 WBC/hpf   Bacteria, UA NONE SEEN NONE SEEN   Squamous Epithelial / LPF 0-5 0 - 5   Mucus PRESENT     Comment: Performed at [facility], [address] [place], [place] [address]  Blood gas, arterial     Status: Abnormal   Collection Time: 11/09/2018  9:11 PM  Result Value Ref Range   FIO2 100.00    pH, Arterial 7.330 (L) 7.350 - 7.450   pCO2 arterial 42.1 32.0 - 48.0 mmHg   pO2, Arterial 113 (H) 83.0 - 108.0 mmHg   Bicarbonate 21.3 20.0 - 28.0 mmol/L   Acid-base deficit 3.4 (H) 0.0 - 2.0 mmol/L   O2 Saturation 96.2 %   Patient temperature 37.6    Allens test (pass/fail) PASS PASS    Comment: Performed at [facility], [address] [place], [place] [address]  Lactic acid, plasma     Status: Abnormal   Collection Time: 10/29/2018 10:03 PM  Result Value Ref Range   Lactic  Acid, Venous 4.5 (HH) 0.5 - 1.9 mmol/L    Comment: CRITICAL RESULT CALLED TO, READ BACK BY AND VERIFIED WITH: Jeramine Delis,C ON 11/04/2018 AT 2230 BY LOY,C Performed at [facility], [address], [place], [place] [address]    Ct Head Wo Contrast  Result Date: 11/14/2018 CLINICAL DATA:  Altered level of consciousness. EXAM: CT HEAD WITHOUT CONTRAST TECHNIQUE: Contiguous axial images were obtained from the base of the skull through the vertex without intravenous contrast. COMPARISON:  None. FINDINGS: Brain: No evidence of acute infarction, hemorrhage, hydrocephalus, extra-axial collection or mass lesion/mass effect. Vascular: No hyperdense vessel or unexpected calcification. Skull: Normal. Negative for fracture or  focal lesion. Sinuses/Orbits: Bilateral ethmoid and frontal sinusitis is noted. Other: Moderate right parietal scalp hematoma is noted. IMPRESSION: Moderate right parietal scalp hematoma. Bilateral ethmoid and frontal sinusitis. No acute intracranial abnormality seen. Electronically Signed   By: Marijo Conception M.D.   On: 10/29/2018 21:11   Dg Chest Portable 1 View  Result Date: 11/15/2018 CLINICAL DATA:  Found unresponsive with low oxygen saturation. EXAM: PORTABLE CHEST 1 VIEW COMPARISON:  04/09/2013 FINDINGS: Endotracheal tube tip is 4 cm above the carina. Orogastric or nasogastric tube enters the abdomen. The left lung is clear. There is abnormal opacity in the medial right lower lung. This could represent pneumonia or a mass lesion. No apparent effusion. IMPRESSION: Endotracheal tube in good position. Abnormal opacity in the right medial lower chest could be pneumonia or a mass lesion. Electronically Signed   By: Nelson Chimes M.D.   On: 10/23/2018 20:33    Pending Labs Unresulted Labs (From admission, onward)    Start     Ordered   10/28/18 0500  CBC  Every Mon-Wed-Fri (0500),   R     11/15/2018 2144   10/28/18 6948  Basic metabolic panel  Every Mon-Wed-Fri (0500),   R     11/12/2018 2144           Vitals/Pain Today's Vitals   11/02/2018 2130 11/17/2018 2200 11/11/2018 2230 11/02/2018 2240  BP: (!) 141/93 (!) 134/105 (!) 137/106 (!) 122/96  Pulse: (!) 131 (!) 128 (!) 123   Resp: (!) 9     Temp: 99.3 F (37.4 C) 98.6 F (37 C) 98.1 F (36.7 C) 97.9 F (36.6 C)  SpO2: 98% 98% 98%   Weight:      PainSc:        Isolation Precautions Airborne and Contact precautions  Medications Medications  fentaNYL (SUBLIMAZE) injection 100 mcg (has no administration in time range)  fentaNYL (SUBLIMAZE) injection 100 mcg (100 mcg Intravenous Given 10/21/2018 2003)  midazolam (VERSED) injection 2 mg (has no administration in time range)  midazolam (VERSED) injection 2 mg (2 mg Intravenous Given 10/24/2018 2003)  vancomycin (VANCOCIN) IVPB 1000 mg/200 mL premix (1,000 mg Intravenous New Bag/Given 10/25/2018 2238)  ceFEPIme (MAXIPIME) 2 g in sodium chloride 0.9 % 100 mL IVPB (has no administration in time range)  sodium chloride 0.9 % bolus 1,000 mL (1,000 mLs Intravenous New Bag/Given 11/12/2018 2237)    And  sodium chloride 0.9 % bolus 500 mL (has no administration in time range)  naloxone [facility]) injection 2 mg (2 mg Intravenous Given 10/21/2018 1950)  etomidate (AMIDATE) injection 20 mg (20 mg Intravenous Given 11/16/2018 2003)  rocuronium (ZEMURON) injection 100 mg (100 mg Intravenous Given 11/06/2018 2004)  sodium chloride 0.9 % bolus 1,000 mL (0 mLs Intravenous Stopped 11/11/2018 2125)  ceFEPIme (MAXIPIME) 2 g in sodium chloride 0.9 % 100 mL IVPB (0 g Intravenous Stopped 10/29/2018 2146)  metroNIDAZOLE (FLAGYL) IVPB 500 mg (0 mg Intravenous Stopped 11/03/2018 2216)    Mobility walks High fall risk   Focused Assessments Cardiac Assessment Handoff:  Cardiac Rhythm: Sinus tachycardia Lab Results  Component Value Date   CKTOTAL 645 (H) 11/12/2018   CKMB 2.2 05/08/2009   TROPONINI 0.12 ([place]) 10/21/2018   No results found for: DDIMER Does the Patient currently have chest pain? No     R Recommendations: See  Admitting Provider Note  Report given to:   Additional Notes: Pt is unresponsive and intubated.

## 2018-10-27 NOTE — ED Triage Notes (Signed)
Pt was found in bathroom by law enforcement during welfare check. Per ems pt was unresponsive and 78% on room air. Pt is 90% on NRB. Pt was recently treated for uti and hypokalemia.

## 2018-10-27 NOTE — H&P (Addendum)
NAME:  Sean [name], MRN:  [id number], DOB:  09-Mar-1951, LOS: 0 ADMISSION DATE:  11/01/2018, CONSULTATION DATE:  10/21/2018 REFERRING MD:  Dr. Laverta [facility], CHIEF COMPLAINT:  AMS/ hypoxia  Brief History   68 year old male found unresponsive and hypoxic at home on welfare check.  Required intubation for hypoxia and GCS of 8.  CTH negative.  Found to have AKI with severe hypercalcemia.  CXR concerning for RLL infiltrate vs mass.  Started on abx and transferred to [facility] for further evaluation and management.   History of present illness   HPI obtained from medical chart review as patient is sedated on mechanical ventilation.  68 year old male with history of tobacco abuse, hypertension, hyperlipidemia, BPH, and GERD who was found unresponsive at home after welfare check by police.  EMS found him hypoxic in the 70's and minimally responsive.     Recent admit 4/30- 5/4 for AKI, dehydration, hypercalcemia, hypokalemia, and hypomagnesemia.  Not seen since 5/29 and last facebook post 6/3.  Unclear downtime.    On arrival to ER, temp 99.9, he was tachycardic, tachypneic with some distress, normotensive, hypoxic, with ongoing altered mental status, moving all extremitities but noted to have right gaze.   He was intubated for hypoxia and airway protection given GCS of 8.  Workup noted for  Na 149, CO2 21, glucose 182, sCr 4.75 ( most recent 1.04 on 5/4), BUN 108, calcium 13.6, albumin 2.9, BNP 77, troponin 0.12, lactic acid 4.7, WBC 24.7, mild Transaminitis, tBili 2.2, UA neg, COVID negative, EKG negative for acute STE, CT head negative intracranial process; noted right scalp hematoma.  CXR noted good placement of ETT and shows RLL opacity possibly mass vs inflitrate.  Cultures sent and started on empiric vancomycin, cefepime, and flagyl.  Given 2L NS.  Patient to be transferred to [facility] for further evaluation and management, PCCM to admit.   Past Medical History  Tobacco abuse, HTN, GERD, BPH, HLD, G1DD   Significant Hospital Events   6/9 admit/ tx from [facility]  Consults:  Nephrology   Procedures:  6/9 ETT >>  Significant Diagnostic Tests:  6/9 CTH >> Moderate right parietal scalp hematoma. Bilateral ethmoid and frontal sinusitis. No acute intracranial abnormality seen.   CT chest/ abd/ pelvis >>  Micro Data:  6/9 SARS coronavirus 2 >> neg 6/9 BCx 2 >> 6/9 trach asp >>  Antimicrobials:  6/9 flagyl 6/9 vanc >> 6/9 cefepime >>  Interim history/subjective:   Objective   Blood pressure (!) 141/93, pulse (!) 131, temperature 99.3 F (37.4 C), resp. rate (!) 9, weight 110.6 kg, SpO2 98 %.    Vent Mode: PRVC FiO2 (%):  [100 %] 100 % Set Rate:  [18 bmp] 18 bmp Vt Set:  [600 mL] 600 mL PEEP:  [5 cmH20] 5 cmH20 Plateau Pressure:  [15 cmH20] 15 cmH20  No intake or output data in the 24 hours ending 10/21/2018 2215 Filed Weights   10/25/2018 2121  Weight: 110.6 kg   Examination: General:  Older than apparent age unkept male unresponsive on MV HEENT: MM pink/moist- copious creamy oral coating, ETT at 26, OGT with coffee-ground output, pupils 4/reactive Neuro: unresponsive, not f/c, no withdrawal to pain, spont move his feet and bite at ETT CV: ir rr, no murmur PULM: even/non-labored, lungs bilaterally rhonchi GI: obese, soft, ND, BS+, foley with dark brown/amber urine Extremities: warm/dry, trace LE edema  Skin: no rashes, patient has areas of indention on his heels, buttock and right flank  Resolved Hospital Problem list    Assessment & Plan:   Acute hypoxic respiratory failure  RLL opacity/ mass- ?squamous cell carcinoma given metabolic findings Tobacco abuse  P:  Full MV support, PRVC 8 cc/kg, rate 18 Wean FiO2/ peep for goal sat >92-99% VAP bundle CXR/ ABG on arrival  Will obtain CT chest to further evaluate possible mass See below   Acute encephalopathy  - right gaze in ER, since resolved - CT head negative for intracranial process P:  Empiric c-collar, add CT  cervical  Consider EEG ongoing neuro exams Send UDS/ ETOH  Leukocytosis - UA neg, questionable RLL infiltrate vs mass   P:  Pan-culture Assess PCT  Empiric vanc/ cefepime Trend WBC/ fever curve   AKI /Uremia - baseline sCr 1.04 at prior discharge Hypernatremia - corrected 150 AGMA/ lactic acidosis  BPH Hypercalcemia- corrected Ca - 14.5 Myoglobin > 500 c/w rhabdo, CK 645 - s/p 2L IVF  - previous PTH low, normal vit D- unclear hx of malignancy (RLL opacity will need further evaluation, hx colon polyps by colonscopy in 2017 and BPH), and was on chlorthalidone but unclear if still taking P:  2 amps bicarb in sterile water at 200 ml/hr Free water 250 ml q 6hr per tube Calcitonin 4 units/kg SQ BID x 4 doses w/ pamidronate once  Send urine lytes Will obtain CT abd/ pelvis - look for renal obstruction and possible evidence of malignancy  Check PSA, SPEP and UPEP Foley catheter- continue  Nephrology consulted, will see in am  Renal panel / mag at 0500  Elevated troponin 0.12 - likely just demand but rule out ACS; no acute EKG changes  P:  Trend troponin q 6 x 3 and EKG   Mild Transaminitis - ?shock vs hx of occasional ETOH, prior CT in 2010 demonstrates fatty liver P:  Trend LFts/ INR Check tylenol/ salicylate level CT abd/ pelvis as above  HTN P:  Hold home toprol and doxazosin- unclear if patient had restarted chlorthalidone, benazepril and amlodipine held from last discharge  HLD P:  Hold statin with Transaminitis   DMT2 P:  Hold home metformin/ actos CBG q 4/ SSI sensitive  GERD Coffee ground output from OGT P:  PPI BID Trend H/H- stable thus far  Best practice:  Diet: NPO Pain/Anxiety/Delirium protocol (if indicated): prn  VAP protocol (if indicated): yes DVT prophylaxis: heparin SQ GI prophylaxis: Pepcid Glucose control: SSI sensitive Mobility: BR Code Status: full  Family Communication:  [place], [place] [phone] Disposition: ICU   Labs   CBC: Recent Labs  Lab 10/23/2018 2000  WBC 24.7*  NEUTROABS 20.6*  HGB 15.6  HCT 47.5  MCV 90.6  PLT 660    Basic Metabolic Panel: Recent Labs  Lab 10/30/2018 2000  NA 149*  K 4.3  CL 108  CO2 21*  GLUCOSE 182*  BUN 108*  CREATININE 4.75*  CALCIUM 13.6*   GFR: Estimated Creatinine Clearance: 19.9 mL/min (A) (by C-G formula based on SCr of 4.75 mg/dL (H)). Recent Labs  Lab 10/26/2018 2000  WBC 24.7*  LATICACIDVEN 4.7*    Liver Function Tests: Recent Labs  Lab 11/12/2018 2000  AST 56*  ALT 60*  ALKPHOS 95  BILITOT 2.2*  PROT 7.1  ALBUMIN 2.9*   No results for input(s): LIPASE, AMYLASE in the last 168 hours. No results for input(s): AMMONIA in the last 168 hours.  ABG    Component Value Date/Time   PHART 7.330 (L) 10/19/2018 2111   PCO2ART 42.1  10/25/2018 2111   PO2ART 113 (H) 11/06/2018 2111   HCO3 21.3 10/24/2018 2111   ACIDBASEDEF 3.4 (H) 11/07/2018 2111   O2SAT 96.2 10/30/2018 2111     Coagulation Profile: No results for input(s): INR, PROTIME in the last 168 hours.  Cardiac Enzymes: Recent Labs  Lab 11/05/2018 2000  TROPONINI 0.12*    HbA1C: Hgb A1c MFr Bld  Date/Time Value Ref Range Status  09/18/2018 03:31 AM 5.7 (H) 4.8 - 5.6 % Final    Comment:    (NOTE) Pre diabetes:          5.7%-6.4% Diabetes:              >6.4% Glycemic control for   <7.0% adults with diabetes   12/09/2013 09:21 AM 7.1 (H) <5.7 % Final    Comment:                                                                           According to the ADA Clinical Practice Recommendations for 2011, when HbA1c is used as a screening test:     >=6.5%   Diagnostic of Diabetes Mellitus            (if abnormal result is confirmed)   5.7-6.4%   Increased risk of developing Diabetes Mellitus   References:Diagnosis and Classification of Diabetes Mellitus,Diabetes QHUT,6546,50(PTWSF 1):S62-S69 and Standards of Medical Care in         Diabetes - 2011,Diabetes Care,2011,34  (Suppl 1):S11-S61.      CBG: No results for input(s): GLUCAP in the last 168 hours.  Review of Systems:   Unable   Past Medical History  He,  has a past medical history of Abnormal EKG, Bilateral sciatica, Chronic back pain, Chronic hip pain, bilateral, Diabetes mellitus, DJD (degenerative joint disease), GERD (gastroesophageal reflux disease), H/O hiatal hernia, HLD (hyperlipidemia), HTN (hypertension), dizziness, Neuropathy, Obesity, Paresthesia of foot, bilateral, Shortness of breath, and Tobacco abuse.   Surgical History    Past Surgical History:  Procedure Laterality Date  . BIOPSY  07/11/2015   Procedure: BIOPSY;  Surgeon: Danie Binder, MD;  Location: [facility];  Service: Endoscopy;;  . CATARACT EXTRACTION  2011-12   Bilateral  . COLONOSCOPY     At [facility]: polypectomy x 3 via resection, all with fragments of tubular adenoma.  . COLONOSCOPY WITH PROPOFOL N/A 07/11/2015   Procedure: COLONOSCOPY WITH PROPOFOL;  Surgeon: Danie Binder, MD;  Location: [facility];  Service: Endoscopy;  Laterality: N/A;  200 - per office, pt can't come earlier  . HERNIA REPAIR Bilateral 1997  . JOINT REPLACEMENT     bilateral hip   . LUMBAR SPINE SURGERY  2004   Laminectomy and fusion  . POLYPECTOMY  07/11/2015   Procedure: POLYPECTOMY biopsy multiple;  Surgeon: Danie Binder, MD;  Location: [facility];  Service: Endoscopy;;  ceacl polyp APC used  . TOTAL HIP ARTHROPLASTY  2005   Left     Social History   reports that he has been smoking cigarettes. He has a 100.00 pack-year smoking history. He has never used smokeless tobacco. He reports current alcohol use. He reports that he does not use drugs.   Family History   His  family history includes Stroke in his mother. There is no history of Colon cancer.   Allergies No Known Allergies   Home Medications  Prior to Admission medications   Medication Sig Start Date End Date Taking? Authorizing Provider  Cholecalciferol (VITAMIN D)  1000 UNITS capsule Take 1,000 Units by mouth daily.      [provider]  doxazosin (CARDURA) 4 MG tablet Take 1 tablet by mouth  daily 07/27/15   Arnoldo Lenis, MD  esomeprazole (NEXIUM) 40 MG capsule Take 40 mg by mouth daily.      [provider]  magnesium oxide (MAG-OX) 400 MG tablet Take 1 tablet (400 mg total) by mouth 2 (two) times daily. 09/21/18   Barton Dubois, MD  meloxicam (MOBIC) 15 MG tablet Take 15 mg by mouth daily.  07/30/12   [provider]  metFORMIN (GLUCOPHAGE) 1000 MG tablet Take 1 tablet by mouth 2 (two) times daily. 07/03/18   [provider]  methocarbamol (ROBAXIN) 500 MG tablet Take 1 tablet (500 mg total) by mouth 4 (four) times daily. 01/20/18   Evalee Jefferson, PA-C  metoprolol succinate (TOPROL-XL) 100 MG 24 hr tablet Take 1 tablet by mouth  daily with or immediatley  following a meal 10/02/15   Arnoldo Lenis, MD  oxyCODONE-acetaminophen (PERCOCET/ROXICET) 5-325 MG tablet Take 1 tablet by mouth every 12 (twelve) hours as needed for severe pain. 09/21/18   Barton Dubois, MD  pioglitazone (ACTOS) 15 MG tablet Take 1 tablet by mouth daily. 08/31/18   [provider]  potassium chloride SA (K-DUR) 20 MEQ tablet Take 2 tablets (40 mEq total) by mouth daily. 09/22/18   Barton Dubois, MD  pravastatin (PRAVACHOL) 80 MG tablet Take 1 tablet by mouth  every evening 10/02/15   Arnoldo Lenis, MD     Critical care time: 56 mins    Kennieth Rad, MSN, AGACNP-BC [facility] Pgr: [phone] or if no answer [phone] 10/28/2018, 2:35 AM

## 2018-10-27 NOTE — ED Provider Notes (Signed)
Emergency Department Provider Note   I have reviewed the triage vital signs and the nursing notes.   HISTORY  Chief Complaint Altered Mental Status   HPI Sean [name] is a 68 y.o. male with PMH of HTN, HLD, tobacco use, DM, and recent admit with AKI and electrolyte disturbance and UTI presents to the emergency department by EMS after being found unresponsive in the bathroom.  Law enforcement arrived for a welfare check.  They called EMS who found him to be hypoxemic to 78% on RA and minimally responsive.  Their exam was grimacing with deep painful stimulus and not following commands. No further history available on arrival.   Level 5 caveat: Unresponsive.    Past Medical History:  Diagnosis Date  . Abnormal EKG    negative stress nuclear-2005; probable inferolateral scarring in 2007- negative EKG at 10 mets   . Bilateral sciatica   . Chronic back pain   . Chronic hip pain, bilateral   . Diabetes mellitus    AODM- no insulin- fasting cbg 100-150  . 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 GERD (gastroesophageal reflux disease)   . H/O hiatal hernia   . HLD (hyperlipidemia)   . HTN (hypertension)   . Hx of dizziness   . Neuropathy   . Obesity   . Paresthesia of foot, bilateral   . Shortness of breath    exertion  . Tobacco abuse    70 pack years    Patient Active Problem List   Diagnosis Date Noted  . Respiratory failure ([place]) 11/08/2018  . Generalized weakness   . Physical deconditioning   . Benign prostatic hyperplasia without lower urinary tract symptoms   . AKI (acute kidney injury) ([place])   . Hypomagnesemia   . Hypercalcemia 09/18/2018  . Diarrhea 01/26/2016  . Nausea with vomiting 01/26/2016  . History of adenomatous polyp of colon 01/26/2016  . Encounter for screening colonoscopy 07/06/2015  . Spondylolisthesis of lumbar region 08/17/2012  . Hypokalemia 12/17/2010  . DJD (degenerative joint disease)   . Type 2 diabetes  mellitus treated without insulin ([place]) 01/25/2010  . Hyperlipidemia 01/25/2010  . Tobacco abuse 01/25/2010  . Hypertension 05/29/2009    Past Surgical History:  Procedure Laterality Date  . BIOPSY  07/11/2015   Procedure: BIOPSY;  Surgeon: Danie Binder, MD;  [facility];  Service: Endoscopy;;  . CATARACT EXTRACTION  2011-12   Bilateral  . COLONOSCOPY     At [facility]: polypectomy x 3 via resection, all with fragments of tubular adenoma.  . COLONOSCOPY WITH PROPOFOL N/A 07/11/2015   Procedure: COLONOSCOPY WITH PROPOFOL;  Surgeon: Danie Binder, MD;  Location: [facility];  Service: Endoscopy;  Laterality: N/A;  200 - per office, pt can't come earlier  . HERNIA REPAIR Bilateral 1997  . JOINT REPLACEMENT     bilateral hip   . LUMBAR SPINE SURGERY  2004   Laminectomy and fusion  . POLYPECTOMY  07/11/2015   Procedure: POLYPECTOMY biopsy multiple;  Surgeon: Danie Binder, MD;  Location: [facility];  Service: Endoscopy;;  ceacl polyp APC used  . TOTAL HIP ARTHROPLASTY  2005   Left    Allergies Patient has no known allergies.  Family History  Problem Relation Age of Onset  . Stroke Mother   . Colon cancer Neg Hx     Social History Social History   Tobacco Use  . Smoking status: Current Every Day Smoker  Packs/day: 2.00    Years: 50.00    Pack years: 100.00    Types: Cigarettes  . Smokeless tobacco: Never Used  . Tobacco comment: 70 pack years  Substance Use Topics  . Alcohol use: Yes    Alcohol/week: 0.0 standard drinks    Comment: occasional; one drink about 2 times a month.  . Drug use: No    Review of Systems  Level 5 caveat:   ____________________________________________   PHYSICAL EXAM:  VITAL SIGNS: Vitals:   10/20/2018 2240 11/07/2018 2300  BP: (!) 122/96 (!) 140/93  Pulse:  (!) 116  Resp:    Temp: 97.9 F (36.6 C) 97.7 F (36.5 C)  SpO2:  99%    Constitutional: Minimally responsive on arrival. Grimacing to sternal rub.  Eyes:  Conjunctivae are normal. PERRL. Head: Atraumatic. Nose: No congestion/rhinnorhea. Mouth/Throat: Mucous membranes are very dry.  Neck: No stridor.  Cardiovascular: Sinus tachycardia. Good peripheral circulation. Grossly normal heart sounds.   Respiratory: Increased respiratory effort with tachypnea.  No retractions. Lungs CTAB. Gastrointestinal: Soft and nontender. No distention.  Musculoskeletal:  No gross deformities of extremities. Neurologic: GCS 8 (E2,V1,M5). Right gaze preference noted. Moving all extremities to pain but moving left side less.  Skin:  Skin is warm, dry and intact. No rash noted.  ____________________________________________   LABS (all labs ordered are listed, but only abnormal results are displayed)  Labs Reviewed  URINALYSIS, ROUTINE W REFLEX MICROSCOPIC - Abnormal; Notable for the following components:      Result Value   Color, Urine AMBER (*)    APPearance CLOUDY (*)    Hgb urine dipstick SMALL (*)    Bilirubin Urine SMALL (*)    Protein, ur 30 (*)    All other components within normal limits  LACTIC ACID, PLASMA - Abnormal; Notable for the following components:   Lactic Acid, Venous 4.7 (*)    All other components within normal limits  LACTIC ACID, PLASMA - Abnormal; Notable for the following components:   Lactic Acid, Venous 4.5 (*)    All other components within normal limits  COMPREHENSIVE METABOLIC PANEL - Abnormal; Notable for the following components:   Sodium 149 (*)    CO2 21 (*)    Glucose, Bld 182 (*)    BUN 108 (*)    Creatinine, Ser 4.75 (*)    Calcium 13.6 (*)    Albumin 2.9 (*)    AST 56 (*)    ALT 60 (*)    Total Bilirubin 2.2 (*)    GFR calc non Af Amer 12 (*)    GFR calc Af Amer 14 (*)    Anion gap 20 (*)    All other components within normal limits  CBC WITH DIFFERENTIAL/PLATELET - Abnormal; Notable for the following components:   WBC 24.7 (*)    RDW 15.8 (*)    nRBC 0.4 (*)    Neutro Abs 20.6 (*)    Monocytes Absolute  2.6 (*)    Abs Immature Granulocytes 0.27 (*)    All other components within normal limits  TROPONIN I - Abnormal; Notable for the following components:   Troponin I 0.12 (*)    All other components within normal limits  CK - Abnormal; Notable for the following components:   Total CK 645 (*)    All other components within normal limits  BLOOD GAS, ARTERIAL - Abnormal; Notable for the following components:   pH, Arterial 7.330 (*)    pO2, Arterial 113 (*)  Acid-base deficit 3.4 (*)    All other components within normal limits  PHOSPHORUS - Abnormal; Notable for the following components:   Phosphorus 4.9 (*)    All other components within normal limits  CULTURE, BLOOD (ROUTINE X 2)  CULTURE, BLOOD (ROUTINE X 2)  SARS CORONAVIRUS 2 (HOSPITAL ORDER, [place] [facility])  BRAIN NATRIURETIC PEPTIDE  MAGNESIUM  CBC  BASIC METABOLIC PANEL  RAPID URINE DRUG SCREEN, HOSP PERFORMED  PROCALCITONIN  PROCALCITONIN  CREATININE, URINE, RANDOM  SODIUM, URINE, RANDOM   ____________________________________________  EKG   EKG Interpretation  Date/Time:  Tuesday October 27 2018 19:56:48 EDT Ventricular Rate:  139 PR Interval:    QRS Duration: 77 QT Interval:  303 QTC Calculation: 461 R Axis:   52 Text Interpretation:  Sinus tachycardia Multiple premature complexes, vent & supraven Repol abnrm suggests ischemia, anterolateral No STEMI. T wave changes similar to prior.  Confirmed by Nanda Quinton [phone]) on 11/15/2018 8:20:54 PM       ____________________________________________  RADIOLOGY  Ct Head Wo Contrast  Result Date: 10/21/2018 CLINICAL DATA:  Altered level of consciousness. EXAM: CT HEAD WITHOUT CONTRAST TECHNIQUE: Contiguous axial images were obtained from the base of the skull through the vertex without intravenous contrast. COMPARISON:  None. FINDINGS: Brain: No evidence of acute infarction, hemorrhage, hydrocephalus, extra-axial collection or mass lesion/mass  effect. Vascular: No hyperdense vessel or unexpected calcification. Skull: Normal. Negative for fracture or focal lesion. Sinuses/Orbits: Bilateral ethmoid and frontal sinusitis is noted. Other: Moderate right parietal scalp hematoma is noted. IMPRESSION: Moderate right parietal scalp hematoma. Bilateral ethmoid and frontal sinusitis. No acute intracranial abnormality seen. Electronically Signed   By: Marijo Conception M.D.   On: 10/23/2018 21:11   Dg Chest Portable 1 View  Result Date: 10/20/2018 CLINICAL DATA:  Found unresponsive with low oxygen saturation. EXAM: PORTABLE CHEST 1 VIEW COMPARISON:  04/09/2013 FINDINGS: Endotracheal tube tip is 4 cm above the carina. Orogastric or nasogastric tube enters the abdomen. The left lung is clear. There is abnormal opacity in the medial right lower lung. This could represent pneumonia or a mass lesion. No apparent effusion. IMPRESSION: Endotracheal tube in good position. Abnormal opacity in the right medial lower chest could be pneumonia or a mass lesion. Electronically Signed   By: Nelson Chimes M.D.   On: 11/12/2018 20:33    ____________________________________________   PROCEDURES  Procedure(s) performed:   Date/Time: 11/04/2018 11:01 PM Performed by: Margette Fast, MD Pre-anesthesia Checklist: Emergency Drugs available, Suction available and Patient being monitored Oxygen Delivery Method: Non-rebreather mask Preoxygenation: Pre-oxygenation with 100% oxygen Induction Type: Rapid sequence Laryngoscope Size: Glidescope and 3 Grade View: Grade II Tube size: 8.0 mm Number of attempts: 1 Airway Equipment and Method: Video-laryngoscopy Placement Confirmation: ETT inserted through vocal cords under direct vision,  CO2 detector and Breath sounds checked- equal and bilateral Secured at: 26 cm Tube secured with: ETT holder Dental Injury: Teeth and Oropharynx as per pre-operative assessment  Difficulty Due To: Difficult Airway- due to large tongue and  Difficult Airway-  due to edematous airway    .Critical Care Performed by: Margette Fast, MD Authorized by: Margette Fast, MD   Critical care provider statement:    Critical care time (minutes):  75   Critical care time was exclusive of:  Separately billable procedures and treating other patients and teaching time   Critical care was necessary to treat or prevent imminent or life-threatening deterioration of the following conditions:  Sepsis, respiratory  failure and CNS failure or compromise   Critical care was time spent personally by me on the following activities:  Blood draw for specimens, development of treatment plan with patient or surrogate, discussions with consultants, evaluation of patient's response to treatment, examination of patient, obtaining history from patient or surrogate, ordering and performing treatments and interventions, ordering and review of laboratory studies, ordering and review of radiographic studies, pulse oximetry, re-evaluation of patient's condition, review of old charts and ventilator management   I assumed direction of critical care for this patient from another provider in my specialty: no       ____________________________________________   INITIAL IMPRESSION / ASSESSMENT AND PLAN / ED COURSE  Pertinent labs & imaging results that were available during my care of the patient were reviewed by me and considered in my medical decision making (see chart for details).   Patient arrives to the emergency department minimally responsive with increased respiratory effort and hypoxemia.  He appears to have a right gaze preference.  He is moving all extremities seems to be moving the right upper extremity preferentially.  Initial assessment shows a patient with sinus tachycardia and normal blood pressure.  Lungs without wheezing.  He was found down at home.  We will reach out to family for additional information if able to be provided.  EMS advises the patient  is full code.  Patient with GCS of 8 and hypoxemia.  Decided to intubate the patient.  Will send for stat head CT to rule out acute hemorrhage.  COVID pending.   09:10 PM Spoke with the patient's family by phone.  They have not seen him since May 31.  They last saw him post on Facebook on June 3.  When they were unable to reach him by phone on Saturday and then again today they decided to call for the welfare check.  They were unaware that the patient had deteriorated so quickly.  No new medications that they are aware of.  Last they talk to the patient he was not complaining of anything in particular.  Patient with acute renal failure and significant leukocytosis along with lactic acidosis.  30/kg bolus given along with broad-spectrum antibiotics.  Unclear source at this time.  COVID test is negative.  CT head with no acute findings.  Vital signs improving after intubation and IV fluids.   Discussed patient's case with ICU, Dr. Wallis Bamberg to request admission. Patient and family (if present) updated with plan. Care transferred to ICU service.  I placed a temporary admission orders at his request.  I reviewed all nursing notes, vitals, pertinent old records, EKGs, labs, imaging (as available).  ____________________________________________  FINAL CLINICAL IMPRESSION(S) / ED DIAGNOSES  Final diagnoses:  Acute renal failure, unspecified acute renal failure type (HCC)  Altered mental status, unspecified altered mental status type  Sepsis, due to unspecified organism, unspecified whether acute organ dysfunction present [facility])     MEDICATIONS GIVEN DURING THIS VISIT:  Medications  fentaNYL (SUBLIMAZE) injection 100 mcg (has no administration in time range)  fentaNYL (SUBLIMAZE) injection 100 mcg (100 mcg Intravenous Given 11/12/2018 2003)  midazolam (VERSED) injection 2 mg (has no administration in time range)  midazolam (VERSED) injection 2 mg (2 mg Intravenous Given 11/14/2018 2003)  vancomycin  (VANCOCIN) IVPB 1000 mg/200 mL premix (1,000 mg Intravenous New Bag/Given 11/01/2018 2238)  ceFEPIme (MAXIPIME) 2 g in sodium chloride 0.9 % 100 mL IVPB (has no administration in time range)  sodium chloride 0.9 % bolus 1,000 mL (1,000  mLs Intravenous New Bag/Given 11/02/2018 2237)    And  sodium chloride 0.9 % bolus 500 mL (has no administration in time range)  naloxone [facility]) injection 2 mg (2 mg Intravenous Given 10/19/2018 1950)  etomidate (AMIDATE) injection 20 mg (20 mg Intravenous Given 11/16/2018 2003)  rocuronium [facility]) injection 100 mg (100 mg Intravenous Given 11/16/2018 2004)  sodium chloride 0.9 % bolus 1,000 mL (0 mLs Intravenous Stopped 10/24/2018 2125)  ceFEPIme (MAXIPIME) 2 g in sodium chloride 0.9 % 100 mL IVPB (0 g Intravenous Stopped 11/02/2018 2146)  metroNIDAZOLE (FLAGYL) IVPB 500 mg (0 mg Intravenous Stopped 10/23/2018 2216)    Note:  This document was prepared using Dragon voice recognition software and may include unintentional dictation errors.  Nanda Quinton, MD Emergency Medicine    Orman Matsumura, [facility], MD 11/15/2018 [phone]

## 2018-10-27 NOTE — ED Notes (Signed)
Date and time results received: 11/02/2018 2232 (use smartphrase ".now" to insert current time)  Test: lactic acid Critical Value: 4.5  Name of Provider Notified: dr long  Orders Received? Or Actions Taken?: Actions Taken: orders received.

## 2018-10-27 NOTE — ED Notes (Signed)
Date and time results received: 11/09/2018 2130 (use smartphrase ".now" to insert current time)  Test: Troponin Critical Value: 0.12  Name of Provider Notified: Dr Laverta [place]  Orders Received? Or Actions Taken?: Actions Taken: no orders taken

## 2018-10-27 NOTE — ED Notes (Signed)
Pt's son, Osmin Welz, notified of his condition. His contact number is [phone].

## 2018-10-27 NOTE — ED Notes (Signed)
Date and time results received: 11/04/2018 2057  Test: Lactic Acid and Calcium Critical Value: 4.7-LA 13.6-Ca+  Name of Provider Notified: Long  Orders Received? Or Actions Taken?: n/a

## 2018-10-28 ENCOUNTER — Inpatient Hospital Stay (HOSPITAL_COMMUNITY): Payer: Medicare Other

## 2018-10-28 DIAGNOSIS — J9601 Acute respiratory failure with hypoxia: Secondary | ICD-10-CM

## 2018-10-28 DIAGNOSIS — G9340 Encephalopathy, unspecified: Secondary | ICD-10-CM | POA: Diagnosis present

## 2018-10-28 DIAGNOSIS — L8990 Pressure ulcer of unspecified site, unspecified stage: Secondary | ICD-10-CM

## 2018-10-28 LAB — CBC
HCT: 44.5 % (ref 39.0–52.0)
Hemoglobin: 14.6 g/dL (ref 13.0–17.0)
MCH: 29.7 pg (ref 26.0–34.0)
MCHC: 32.8 g/dL (ref 30.0–36.0)
MCV: 90.4 fL (ref 80.0–100.0)
Platelets: 170 10*3/uL (ref 150–400)
RBC: 4.92 MIL/uL (ref 4.22–5.81)
RDW: 15.4 % (ref 11.5–15.5)
WBC: 24.6 10*3/uL — ABNORMAL HIGH (ref 4.0–10.5)
nRBC: 0.2 % (ref 0.0–0.2)

## 2018-10-28 LAB — COMPREHENSIVE METABOLIC PANEL
ALT: 51 U/L — ABNORMAL HIGH (ref 0–44)
AST: 42 U/L — ABNORMAL HIGH (ref 15–41)
Albumin: 2.4 g/dL — ABNORMAL LOW (ref 3.5–5.0)
Alkaline Phosphatase: 83 U/L (ref 38–126)
Anion gap: 18 — ABNORMAL HIGH (ref 5–15)
BUN: 105 mg/dL — ABNORMAL HIGH (ref 8–23)
CO2: 19 mmol/L — ABNORMAL LOW (ref 22–32)
Calcium: 12 mg/dL — ABNORMAL HIGH (ref 8.9–10.3)
Chloride: 111 mmol/L (ref 98–111)
Creatinine, Ser: 4.37 mg/dL — ABNORMAL HIGH (ref 0.61–1.24)
GFR calc Af Amer: 15 mL/min — ABNORMAL LOW (ref >60)
GFR calc non Af Amer: 13 mL/min — ABNORMAL LOW (ref >60)
Glucose, Bld: 240 mg/dL — ABNORMAL HIGH (ref 70–99)
Potassium: 4 mmol/L (ref 3.5–5.1)
Sodium: 148 mmol/L — ABNORMAL HIGH (ref 135–145)
Total Bilirubin: 2.3 mg/dL — ABNORMAL HIGH (ref 0.3–1.2)
Total Protein: 5.8 g/dL — ABNORMAL LOW (ref 6.5–8.1)

## 2018-10-28 LAB — TRIGLYCERIDES: Triglycerides: 237 mg/dL — ABNORMAL HIGH (ref <150)

## 2018-10-28 LAB — RAPID URINE DRUG SCREEN, HOSP PERFORMED
Amphetamines: NOT DETECTED
Barbiturates: NOT DETECTED
Benzodiazepines: NOT DETECTED
Cocaine: NOT DETECTED
Opiates: NOT DETECTED
Tetrahydrocannabinol: NOT DETECTED

## 2018-10-28 LAB — GLUCOSE, CAPILLARY
Glucose-Capillary: 148 mg/dL — ABNORMAL HIGH (ref 70–99)
Glucose-Capillary: 175 mg/dL — ABNORMAL HIGH (ref 70–99)
Glucose-Capillary: 176 mg/dL — ABNORMAL HIGH (ref 70–99)
Glucose-Capillary: 183 mg/dL — ABNORMAL HIGH (ref 70–99)
Glucose-Capillary: 184 mg/dL — ABNORMAL HIGH (ref 70–99)
Glucose-Capillary: 196 mg/dL — ABNORMAL HIGH (ref 70–99)
Glucose-Capillary: 220 mg/dL — ABNORMAL HIGH (ref 70–99)

## 2018-10-28 LAB — BASIC METABOLIC PANEL
Anion gap: 16 — ABNORMAL HIGH (ref 5–15)
BUN: 104 mg/dL — ABNORMAL HIGH (ref 8–23)
CO2: 22 mmol/L (ref 22–32)
Calcium: 10.8 mg/dL — ABNORMAL HIGH (ref 8.9–10.3)
Chloride: 107 mmol/L (ref 98–111)
Creatinine, Ser: 3.94 mg/dL — ABNORMAL HIGH (ref 0.61–1.24)
GFR calc Af Amer: 17 mL/min — ABNORMAL LOW (ref >60)
GFR calc non Af Amer: 15 mL/min — ABNORMAL LOW (ref >60)
Glucose, Bld: 174 mg/dL — ABNORMAL HIGH (ref 70–99)
Potassium: 4.5 mmol/L (ref 3.5–5.1)
Sodium: 145 mmol/L (ref 135–145)

## 2018-10-28 LAB — BLOOD CULTURE ID PANEL (REFLEXED)

## 2018-10-28 LAB — SODIUM, URINE, RANDOM
Sodium, Ur: <10 mmol/L
Sodium, Ur: 21 mmol/L

## 2018-10-28 LAB — ACETAMINOPHEN LEVEL: Acetaminophen (Tylenol), Serum: <10 ug/mL — ABNORMAL LOW (ref 10–30)

## 2018-10-28 LAB — LACTIC ACID, PLASMA
Lactic Acid, Venous: 4.5 mmol/L (ref 0.5–1.9)
Lactic Acid, Venous: 3.6 mmol/L (ref 0.5–1.9)

## 2018-10-28 LAB — TROPONIN I: Troponin I: 0.08 ng/mL (ref <0.03)

## 2018-10-28 LAB — SALICYLATE LEVEL: Salicylate Lvl: <7 mg/dL (ref 2.8–30.0)

## 2018-10-28 LAB — CREATININE, URINE, RANDOM
Creatinine, Urine: 552 mg/dL
Creatinine, Urine: 331.67 mg/dL

## 2018-10-28 LAB — MRSA PCR SCREENING: MRSA by PCR: NEGATIVE

## 2018-10-28 LAB — CK
Total CK: 655 U/L — ABNORMAL HIGH (ref 49–397)
Total CK: 496 U/L — ABNORMAL HIGH (ref 49–397)

## 2018-10-28 LAB — PROCALCITONIN: Procalcitonin: 0.35 ng/mL

## 2018-10-28 LAB — PSA: Prostatic Specific Antigen: 41.77 ng/mL — ABNORMAL HIGH (ref 0.00–4.00)

## 2018-10-28 MED ORDER — ORAL CARE MOUTH RINSE
15.0000 mL | OROMUCOSAL | Status: DC
  Administered 2018-10-28: 15 mL via OROMUCOSAL

## 2018-10-28 MED ORDER — CALCITONIN (SALMON) 200 UNIT/ML IJ SOLN
4.0000 [IU]/kg | Freq: Two times a day (BID) | INTRAMUSCULAR | Status: AC
  Administered 2018-10-28 – 2018-10-29 (×4): 396 [IU] via SUBCUTANEOUS
  Filled 2018-10-28 (×5): qty 1.98

## 2018-10-28 MED ORDER — STERILE WATER FOR INJECTION IV SOLN
INTRAVENOUS | Status: DC
  Administered 2018-10-28 (×2): via INTRAVENOUS
  Filled 2018-10-28 (×3): qty 900

## 2018-10-28 MED ORDER — STERILE WATER FOR INJECTION IV SOLN: INTRAVENOUS | Status: DC

## 2018-10-28 MED ORDER — ORAL CARE MOUTH RINSE
15.0000 mL | OROMUCOSAL | Status: DC
  Administered 2018-10-28 – 2018-10-31 (×32): 15 mL via OROMUCOSAL

## 2018-10-28 MED ORDER — CHLORHEXIDINE GLUCONATE 0.12% ORAL RINSE (MEDLINE KIT)
15.0000 mL | Freq: Two times a day (BID) | OROMUCOSAL | Status: DC
  Administered 2018-10-28 – 2018-10-31 (×7): 15 mL via OROMUCOSAL

## 2018-10-28 MED ORDER — HEPARIN (PORCINE) 25000 UT/250ML-% IV SOLN
1350.0000 [IU]/h | INTRAVENOUS | Status: DC
  Administered 2018-10-28 – 2018-10-31 (×4): 1350 [IU]/h via INTRAVENOUS
  Filled 2018-10-28 (×4): qty 250

## 2018-10-28 MED ORDER — VANCOMYCIN HCL IN DEXTROSE 1-5 GM/200ML-% IV SOLN
1000.0000 mg | INTRAVENOUS | Status: DC
  Administered 2018-10-29: 1000 mg via INTRAVENOUS
  Filled 2018-10-28: qty 200

## 2018-10-28 MED ORDER — HEPARIN BOLUS VIA INFUSION
2000.0000 [IU] | Freq: Once | INTRAVENOUS | Status: AC
  Administered 2018-10-28: 2000 [IU] via INTRAVENOUS
  Filled 2018-10-28: qty 2000

## 2018-10-28 MED ORDER — SODIUM CHLORIDE 0.9 % IV SOLN
2.0000 g | INTRAVENOUS | Status: DC
  Administered 2018-10-29: 2 g via INTRAVENOUS
  Filled 2018-10-28 (×2): qty 2

## 2018-10-28 MED ORDER — INSULIN ASPART 100 UNIT/ML ~~LOC~~ SOLN
0.0000 [IU] | SUBCUTANEOUS | Status: DC
  Administered 2018-10-28: 1 [IU] via SUBCUTANEOUS
  Administered 2018-10-29: 2 [IU] via SUBCUTANEOUS
  Administered 2018-10-29 – 2018-10-30 (×3): 1 [IU] via SUBCUTANEOUS
  Administered 2018-10-30 (×3): 2 [IU] via SUBCUTANEOUS
  Administered 2018-10-30 (×2): 1 [IU] via SUBCUTANEOUS
  Administered 2018-10-31 (×3): 2 [IU] via SUBCUTANEOUS

## 2018-10-28 MED ORDER — HEPARIN SODIUM (PORCINE) 5000 UNIT/ML IJ SOLN
5000.0000 [IU] | Freq: Three times a day (TID) | INTRAMUSCULAR | Status: DC
  Administered 2018-10-28 (×2): 5000 [IU] via SUBCUTANEOUS
  Filled 2018-10-28 (×2): qty 1

## 2018-10-28 MED ORDER — CHLORHEXIDINE GLUCONATE CLOTH 2 % EX PADS
6.0000 | MEDICATED_PAD | Freq: Every day | CUTANEOUS | Status: DC
  Administered 2018-10-28 – 2018-10-31 (×4): 6 via TOPICAL

## 2018-10-28 MED ORDER — STERILE WATER FOR INJECTION IV SOLN
INTRAVENOUS | Status: DC
  Administered 2018-10-28 – 2018-10-29 (×2): via INTRAVENOUS
  Filled 2018-10-28 (×5): qty 850

## 2018-10-28 MED ORDER — PANTOPRAZOLE SODIUM 40 MG IV SOLR
40.0000 mg | Freq: Two times a day (BID) | INTRAVENOUS | Status: DC
  Administered 2018-10-28 – 2018-10-29 (×5): 40 mg via INTRAVENOUS
  Filled 2018-10-28 (×6): qty 40

## 2018-10-28 MED ORDER — FREE WATER
250.0000 mL | Freq: Four times a day (QID) | Status: DC
  Administered 2018-10-28 – 2018-10-29 (×6): 250 mL

## 2018-10-28 MED ORDER — CHLORHEXIDINE GLUCONATE 0.12% ORAL RINSE (MEDLINE KIT): 15.0000 mL | Freq: Two times a day (BID) | OROMUCOSAL | Status: DC

## 2018-10-28 MED ORDER — PROPOFOL 1000 MG/100ML IV EMUL
5.0000 ug/kg/min | INTRAVENOUS | Status: DC
  Administered 2018-10-29: 5 ug/kg/min via INTRAVENOUS
  Administered 2018-10-29 – 2018-10-30 (×3): 20 ug/kg/min via INTRAVENOUS
  Administered 2018-10-30: 14 ug/kg/min via INTRAVENOUS
  Administered 2018-10-31: 20 ug/kg/min via INTRAVENOUS
  Filled 2018-10-28 (×6): qty 100

## 2018-10-28 MED ORDER — FENTANYL 2500MCG IN NS 250ML (10MCG/ML) PREMIX INFUSION
50.0000 ug/h | INTRAVENOUS | Status: DC
  Administered 2018-10-28: 50 ug/h via INTRAVENOUS
  Administered 2018-10-29: 150 ug/h via INTRAVENOUS
  Administered 2018-10-30: 120 ug/h via INTRAVENOUS
  Administered 2018-10-30: 150 ug/h via INTRAVENOUS
  Filled 2018-10-28 (×4): qty 250

## 2018-10-28 MED ORDER — SODIUM CHLORIDE 0.9 % IV SOLN
60.0000 mg | Freq: Once | INTRAVENOUS | Status: AC
  Administered 2018-10-28: 60 mg via INTRAVENOUS
  Filled 2018-10-28: qty 20

## 2018-10-28 NOTE — Progress Notes (Signed)
Initial Nutrition Assessment RD working remotely.  DOCUMENTATION CODES:   Obesity unspecified  INTERVENTION:   If unable to extubate patient within the next 24 hours, recommend begin TF via OGT:   Vital High Protein at 50 ml/h (1200 ml per day)   Pro-stat 30 ml TID   Provides 1500 kcal, 150 gm protein, 1003 ml free water daily  NUTRITION DIAGNOSIS:   Inadequate oral intake related to inability to eat as evidenced by NPO status.  GOAL:   Provide needs based on ASPEN/SCCM guidelines  MONITOR:   Vent status, Labs, Skin, I & O's  REASON FOR ASSESSMENT:   Ventilator    ASSESSMENT:   68 yo male admitted with AMS/hypoxia after being found down at home during a welfare check. Intubated on admission. PMH includes HTN, tobacco abuse, HLD.  Patient was found to have AKI with severe hypercalcemia. CXR concerning for RLL infiltrate vs mass.  Patient is currently intubated on ventilator support MV: 14.3 L/min Temp (24hrs), Avg:97.9 F (36.6 C), Min:96.8 F (36 C), Max:99.9 F (37.7 C)   Labs reviewed. BUN 104 (H), creatinine 3.94 (H) CBG's: 196-175  Medications reviewed. Receiving 250 ml free water every 6 hours.    Per review of weight encounters, patient has lost 10% of usual weight within the past month.  NUTRITION - FOCUSED PHYSICAL EXAM:  deferred  Diet Order:   Diet Order            Diet NPO time specified  Diet effective now              EDUCATION NEEDS:   No education needs have been identified at this time  Skin:  Skin Assessment: Skin Integrity Issues: Skin Integrity Issues:: Stage II, DTI DTI: heel, back, R flank, R buttocks, L leg Stage II: sacrum  Last BM:  PTA  Height:   Ht Readings from Last 1 Encounters:  10/28/18 5\' 9"  (1.753 m)    Weight:   Wt Readings from Last 1 Encounters:  10/28/18 99.1 kg    Ideal Body Weight:  72.7 kg  BMI:  Body mass index is 32.26 kg/m.  Estimated Nutritional Needs:   Kcal:   1350-1550  Protein:  145 gm  Fluid:  1.8 L    Molli Barrows, RD, LDN, CNSC Pager [phone] After Hours Pager [phone]

## 2018-10-28 NOTE — Progress Notes (Signed)
RT to pt room for low SpO2. Pt suctioned via ett with minimal secretions and then taken off of ventilator and bagged with a peep valve. Pt bagged with 100% FiO2. Pt increased to peep of 10 with no improvement and then to 16. Pt was bagged for 15 minutes and then placed back on ventilator once SpO2 was 95%. MD notified. Verbal order to wean peep/FiO2 as tolerated.

## 2018-10-28 NOTE — Progress Notes (Signed)
Pharmacy Antibiotic Note  Sean [name] is a 68 y.o. male admitted on 11/09/2018 with sepsis.  Pharmacy has been consulted for vancomycin and cefepime dosing.  Patient received vancomycin 2 g IV on 6/9 ~2200. Maintenance dosing was deferred to due uncertainty in trends in renal function. SCr has improved from 4.75 on admission to 3.94 this afternoon.   Plan: Vancomycin 1,000 mg IV q48h. Goal AUC 400-550. Expected AUC: 512 SCr used: 3.94 Change cefepime to 2 g IV q24h Monitor renal function, cultures, and vancomycin levels as needed  Height: 5\' 9"  (175.3 cm) Weight: 218 lb 7.6 oz (99.1 kg) IBW/kg (Calculated) : 70.7  Temp (24hrs), Avg:98 F (36.7 C), Min:96.8 F (36 C), Max:99.9 F (37.7 C)  Recent Labs  Lab 11/14/2018 2000 10/28/2018 2203 10/28/18 0422 10/28/18 0423 10/28/18 0751  WBC 24.7*  --  24.6*  --   --   CREATININE 4.75*  --  4.37*  --   --   LATICACIDVEN 4.7* 4.5*  --  4.5* 3.6*    Estimated Creatinine Clearance: 19.3 mL/min (A) (by C-G formula based on SCr of 4.37 mg/dL (H)).    No Known Allergies  Antimicrobials this admission: Metronidazole 6/9 x1 Vancomycin 6/9>>  Cefepime 6/9>>  Dose adjustments this admission: 6/10 cefepime 2 g IV q12h >> q24h  Microbiology results: 6/9 BC x2: ngtd 6/9 COVID: negative 6/9 MRSA: negative 6/10 TA: pending  Thank you for allowing pharmacy to be a part of this patient's care.  Vertis Kelch, PharmD PGY1 Pharmacy Resident Phone [phone] 10/28/2018       2:28 PM

## 2018-10-28 NOTE — Progress Notes (Signed)
ANTICOAGULATION CONSULT NOTE - Initial Consult  Pharmacy Consult for IV heparin Indication: DVT  No Known Allergies  Patient Measurements: Height: 5\' 9"  (175.3 cm) Weight: 218 lb 7.6 oz (99.1 kg) IBW/kg (Calculated) : 70.7 Heparin Dosing Weight: ~ 95 kg  Vital Signs: Temp: 98.1 F (36.7 C) (06/10 1600) BP: 139/101 (06/10 1600) Pulse Rate: 122 (06/10 1600)  Labs: Recent Labs    10/29/2018 2000 10/28/18 0422 10/28/18 1221  HGB 15.6 14.6  --   HCT 47.5 44.5  --   PLT 253 170  --   LABPROT 15.4*  --   --   INR 1.2  --   --   CREATININE 4.75* 4.37* 3.94*  CKTOTAL 645* 655* 496*  TROPONINI 0.12* 0.08*  --     Estimated Creatinine Clearance: 21.4 mL/min (A) (by C-G formula based on SCr of 3.94 mg/dL (H)).   Medical History: Past Medical History:  Diagnosis Date  . Abnormal EKG    negative stress nuclear-2005; probable inferolateral scarring in 2007- negative EKG at 10 mets   . Bilateral sciatica   . Chronic back pain   . Chronic hip pain, bilateral   . Diabetes mellitus    AODM- no insulin- fasting cbg 100-150  . 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 GERD (gastroesophageal reflux disease)   . H/O hiatal hernia   . HLD (hyperlipidemia)   . HTN (hypertension)   . Hx of dizziness   . Neuropathy   . Obesity   . Paresthesia of foot, bilateral   . Shortness of breath    exertion  . Tobacco abuse    70 pack years    Medications:  Infusions:  . [START ON 10/29/2018] ceFEPime (MAXIPIME) IV    . fentaNYL infusion INTRAVENOUS 50 mcg/hr (10/28/18 1714)  . heparin    . propofol (DIPRIVAN) infusion    . sodium bicarbonate 150 mEq in sterile water for infusion 1064mL infusion 100 mL/hr at 10/28/18 1405  . [START ON 10/29/2018] vancomycin      Assessment: 68 yo male with new bilateral DVT, suspected PE.  Pharmacy asked to start anticoagulation with IV heparin.  Received sq heparin 5000 unit dose at 1400 pm today.  Baseline CBC WNL.  Goal  of Therapy:  Heparin level 0.3-0.7 units/ml Monitor platelets by anticoagulation protocol: Yes   Plan:  1. Start heparin with 2000 unit bolus x 1 2. Then start heparin gtt at 1350 units/hr. 3. Check heparin level 8 hrs after gtt starts. 4. Daily heparin level and CBC.  Marguerite Olea, [facility] Clinical Pharmacist Phone [phone]  10/28/2018 5:23 PM

## 2018-10-28 NOTE — Consult Note (Signed)
Reason for Consult: Renal failure Referring Physician:  Dr. Nanda Quinton  Chief Complaint:  AMS  Assessment/Plan: 1. Acute kidney injury on CKD w/ CK 655 and e/o prerenal azotemia with a FeNA <1% responding to fluids. Urine microscopy is not active. CKD component from HTN and DM with a BL creatinine in the 1-1.3 range. Acute component appears to be from prerenal azotemia. 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 - Continue aggressive hydration as you are doing; he is responding already. Likely significantly volume down from immobility + hypercalcemia leading decreased activity of aquaporin2 channels.   2. Hypercalcemia - already trending down with hydration from 13.6 to 12. - SPEP, UPEP, PTH, PTHrP -  Already on calcitonin BID + pamidronate.  3. Hypernatremia - free water deficit of 3L + any ongoing losses. 4. Prostate Ca - PSA is 41.8     HPI: Sean [name] is an 68 y.o. male  HTN DM HLD smoker (>100PY) recent admission for AKI and UTI brought in by EMS after being found unresponse in the bathroom after law enforcement performed a welfare check. Sats were 78% in the field. CT head showed e/o trauma with a rt parietal scalp hematoma + sinusitis but o/w no acute intracranial abnormality. Pt was intubated in the ED and also found to be hypernatremic, hypercalcemic and in renal failure.  He was also found to be hypercalcemic during an admission 09/18/2018 when he presented with generalized weakness; Ca was 14.5 at that time w/ a creatinine of 2.09 but came down to the 1-1.3 range @ time of d/c. Per report pt's family had not seen him since May 31st and were unable to reach him on Saturday. COVID neg.   ROS Review of systems not obtained due to patient factors.  Chemistry and CBC: Creat  Date/Time Value Ref Range Status  02/23/2015 09:14 AM 0.95 0.70 - 1.25 mg/dL Final  12/09/2013 09:21 AM 1.22  0.50 - 1.35 mg/dL Final  02/25/2013 04:07 PM 1.27 0.50 - 1.35 mg/dL Final  07/08/2012 02:33 PM 1.25 0.50 - 1.35 mg/dL Final  12/27/2011 09:35 AM 1.32 0.50 - 1.35 mg/dL Final  08/01/2011 08:30 AM 1.38 (H) 0.50 - 1.35 mg/dL Final  11/27/2010 02:23 PM 1.26 0.50 - 1.35 mg/dL Final   Creatinine, Ser  Date/Time Value Ref Range Status  10/28/2018 04:22 AM 4.37 (H) 0.61 - 1.24 mg/dL Final  11/06/2018 08:00 PM 4.75 (H) 0.61 - 1.24 mg/dL Final  09/21/2018 05:06 AM 1.04 0.61 - 1.24 mg/dL Final  09/20/2018 06:20 AM 1.14 0.61 - 1.24 mg/dL Final  09/19/2018 10:27 AM 1.21 0.61 - 1.24 mg/dL Final  09/18/2018 08:05 AM 1.54 (H) 0.61 - 1.24 mg/dL Final  09/17/2018 08:57 PM 2.09 (H) 0.61 - 1.24 mg/dL Final  07/07/2015 01:39 PM 1.13 0.61 - 1.24 mg/dL Final  08/06/2012 09:23 AM 1.07 0.50 - 1.35 mg/dL Final  01/04/2011 06:06 AM 1.03 0.50 - 1.35 mg/dL Final  01/03/2011 05:20 AM 1.09 0.50 - 1.35 mg/dL Final  01/02/2011 06:55 AM 1.03 0.50 - 1.35 mg/dL Final  12/24/2010 09:24 AM 1.45 (H) 0.50 - 1.35 mg/dL Final  07/09/2010 10:46 AM 1.61 (H) 0.4 - 1.5 mg/dL Final  03/24/2010 06:53 PM 1.16 0.40 - 1.50 mg/dL Final  05/10/2009 04:00 AM 1.08 DELTA CHECK NOTED 0.4 - 1.5 mg/dL Final  05/09/2009 04:25 AM 1.75 (H) 0.4 - 1.5 mg/dL Final  05/08/2009 01:25 PM  2.54 (H) 0.4 - 1.5 mg/dL Final  04/27/2007 04:24 PM 1.35  Final  05/15/2006 1.09 mg/dL    Recent Labs  Lab 11/17/2018 2000 11/04/2018 2237 10/28/18 0422  NA 149*  --  148*  K 4.3  --  4.0  CL 108  --  111  CO2 21*  --  19*  GLUCOSE 182*  --  240*  BUN 108*  --  105*  CREATININE 4.75*  --  4.37*  CALCIUM 13.6*  --  12.0*  PHOS  --  4.9*  --    Recent Labs  Lab 11/07/2018 2000 10/28/18 0422  WBC 24.7* 24.6*  NEUTROABS 20.6*  --   HGB 15.6 14.6  HCT 47.5 44.5  MCV 90.6 90.4  PLT 253 170   Liver Function Tests: Recent Labs  Lab 10/22/2018 2000 10/28/18 0422  AST 56* 42*  ALT 60* 51*  ALKPHOS 95 83  BILITOT 2.2* 2.3*  PROT 7.1 5.8*  ALBUMIN 2.9*  2.4*   No results for input(s): LIPASE, AMYLASE in the last 168 hours. No results for input(s): AMMONIA in the last 168 hours. Cardiac Enzymes: Recent Labs  Lab 10/29/2018 2000 10/28/18 0422  CKTOTAL 645* 655*  TROPONINI 0.12* 0.08*   Iron Studies: No results for input(s): IRON, TIBC, TRANSFERRIN, FERRITIN in the last 72 hours. PT/INR: '@LABRCNTIP' (inr:5)  Xrays/Other Studies: ) Results for orders placed or performed during the hospital encounter of 11/10/2018 (from the past 48 hour(s))  Lactic acid, plasma     Status: Abnormal   Collection Time: 11/02/2018  8:00 PM  Result Value Ref Range   Lactic Acid, Venous 4.7 (HH) 0.5 - 1.9 mmol/L    Comment: CRITICAL RESULT CALLED TO, READ BACK BY AND VERIFIED WITH: CRABTREE,B ON 10/19/2018 AT 2100 BY LOY,C Performed at [facility], [address], [place], [place] [address]   Comprehensive metabolic panel     Status: Abnormal   Collection Time: 11/08/2018  8:00 PM  Result Value Ref Range   Sodium 149 (H) 135 - 145 mmol/L   Potassium 4.3 3.5 - 5.1 mmol/L   Chloride 108 98 - 111 mmol/L   CO2 21 (L) 22 - 32 mmol/L   Glucose, Bld 182 (H) 70 - 99 mg/dL   BUN 108 (H) 8 - 23 mg/dL    Comment: RESULTS CONFIRMED BY MANUAL DILUTION   Creatinine, Ser 4.75 (H) 0.61 - 1.24 mg/dL   Calcium 13.6 (HH) 8.9 - 10.3 mg/dL    Comment: CRITICAL RESULT CALLED TO, READ BACK BY AND VERIFIED WITH: CRABTREE,B ON 10/28/2018 AT 2100 BY LOY,C    Total Protein 7.1 6.5 - 8.1 g/dL   Albumin 2.9 (L) 3.5 - 5.0 g/dL   AST 56 (H) 15 - 41 U/L   ALT 60 (H) 0 - 44 U/L   Alkaline Phosphatase 95 38 - 126 U/L   Total Bilirubin 2.2 (H) 0.3 - 1.2 mg/dL   GFR calc non Af Amer 12 (L) >60 mL/min   GFR calc Af Amer 14 (L) >60 mL/min   Anion gap 20 (H) 5 - 15    Comment: Performed at [facility], [address], [place],  [address]  CBC WITH DIFFERENTIAL     Status: Abnormal   Collection Time: 10/29/2018  8:00 PM  Result Value Ref Range   WBC 24.7 (H) 4.0 - 10.5 K/uL    Comment:  WHITE COUNT CONFIRMED ON SMEAR   RBC 5.24 4.22 - 5.81 MIL/uL   Hemoglobin 15.6 13.0 -  17.0 g/dL   HCT 47.5 39.0 - 52.0 %   MCV 90.6 80.0 - 100.0 fL   MCH 29.8 26.0 - 34.0 pg   MCHC 32.8 30.0 - 36.0 g/dL   RDW 15.8 (H) 11.5 - 15.5 %   Platelets 253 150 - 400 K/uL   nRBC 0.4 (H) 0.0 - 0.2 %   Neutrophils Relative % 83 %   Neutro Abs 20.6 (H) 1.7 - 7.7 K/uL   Lymphocytes Relative 5 %   Lymphs Abs 1.1 0.7 - 4.0 K/uL   Monocytes Relative 11 %   Monocytes Absolute 2.6 (H) 0.1 - 1.0 K/uL   Eosinophils Relative 0 %   Eosinophils Absolute 0.0 0.0 - 0.5 K/uL   Basophils Relative 0 %   Basophils Absolute 0.0 0.0 - 0.1 K/uL   Immature Granulocytes 1 %   Abs Immature Granulocytes 0.27 (H) 0.00 - 0.07 K/uL    Comment: Performed at [facility], [address], [place], [place] [address]  Blood Culture (routine x 2)     Status: None (Preliminary result)   Collection Time: 10/21/2018  8:00 PM  Result Value Ref Range   Specimen Description LEFT ANTECUBITAL    Special Requests      BOTTLES DRAWN AEROBIC AND ANAEROBIC Blood Culture adequate volume   Culture      NO GROWTH < 12 HOURS Performed at [facility], [address] [place], [place] [address]    Report Status PENDING   Brain natriuretic peptide     Status: None   Collection Time: 11/16/2018  8:00 PM  Result Value Ref Range   B Natriuretic Peptide 77.0 0.0 - 100.0 pg/mL    Comment: Performed at [facility], [address], [place], [place] [address]  Troponin I - Add-On to previous collection     Status: Abnormal   Collection Time: 11/03/2018  8:00 PM  Result Value Ref Range   Troponin I 0.12 (HH) <0.03 ng/mL    Comment: CRITICAL RESULT CALLED TO, READ BACK BY AND VERIFIED WITH: BELTON,C ON 10/24/2018 AT 2130 BY LOY,C Performed at [facility], [address], [place], [place] [address]   CK     Status: Abnormal   Collection Time: 11/11/2018  8:00 PM  Result Value Ref Range   Total CK 645 (H) 49 - 397 U/L    Comment: Performed at [facility], [address] [place], [place] [address]  Ethanol     Status: None   Collection Time: 10/22/2018  8:00 PM  Result Value Ref Range   Alcohol, Ethyl (B) <10 <10 mg/dL    Comment: (NOTE) Lowest detectable limit for serum alcohol is 10 mg/dL. For medical purposes only. Performed at [facility], [address], [place], [place] [address]   Protime-INR     Status: Abnormal   Collection Time: 11/14/2018  8:00 PM  Result Value Ref Range   Prothrombin Time 15.4 (H) 11.4 - 15.2 seconds   INR 1.2 0.8 - 1.2    Comment: (NOTE) INR goal varies based on device and disease states. Performed at [facility], [address], [place], [place] [address]   SARS Coronavirus 2 (CEPHEID- Performed in [facility] hospital lab), Hosp Order     Status: None   Collection Time: 11/10/2018  8:16 PM  Result Value Ref Range   SARS Coronavirus 2 NEGATIVE NEGATIVE    Comment: (NOTE) If result is NEGATIVE SARS-CoV-2 target nucleic acids are NOT DETECTED. The SARS-CoV-2 RNA is generally detectable  in upper and lower  respiratory specimens during the acute phase of infection. The lowest  concentration of SARS-CoV-2 viral copies this assay can detect is 250  copies / mL. A negative result does not preclude SARS-CoV-2 infection  and should not be used as the sole basis for treatment or other  patient management decisions.  A negative result may occur with  improper specimen collection / handling, submission of specimen other  than nasopharyngeal swab, presence of viral mutation(s) within the  areas targeted by this assay, and inadequate number of viral copies  (<250 copies / mL). A negative result must be combined with clinical  observations, patient history, and epidemiological information. If result is POSITIVE SARS-CoV-2 target nucleic acids are DETECTED. The SARS-CoV-2 RNA is generally detectable in upper and lower  respiratory specimens dur ing the acute phase of infection.  Positive  results are indicative of  active infection with SARS-CoV-2.  Clinical  correlation with patient history and other diagnostic information is  necessary to determine patient infection status.  Positive results do  not rule out bacterial infection or co-infection with other viruses. If result is PRESUMPTIVE POSTIVE SARS-CoV-2 nucleic acids MAY BE PRESENT.   A presumptive positive result was obtained on the submitted specimen  and confirmed on repeat testing.  While 2019 novel coronavirus  (SARS-CoV-2) nucleic acids may be present in the submitted sample  additional confirmatory testing may be necessary for epidemiological  and / or clinical management purposes  to differentiate between  SARS-CoV-2 and other Sarbecovirus currently known to infect humans.  If clinically indicated additional testing with an alternate test  methodology [id number]) is advised. The SARS-CoV-2 RNA is generally  detectable in upper and lower respiratory sp ecimens during the acute  phase of infection. The expected result is Negative. Fact Sheet for Patients:  StrictlyIdeas.no Fact Sheet for Healthcare Providers: BankingDealers.co.za This test is not yet approved or cleared by the [place] FDA and has been authorized for detection and/or diagnosis of SARS-CoV-2 by FDA under an Emergency Use Authorization (EUA).  This EUA will remain in effect (meaning this test can be used) for the duration of the COVID-19 declaration under Section 564(b)(1) of the Act, 21 U.S.C. section 360bbb-3(b)(1), unless the authorization is terminated or revoked sooner. Performed at [facility], [address] [place], [place] [address]   Blood Culture (routine x 2)     Status: None (Preliminary result)   Collection Time: 11/14/2018  8:38 PM  Result Value Ref Range   Specimen Description BLOOD LEFT HAND    Special Requests      BOTTLES DRAWN AEROBIC AND ANAEROBIC Blood Culture adequate volume   Culture      NO  GROWTH < 12 HOURS Performed at [facility], [address], [place], [place] [address]    Report Status PENDING   Urinalysis, Routine w reflex microscopic     Status: Abnormal   Collection Time: 11/06/2018  9:00 PM  Result Value Ref Range   Color, Urine AMBER (A) YELLOW    Comment: BIOCHEMICALS MAY BE AFFECTED BY COLOR   APPearance CLOUDY (A) CLEAR   Specific Gravity, Urine 1.021 1.005 - 1.030   pH 5.0 5.0 - 8.0   Glucose, UA NEGATIVE NEGATIVE mg/dL   Hgb urine dipstick SMALL (A) NEGATIVE   Bilirubin Urine SMALL (A) NEGATIVE   Ketones, ur NEGATIVE NEGATIVE mg/dL   Protein, ur 30 (A) NEGATIVE mg/dL   Nitrite NEGATIVE NEGATIVE   Leukocytes,Ua NEGATIVE NEGATIVE   RBC /  HPF 0-5 0 - 5 RBC/hpf   WBC, UA 0-5 0 - 5 WBC/hpf   Bacteria, UA NONE SEEN NONE SEEN   Squamous Epithelial / LPF 0-5 0 - 5   Mucus PRESENT     Comment: Performed at [facility], [address], [place], [place] [address]  Blood gas, arterial     Status: Abnormal   Collection Time: 11/01/2018  9:11 PM  Result Value Ref Range   FIO2 100.00    pH, Arterial 7.330 (L) 7.350 - 7.450   pCO2 arterial 42.1 32.0 - 48.0 mmHg   pO2, Arterial 113 (H) 83.0 - 108.0 mmHg   Bicarbonate 21.3 20.0 - 28.0 mmol/L   Acid-base deficit 3.4 (H) 0.0 - 2.0 mmol/L   O2 Saturation 96.2 %   Patient temperature 37.6    Allens test (pass/fail) PASS PASS    Comment: Performed at [facility], [address], [place], [place] [address]  Lactic acid, plasma     Status: Abnormal   Collection Time: 11/06/2018 10:03 PM  Result Value Ref Range   Lactic Acid, Venous 4.5 (HH) 0.5 - 1.9 mmol/L    Comment: CRITICAL RESULT CALLED TO, READ BACK BY AND VERIFIED WITH: BELTON,C ON 10/24/2018 AT 2230 BY LOY,C Performed at [facility], [address] [place], [place] [address]   Urine rapid drug screen (hosp performed)     Status: None   Collection Time: 10/24/2018 10:36 PM  Result Value Ref Range   Opiates NONE DETECTED NONE DETECTED   Cocaine NONE DETECTED NONE  DETECTED   Benzodiazepines NONE DETECTED NONE DETECTED   Amphetamines NONE DETECTED NONE DETECTED   Tetrahydrocannabinol NONE DETECTED NONE DETECTED   Barbiturates NONE DETECTED NONE DETECTED    Comment: (NOTE) DRUG SCREEN FOR MEDICAL PURPOSES ONLY.  IF CONFIRMATION IS NEEDED FOR ANY PURPOSE, NOTIFY LAB WITHIN 5 DAYS. LOWEST DETECTABLE LIMITS FOR URINE DRUG SCREEN Drug Class                     Cutoff (ng/mL) Amphetamine and metabolites    1000 Barbiturate and metabolites    200 Benzodiazepine                 361 Tricyclics and metabolites     300 Opiates and metabolites        300 Cocaine and metabolites        300 THC                            50 Performed at [facility], [address], [place], [place] [address]   Procalcitonin - Baseline     Status: None   Collection Time: 11/07/2018 10:37 PM  Result Value Ref Range   Procalcitonin 0.36 ng/mL    Comment:        Interpretation: PCT (Procalcitonin) <= 0.5 ng/mL: Systemic infection (sepsis) is not likely. Local bacterial infection is possible. (NOTE)       Sepsis PCT Algorithm           Lower Respiratory Tract                                      Infection PCT Algorithm    ----------------------------     ----------------------------         PCT < 0.25 ng/mL  PCT < 0.10 ng/mL         Strongly encourage             Strongly discourage   discontinuation of antibiotics    initiation of antibiotics    ----------------------------     -----------------------------       PCT 0.25 - 0.50 ng/mL            PCT 0.10 - 0.25 ng/mL               OR       >80% decrease in PCT            Discourage initiation of                                            antibiotics      Encourage discontinuation           of antibiotics    ----------------------------     -----------------------------         PCT >= 0.50 ng/mL              PCT 0.26 - 0.50 ng/mL               AND        <80% decrease in PCT             Encourage  initiation of                                             antibiotics       Encourage continuation           of antibiotics    ----------------------------     -----------------------------        PCT >= 0.50 ng/mL                  PCT > 0.50 ng/mL               AND         increase in PCT                  Strongly encourage                                      initiation of antibiotics    Strongly encourage escalation           of antibiotics                                     -----------------------------                                           PCT <= 0.25 ng/mL                                                 OR                                        >  80% decrease in PCT                                     Discontinue / Do not initiate                                             antibiotics Performed at [facility], [address] [place], [place] [address]   Magnesium     Status: None   Collection Time: 11/16/2018 10:37 PM  Result Value Ref Range   Magnesium 1.8 1.7 - 2.4 mg/dL    Comment: Performed at [facility], [address] [place], [place] [address]  Phosphorus     Status: Abnormal   Collection Time: 11/08/2018 10:37 PM  Result Value Ref Range   Phosphorus 4.9 (H) 2.5 - 4.6 mg/dL    Comment: Performed at [facility], [address], [place], [place] [address]  Creatinine, urine, random     Status: None   Collection Time: 11/13/2018 10:45 PM  Result Value Ref Range   Creatinine, Urine 552 mg/dL    Comment: RESULTS CONFIRMED BY MANUAL DILUTION Performed at [facility], [address], [place], [place] [address]   Sodium, urine, random     Status: None   Collection Time: 10/19/2018 10:45 PM  Result Value Ref Range   Sodium, Ur <10 mmol/L    Comment: Performed at [facility], [address], [place], [place] [address]  Glucose, capillary     Status: Abnormal   Collection Time: 10/28/18  1:55 AM  Result Value Ref Range   Glucose-Capillary 183 (H) 70 - 99 mg/dL  MRSA  PCR Screening     Status: None   Collection Time: 10/28/18  1:58 AM  Result Value Ref Range   MRSA by PCR NEGATIVE NEGATIVE    Comment:        The GeneXpert MRSA Assay (FDA approved for NASAL specimens only), is one component of a comprehensive MRSA colonization surveillance program. It is not intended to diagnose MRSA infection nor to guide or monitor treatment for MRSA infections. Performed at [facility], [place] [address], [place], [place] [address]   Culture, respiratory (non-expectorated)     Status: None (Preliminary result)   Collection Time: 10/28/18  2:47 AM  Result Value Ref Range   Specimen Description TRACHEAL ASPIRATE    Special Requests NONE    Gram Stain      RARE WBC PRESENT, PREDOMINANTLY PMN FEW GRAM POSITIVE COCCI FEW GRAM POSITIVE RODS RARE GRAM NEGATIVE RODS Performed at [facility], [place] [address] [place], [place] [address]    Culture PENDING    Report Status PENDING   Glucose, capillary     Status: Abnormal   Collection Time: 10/28/18  4:07 AM  Result Value Ref Range   Glucose-Capillary 220 (H) 70 - 99 mg/dL  Procalcitonin     Status: None   Collection Time: 10/28/18  4:22 AM  Result Value Ref Range   Procalcitonin 0.35 ng/mL    Comment:        Interpretation: PCT (Procalcitonin) <= 0.5 ng/mL: Systemic infection (sepsis) is not likely. Local bacterial infection is possible. (NOTE)       Sepsis PCT Algorithm           Lower  Respiratory Tract                                      Infection PCT Algorithm    ----------------------------     ----------------------------         PCT < 0.25 ng/mL                PCT < 0.10 ng/mL         Strongly encourage             Strongly discourage   discontinuation of antibiotics    initiation of antibiotics    ----------------------------     -----------------------------       PCT 0.25 - 0.50 ng/mL            PCT 0.10 - 0.25 ng/mL               OR       >80% decrease in PCT            Discourage  initiation of                                            antibiotics      Encourage discontinuation           of antibiotics    ----------------------------     -----------------------------         PCT >= 0.50 ng/mL              PCT 0.26 - 0.50 ng/mL               AND        <80% decrease in PCT             Encourage initiation of                                             antibiotics       Encourage continuation           of antibiotics    ----------------------------     -----------------------------        PCT >= 0.50 ng/mL                  PCT > 0.50 ng/mL               AND         increase in PCT                  Strongly encourage                                      initiation of antibiotics    Strongly encourage escalation           of antibiotics                                     -----------------------------  PCT <= 0.25 ng/mL                                                 OR                                        > 80% decrease in PCT                                     Discontinue / Do not initiate                                             antibiotics Performed at [facility], [place] [address] [place], [place] [address]   Comprehensive metabolic panel     Status: Abnormal   Collection Time: 10/28/18  4:22 AM  Result Value Ref Range   Sodium 148 (H) 135 - 145 mmol/L   Potassium 4.0 3.5 - 5.1 mmol/L   Chloride 111 98 - 111 mmol/L   CO2 19 (L) 22 - 32 mmol/L   Glucose, Bld 240 (H) 70 - 99 mg/dL   BUN 105 (H) 8 - 23 mg/dL   Creatinine, Ser 4.37 (H) 0.61 - 1.24 mg/dL   Calcium 12.0 (H) 8.9 - 10.3 mg/dL   Total Protein 5.8 (L) 6.5 - 8.1 g/dL   Albumin 2.4 (L) 3.5 - 5.0 g/dL   AST 42 (H) 15 - 41 U/L   ALT 51 (H) 0 - 44 U/L   Alkaline Phosphatase 83 38 - 126 U/L   Total Bilirubin 2.3 (H) 0.3 - 1.2 mg/dL   GFR calc non Af Amer 13 (L) >60 mL/min   GFR calc Af Amer 15 (L) >60 mL/min   Anion gap 18 (H) 5 - 15     Comment: Performed at [facility], [place] [address], [place], [place] [address]  PSA     Status: Abnormal   Collection Time: 10/28/18  4:22 AM  Result Value Ref Range   Prostatic Specific Antigen 41.77 (H) 0.00 - 4.00 ng/mL    Comment: (NOTE) While PSA levels of <=4.0 ng/ml are reported as reference range, some men with levels below 4.0 ng/ml can have prostate cancer and many men with PSA above 4.0 ng/ml do not have prostate cancer.  Other tests such as free PSA, age specific reference ranges, PSA velocity and PSA doubling time may be helpful especially in men less than 22 years old. Performed at [facility], [place] [address], [place], [place] [address]   Troponin I - Now Then Q6H     Status: Abnormal   Collection Time: 10/28/18  4:22 AM  Result Value Ref Range   Troponin I 0.08 (HH) <0.03 ng/mL    Comment: CRITICAL RESULT CALLED TO, READ BACK BY AND VERIFIED WITH: J.MOTLEY,RN 8811 10/28/2018 M.CAMPBELL Performed at [facility], [place] [address], [place], [place] [address]   Acetaminophen level     Status: Abnormal   Collection Time: 10/28/18  4:22 AM  Result Value Ref Range  Acetaminophen (Tylenol), Serum <10 (L) 10 - 30 ug/mL    Comment: (NOTE) Therapeutic concentrations vary significantly. A range of 10-30 ug/mL  may be an effective concentration for many patients. However, some  are best treated at concentrations outside of this range. Acetaminophen concentrations >150 ug/mL at 4 hours after ingestion  and >50 ug/mL at 12 hours after ingestion are often associated with  toxic reactions. Performed at [facility], [place] [address], [place], [place] [address]   Salicylate level     Status: None   Collection Time: 10/28/18  4:22 AM  Result Value Ref Range   Salicylate Lvl <3.4 2.8 - 30.0 mg/dL    Comment: Performed at [place] [address] [place], [place] [address]  CK     Status: Abnormal   Collection Time: 10/28/18  4:22 AM  Result  Value Ref Range   Total CK 655 (H) 49 - 397 U/L    Comment: Performed at [facility], [place] [address], [place], [place] [address]  CBC     Status: Abnormal   Collection Time: 10/28/18  4:22 AM  Result Value Ref Range   WBC 24.6 (H) 4.0 - 10.5 K/uL   RBC 4.92 4.22 - 5.81 MIL/uL   Hemoglobin 14.6 13.0 - 17.0 g/dL   HCT 44.5 39.0 - 52.0 %   MCV 90.4 80.0 - 100.0 fL   MCH 29.7 26.0 - 34.0 pg   MCHC 32.8 30.0 - 36.0 g/dL   RDW 15.4 11.5 - 15.5 %   Platelets 170 150 - 400 K/uL   nRBC 0.2 0.0 - 0.2 %    Comment: Performed at [facility], [place] [address] [place], [place] [address]  Lactic acid, plasma     Status: Abnormal   Collection Time: 10/28/18  4:23 AM  Result Value Ref Range   Lactic Acid, Venous 4.5 (HH) 0.5 - 1.9 mmol/L    Comment: CRITICAL RESULT CALLED TO, READ BACK BY AND VERIFIED WITH: J.MOTLEY,RN [id number] 10/28/2018 M.CAMPBELL Performed at [facility], [place] [address] [place], [place] [address]   Creatinine, urine, random     Status: None   Collection Time: 10/28/18  4:35 AM  Result Value Ref Range   Creatinine, Urine 331.67 mg/dL    Comment: Performed at [place] [address] [place], [place] [address]  Sodium, urine, random     Status: None   Collection Time: 10/28/18  4:35 AM  Result Value Ref Range   Sodium, Ur 21 mmol/L    Comment: Performed at [place] [address] [place], [place] [address]  Glucose, capillary     Status: Abnormal   Collection Time: 10/28/18  7:50 AM  Result Value Ref Range   Glucose-Capillary 184 (H) 70 - 99 mg/dL  Lactic acid, plasma     Status: Abnormal   Collection Time: 10/28/18  7:51 AM  Result Value Ref Range   Lactic Acid, Venous 3.6 (HH) 0.5 - 1.9 mmol/L    Comment: CRITICAL RESULT CALLED TO, READ BACK BY AND VERIFIED WITH: Janey Greaser AT [id number] 10/28/2018 BY ZBEECH. Performed at [facility], [place] [address], [place], [place] [address]   Glucose, capillary     Status: Abnormal    Collection Time: 10/28/18 11:21 AM  Result Value Ref Range   Glucose-Capillary 196 (H) 70 - 99 mg/dL   Ct Abdomen Pelvis Wo Contrast  Result Date: 10/28/2018 CLINICAL  DATA:  The patient was found unresponsive and CT workup of the head neck revealed adenopathy and lytic bony lesions. Chest radiography revealed a right lower lobe lesion for further workup. EXAM: CT CHEST, ABDOMEN AND PELVIS WITHOUT CONTRAST TECHNIQUE: Multidetector CT imaging of the chest, abdomen and pelvis was performed following the standard protocol without IV contrast. COMPARISON:  Multiple exams, including CT cervical spine from 10/28/2018 FINDINGS: CT CHEST FINDINGS Cardiovascular: Left anterior descending coronary artery and aortic arch atherosclerotic calcification. Mediastinum/Nodes: Bulky mediastinal adenopathy. Index right lower paratracheal node 2.4 cm in short axis on image 26/3. Index left lower paratracheal/AP window lymph node 2.5 cm in short axis on image 26/3. Prominent subcarinal and right hilar adenopathy. 3.0 by 2.3 cm left thyroid nodule. Lungs/Pleura: Volume loss and airspace opacity fills most of the right lower lobe, with narrowing of the right middle lobe and right lower lobe bronchi possibly from central tumor scared by the surrounding airspace opacity. Today's exam did not include IV contrast and accordingly it is especially difficult to separate atelectatic/consolidated lung from tumor in this vicinity. Mild bandlike atelectasis in the right middle lobe along with some very faint nodularity along the minor fissure. Indistinctly marginated peripheral subpleural nodularity in the right upper lobe posteriorly measures 0.8 by 0.7 cm on image 57/4. 4 mm left lower lobe pulmonary nodule on image 102/4. Bronchocele versus less likely dilated vessels from vascular malformation in the left lower lobe for example on image 81/4. Musculoskeletal: Potential 1.3 cm lucent lesion in the right sternal manubrium. Potential speckled  small indistinct lucencies in thoracic vertebra although these are somewhat subtle. CT ABDOMEN PELVIS FINDINGS The patient's arm position by his sides causes streak artifact which obscure posterior regions in the upper abdomen, streak artifact from the patient's lumbar hardware and bilateral hip implants likewise reduces diagnostic sensitivity. Hepatobiliary: No appreciable mass. Please note that IV contrast was not given on today's exam. High density in the gallbladder probably from sludge. No appreciable biliary dilatation. Pancreas: Unremarkable Spleen: Unremarkable Adrenals/Urinary Tract: 2.8 by 2.0 cm left adrenal mass with internal density 27 Hounsfield units, nonspecific. Similar nonspecific 2.0 by 1.1 cm right adrenal mass. Exophytic left mid to lower kidney lesion 1.3 cm in diameter on image 71/3, otherwise nonspecific. Possible slightly hyperdense lesion of the left mid kidney measuring 2.5 by 1.9 cm on image 64/3, partially obscured by streak artifact from the patient's arms. No discrete urinary tract calculi are identified, although the distal ureters in urinary bladder are completely obscured by streak artifact from the patient's hip implants. Suspected urinary bladder catheter Stomach/Bowel: Unremarkable Vascular/Lymphatic: Aortoiliac atherosclerotic vascular disease. No pathologic adenopathy in the abdomen/pelvis. Reproductive: Prostate gland obscured by streak artifact from the patient's hip implants. Other: No supplemental non-categorized findings. Musculoskeletal: Lucency along the left upper acetabulum is nonspecific but could possibly be from particulate disease. A 6 mm lucency in the right iliac bone posterolateral rod and pedicle screw fixation at L3-L4-L5-S1 with posterior decompression. Chronic appearing grade 1 anterolisthesis at L4-5. Slight inferior endplate wedging at L2. There is likely central narrowing of the thecal sac at L2-3 due to spurring and disc bulge. IMPRESSION: 1. Bulky  adenopathy in the chest favoring malignancy. Given the narrowing of the right lower lobe and right middle lobe bronchi and airspace opacity and volume loss in the right lower lobe, the possibility of a right infrahilar tumor is raised although today's noncontrast CT imaging makes it difficult to differentiate tumor from consolidated lung. Lymphoma with extrinsic nodal narrowing of the  right middle lobe and right lower lobe bronchi is a differential diagnostic consideration. 2. There several scattered lucencies in the thoracic vertebra, sternal manubrium, and right iliac bone which could reflect myeloma or small metastatic lesions. Nuclear medicine PET-CT could be helpful in further characterization. 3. 2.8 by 2.0 cm nonspecific left adrenal mass. 2.0 by 1.1 cm nonspecific right adrenal mass. 4. Small nodules in the right upper lobe and left lower lobe are nonspecific but merit surveillance. Probable bronchocele in the left lower lobe. 5. Other imaging findings of potential clinical significance: Aortic Atherosclerosis (ICD10-I70.0). Coronary atherosclerosis. Postoperative findings in the lumbar spine. Suspected central narrowing of the thecal sac at L2-3. Electronically Signed   By: Van Clines M.D.   On: 10/28/2018 07:54   Ct Head Wo Contrast  Result Date: 11/08/2018 CLINICAL DATA:  Altered level of consciousness. EXAM: CT HEAD WITHOUT CONTRAST TECHNIQUE: Contiguous axial images were obtained from the base of the skull through the vertex without intravenous contrast. COMPARISON:  None. FINDINGS: Brain: No evidence of acute infarction, hemorrhage, hydrocephalus, extra-axial collection or mass lesion/mass effect. Vascular: No hyperdense vessel or unexpected calcification. Skull: Normal. Negative for fracture or focal lesion. Sinuses/Orbits: Bilateral ethmoid and frontal sinusitis is noted. Other: Moderate right parietal scalp hematoma is noted. IMPRESSION: Moderate right parietal scalp hematoma. Bilateral  ethmoid and frontal sinusitis. No acute intracranial abnormality seen. Electronically Signed   By: Marijo Conception M.D.   On: 10/19/2018 21:11   Ct Chest Wo Contrast  Result Date: 10/28/2018 CLINICAL DATA:  The patient was found unresponsive and CT workup of the head neck revealed adenopathy and lytic bony lesions. Chest radiography revealed a right lower lobe lesion for further workup. EXAM: CT CHEST, ABDOMEN AND PELVIS WITHOUT CONTRAST TECHNIQUE: Multidetector CT imaging of the chest, abdomen and pelvis was performed following the standard protocol without IV contrast. COMPARISON:  Multiple exams, including CT cervical spine from 10/28/2018 FINDINGS: CT CHEST FINDINGS Cardiovascular: Left anterior descending coronary artery and aortic arch atherosclerotic calcification. Mediastinum/Nodes: Bulky mediastinal adenopathy. Index right lower paratracheal node 2.4 cm in short axis on image 26/3. Index left lower paratracheal/AP window lymph node 2.5 cm in short axis on image 26/3. Prominent subcarinal and right hilar adenopathy. 3.0 by 2.3 cm left thyroid nodule. Lungs/Pleura: Volume loss and airspace opacity fills most of the right lower lobe, with narrowing of the right middle lobe and right lower lobe bronchi possibly from central tumor scared by the surrounding airspace opacity. Today's exam did not include IV contrast and accordingly it is especially difficult to separate atelectatic/consolidated lung from tumor in this vicinity. Mild bandlike atelectasis in the right middle lobe along with some very faint nodularity along the minor fissure. Indistinctly marginated peripheral subpleural nodularity in the right upper lobe posteriorly measures 0.8 by 0.7 cm on image 57/4. 4 mm left lower lobe pulmonary nodule on image 102/4. Bronchocele versus less likely dilated vessels from vascular malformation in the left lower lobe for example on image 81/4. Musculoskeletal: Potential 1.3 cm lucent lesion in the right sternal  manubrium. Potential speckled small indistinct lucencies in thoracic vertebra although these are somewhat subtle. CT ABDOMEN PELVIS FINDINGS The patient's arm position by his sides causes streak artifact which obscure posterior regions in the upper abdomen, streak artifact from the patient's lumbar hardware and bilateral hip implants likewise reduces diagnostic sensitivity. Hepatobiliary: No appreciable mass. Please note that IV contrast was not given on today's exam. High density in the gallbladder probably from sludge. No appreciable biliary dilatation.  Pancreas: Unremarkable Spleen: Unremarkable Adrenals/Urinary Tract: 2.8 by 2.0 cm left adrenal mass with internal density 27 Hounsfield units, nonspecific. Similar nonspecific 2.0 by 1.1 cm right adrenal mass. Exophytic left mid to lower kidney lesion 1.3 cm in diameter on image 71/3, otherwise nonspecific. Possible slightly hyperdense lesion of the left mid kidney measuring 2.5 by 1.9 cm on image 64/3, partially obscured by streak artifact from the patient's arms. No discrete urinary tract calculi are identified, although the distal ureters in urinary bladder are completely obscured by streak artifact from the patient's hip implants. Suspected urinary bladder catheter Stomach/Bowel: Unremarkable Vascular/Lymphatic: Aortoiliac atherosclerotic vascular disease. No pathologic adenopathy in the abdomen/pelvis. Reproductive: Prostate gland obscured by streak artifact from the patient's hip implants. Other: No supplemental non-categorized findings. Musculoskeletal: Lucency along the left upper acetabulum is nonspecific but could possibly be from particulate disease. A 6 mm lucency in the right iliac bone posterolateral rod and pedicle screw fixation at L3-L4-L5-S1 with posterior decompression. Chronic appearing grade 1 anterolisthesis at L4-5. Slight inferior endplate wedging at L2. There is likely central narrowing of the thecal sac at L2-3 due to spurring and disc  bulge. IMPRESSION: 1. Bulky adenopathy in the chest favoring malignancy. Given the narrowing of the right lower lobe and right middle lobe bronchi and airspace opacity and volume loss in the right lower lobe, the possibility of a right infrahilar tumor is raised although today's noncontrast CT imaging makes it difficult to differentiate tumor from consolidated lung. Lymphoma with extrinsic nodal narrowing of the right middle lobe and right lower lobe bronchi is a differential diagnostic consideration. 2. There several scattered lucencies in the thoracic vertebra, sternal manubrium, and right iliac bone which could reflect myeloma or small metastatic lesions. Nuclear medicine PET-CT could be helpful in further characterization. 3. 2.8 by 2.0 cm nonspecific left adrenal mass. 2.0 by 1.1 cm nonspecific right adrenal mass. 4. Small nodules in the right upper lobe and left lower lobe are nonspecific but merit surveillance. Probable bronchocele in the left lower lobe. 5. Other imaging findings of potential clinical significance: Aortic Atherosclerosis (ICD10-I70.0). Coronary atherosclerosis. Postoperative findings in the lumbar spine. Suspected central narrowing of the thecal sac at L2-3. Electronically Signed   By: Van Clines M.D.   On: 10/28/2018 07:54   Ct Cervical Spine Wo Contrast  Result Date: 10/28/2018 CLINICAL DATA:  Found unresponsive on floor.  Hypertension. EXAM: CT CERVICAL SPINE WITHOUT CONTRAST TECHNIQUE: Multidetector CT imaging of the cervical spine was performed without intravenous contrast. Multiplanar CT image reconstructions were also generated. COMPARISON:  CT head 11/13/2018 FINDINGS: Alignment: AP alignment is anatomic. Mild leftward curvature is present. Skull base and vertebrae: Craniocervical junction is normal. No acute or healing fractures are present. Multiple well-defined lucencies are present in the cervical spine. Three discrete lesions are present at C3, the largest measures 4  mm inferiorly. A 3 mm hypodense lesion is present inferiorly at C4 a smaller lesion is present superiorly at C5. A 2 mm lesion is present left at C6. A 4 mm lesion is present at T2. No definite posterior element lesions are present. Soft tissues and spinal canal: The left lobe of the thyroid is heterogeneously enlarged. A dominant nodule measures 2.3 x 2.1 cm. Paratracheal adenopathy is present at the thoracic inlet. Disc levels: Uncovertebral and facet hypertrophy contribute to right greater than left foraminal narrowing at C5-6 and C6-7. Uncovertebral disease contributes to left foraminal narrowing at C3-4. Upper chest: Extensive mediastinal adenopathy is present. Lung apices are clear. IMPRESSION: 1.  No acute trauma. 2. Multiple well-defined hypodense lytic lesions throughout the cervical spine. The largest lesions are at C3 and T2. Findings are highly concerning for metastatic disease to the bone. 3. Extensive mediastinal adenopathy compatible with metastatic disease. 4. Asymmetric enlargement left thyroid with a dominant nodule measuring 2.3 x 2.1 cm. This most likely represents a thyroid nodule. Metastatic disease to the thyroid is considered much less likely. Electronically Signed   By: San Morelle M.D.   On: 10/28/2018 05:58   Dg Chest Portable 1 View  Result Date: 10/23/2018 CLINICAL DATA:  Found unresponsive with low oxygen saturation. EXAM: PORTABLE CHEST 1 VIEW COMPARISON:  04/09/2013 FINDINGS: Endotracheal tube tip is 4 cm above the carina. Orogastric or nasogastric tube enters the abdomen. The left lung is clear. There is abnormal opacity in the medial right lower lung. This could represent pneumonia or a mass lesion. No apparent effusion. IMPRESSION: Endotracheal tube in good position. Abnormal opacity in the right medial lower chest could be pneumonia or a mass lesion. Electronically Signed   By: Nelson Chimes M.D.   On: 10/26/2018 20:33    PMH:   Past Medical History:  Diagnosis Date   . Abnormal EKG    negative stress nuclear-2005; probable inferolateral scarring in 2007- negative EKG at 10 mets   . Bilateral sciatica   . Chronic back pain   . Chronic hip pain, bilateral   . Diabetes mellitus    AODM- no insulin- fasting cbg 100-150  . 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 GERD (gastroesophageal reflux disease)   . H/O hiatal hernia   . HLD (hyperlipidemia)   . HTN (hypertension)   . Hx of dizziness   . Neuropathy   . Obesity   . Paresthesia of foot, bilateral   . Shortness of breath    exertion  . Tobacco abuse    70 pack years    PSH:   Past Surgical History:  Procedure Laterality Date  . BIOPSY  07/11/2015   Procedure: BIOPSY;  Surgeon: Danie Binder, MD;  Location: [facility];  Service: Endoscopy;;  . CATARACT EXTRACTION  2011-12   Bilateral  . COLONOSCOPY     At [facility]: polypectomy x 3 via resection, all with fragments of tubular adenoma.  . COLONOSCOPY WITH PROPOFOL N/A 07/11/2015   Procedure: COLONOSCOPY WITH PROPOFOL;  Surgeon: Danie Binder, MD;  Location: [facility];  Service: Endoscopy;  Laterality: N/A;  200 - per office, pt can't come earlier  . HERNIA REPAIR Bilateral 1997  . JOINT REPLACEMENT     bilateral hip   . LUMBAR SPINE SURGERY  2004   Laminectomy and fusion  . POLYPECTOMY  07/11/2015   Procedure: POLYPECTOMY biopsy multiple;  Surgeon: Danie Binder, MD;  Location: [facility];  Service: Endoscopy;;  ceacl polyp APC used  . TOTAL HIP ARTHROPLASTY  2005   Left    Allergies: No Known Allergies  Medications:   Prior to Admission medications   Medication Sig Start Date End Date Taking? Authorizing Provider  Cholecalciferol (VITAMIN D) 1000 UNITS capsule Take 1,000 Units by mouth daily.      [provider]  doxazosin (CARDURA) 4 MG tablet Take 1 tablet by mouth  daily 07/27/15   Arnoldo Lenis, MD  esomeprazole (NEXIUM) 40 MG capsule Take 40 mg by mouth daily.      [provider]  magnesium oxide (MAG-OX) 400 MG tablet Take  1 tablet (400 mg total) by mouth 2 (two) times daily. 09/21/18   Barton Dubois, MD  meloxicam (MOBIC) 15 MG tablet Take 15 mg by mouth daily.  07/30/12   [provider]  metFORMIN (GLUCOPHAGE) 1000 MG tablet Take 1 tablet by mouth 2 (two) times daily. 07/03/18   [provider]  methocarbamol (ROBAXIN) 500 MG tablet Take 1 tablet (500 mg total) by mouth 4 (four) times daily. 01/20/18   Evalee Jefferson, PA-C  metoprolol succinate (TOPROL-XL) 100 MG 24 hr tablet Take 1 tablet by mouth  daily with or immediatley  following a meal 10/02/15   Arnoldo Lenis, MD  oxyCODONE-acetaminophen (PERCOCET/ROXICET) 5-325 MG tablet Take 1 tablet by mouth every 12 (twelve) hours as needed for severe pain. 09/21/18   Barton Dubois, MD  pioglitazone (ACTOS) 15 MG tablet Take 1 tablet by mouth daily. 08/31/18   [provider]  potassium chloride SA (K-DUR) 20 MEQ tablet Take 2 tablets (40 mEq total) by mouth daily. 09/22/18   Barton Dubois, MD  pravastatin (PRAVACHOL) 80 MG tablet Take 1 tablet by mouth  every evening 10/02/15   Arnoldo Lenis, MD    Discontinued Meds:   Medications Discontinued During This Encounter  Medication Reason  . vancomycin (VANCOCIN) IVPB 1000 mg/200 mL premix   . MEDLINE mouth rinse   . chlorhexidine gluconate (MEDLINE KIT) (PERIDEX) 0.12 % solution 15 mL Duplicate  . sodium bicarbonate 100 mEq in sterile water 1,000 mL infusion   . sodium bicarbonate 150 mEq in sterile water 1,050 mL infusion   . sodium bicarbonate 150 mEq in sterile water 1,000 mL infusion Entry Error    Social History:  reports that he has been smoking cigarettes. He has a 100.00 pack-year smoking history. He has never used smokeless tobacco. He reports current alcohol use. He reports that he does not use drugs.  Family History:   Family History  Problem Relation Age of Onset  . Stroke Mother   . Colon cancer Neg Hx     Blood  pressure (!) 126/96, pulse (!) 119, temperature 97.7 F (36.5 C), resp. rate (!) 26, height '5\' 9"'  (1.753 m), weight 99.1 kg, SpO2 96 %. General appearance: intubated Head: Normocephalic, without obvious abnormality, atraumatic Eyes: negative Back: symmetric, no curvature. ROM normal. No CVA tenderness. Resp: rhonchi bilaterally Chest wall: no tenderness Cardio: S1, S2 normal GI: soft, non-tender; bowel sounds normal; no masses,  no organomegaly Extremities: extremities normal, atraumatic, no cyanosis or edema Pulses: 2+ and symmetric Skin:  No rashes but there are ulcerations Lymph nodes: Cervical adenopathy: none       Dakota Stangl, Hunt Oris, MD 10/28/2018, 11:52 AM

## 2018-10-28 NOTE — Progress Notes (Signed)
eLink Physician-Brief Progress Note Patient Name: Sean [name] DOB: 09/15/1950 MRN: [id number]   Date of Service  10/28/2018  HPI/Events of Note  Hyperglucemia - Blood glucose = 175 --> 176.  eICU Interventions  Will order: 1. Q 4 hour sensitive Novolog SSI.      Intervention Category Major Interventions: Hyperglycemia - active titration of insulin therapy  Lysle Dingwall 10/28/2018, 9:33 PM

## 2018-10-28 NOTE — Progress Notes (Signed)
eLink Physician-Brief Progress Note Patient Name: CHANCELLOR VANDERLOOP DOB: [birth year] MRN: [id number]   Date of Service  10/28/2018  HPI/Events of Note  68 yo male found obtunded. Hypoxia and GCS = 8 --> intubated and ventilated for airway protection. Head CT negative. Rapid COVID negative. ARF with Creatinine = 4.7. Started on empiric Abx Rx with Vancomycin/Cefepime and Flagyl. Being transferred to [facility] for further management. VSS.  eICU Interventions  No new orders.      Intervention Category Evaluation Type: New Patient Evaluation  Lysle Dingwall 10/28/2018, 1:49 AM

## 2018-10-28 NOTE — Progress Notes (Signed)
PHARMACY - PHYSICIAN COMMUNICATION CRITICAL VALUE ALERT - BLOOD CULTURE IDENTIFICATION (BCID)  Sean [name] is an 68 y.o. male found unresponsive and hypoxic at home. Currently on vanc and cefepime for sepsis work up. BCID now showing staph species with mec A detected. WBC elevated to 24.6, SCR trending down to 3.94, afebrile  Assessment:  Bacteremia with staph species mec A detected in one of four bottles. Resp culture showing GPC, GPR, and GNR  Name of physician (or Provider) Contacted: Dr. Ruthann Cancer  Current antibiotics: Vanc and cefepime  Changes to prescribed antibiotics recommended:  Will continue vanc and cefepime and monitor cultures and clinical status  Results for orders placed or performed during the hospital encounter of 11/17/2018  Blood Culture ID Panel (Reflexed) (Collected: 10/25/2018  8:00 PM)  Result Value Ref Range   Enterococcus species NOT DETECTED NOT DETECTED   Listeria monocytogenes NOT DETECTED NOT DETECTED   Staphylococcus species DETECTED (A) NOT DETECTED   Staphylococcus aureus (BCID) NOT DETECTED NOT DETECTED   Methicillin resistance DETECTED (A) NOT DETECTED   Streptococcus species NOT DETECTED NOT DETECTED   Streptococcus agalactiae NOT DETECTED NOT DETECTED   Streptococcus pneumoniae NOT DETECTED NOT DETECTED   Streptococcus pyogenes NOT DETECTED NOT DETECTED   Acinetobacter baumannii NOT DETECTED NOT DETECTED   Enterobacteriaceae species NOT DETECTED NOT DETECTED   Enterobacter cloacae complex NOT DETECTED NOT DETECTED   Escherichia coli NOT DETECTED NOT DETECTED   Klebsiella oxytoca NOT DETECTED NOT DETECTED   Klebsiella pneumoniae NOT DETECTED NOT DETECTED   Proteus species NOT DETECTED NOT DETECTED   Serratia marcescens NOT DETECTED NOT DETECTED   Haemophilus influenzae NOT DETECTED NOT DETECTED   Neisseria meningitidis NOT DETECTED NOT DETECTED   Pseudomonas aeruginosa NOT DETECTED NOT DETECTED   Candida albicans NOT DETECTED NOT DETECTED   Candida glabrata NOT DETECTED NOT DETECTED   Candida krusei NOT DETECTED NOT DETECTED   Candida parapsilosis NOT DETECTED NOT DETECTED   Candida tropicalis NOT DETECTED NOT DETECTED     Juanell Fairly, PharmD PGY1 Pharmacy Resident 10/28/2018  5:53 PM

## 2018-10-28 NOTE — Progress Notes (Signed)
Lower extremity venous has been completed.   Preliminary results in CV Proc.   Results given to RN.   Sean [name] 10/28/2018 4:14 PM

## 2018-10-28 NOTE — Progress Notes (Signed)
Updated brother via phone just now as that is number we have on file in computer.   Will attmempt to get son's number to update him. Pt's brother did not have the phone number but he states the pt does have two sons.

## 2018-10-28 NOTE — Progress Notes (Signed)
Inpatient Diabetes Program Recommendations  AACE/ADA: New Consensus Statement on Inpatient Glycemic Control (2015)  Target Ranges:  Prepandial:   less than 140 mg/dL      Peak postprandial:   less than 180 mg/dL (1-2 hours)      Critically ill patients:  140 - 180 mg/dL   Lab Results  Component Value Date   GLUCAP 196 (H) 10/28/2018   HGBA1C 5.7 (H) 09/18/2018    Review of Glycemic Control Results for DUY, LEMMING (MRN [id number]) as of 10/28/2018 11:48  Ref. Range 10/28/2018 01:55 10/28/2018 04:07 10/28/2018 07:50 10/28/2018 11:21  Glucose-Capillary Latest Ref Range: 70 - 99 mg/dL 183 (H) 220 (H) 184 (H) 196 (H)   Diabetes history: DM 2 Outpatient Diabetes medications:  Metformin 1000 mg bid, Actos 15 mg daily Current orders for Inpatient glycemic control:  None Inpatient Diabetes Program Recommendations:    Please add Novolog correction/ ICU order set, sensitive 1-2-3 q 4 hours.   Thanks,  Adah Perl, RN, BC-ADM Inpatient Diabetes Coordinator Pager [phone] (8a-5p)

## 2018-10-28 NOTE — Progress Notes (Signed)
Attempts made to call son unable to reach.  Will update tomorrow if able to reach.   Did consult pharm for heparin in light of LE DVT suspect pe with recent hypoxic events as well.   Adding continuous sedation with agitation.   rn updated via phone

## 2018-10-29 ENCOUNTER — Inpatient Hospital Stay (HOSPITAL_COMMUNITY): Payer: Medicare Other

## 2018-10-29 DIAGNOSIS — I361 Nonrheumatic tricuspid (valve) insufficiency: Secondary | ICD-10-CM

## 2018-10-29 DIAGNOSIS — J9601 Acute respiratory failure with hypoxia: Secondary | ICD-10-CM

## 2018-10-29 LAB — CULTURE, BLOOD (ROUTINE X 2): Special Requests: ADEQUATE

## 2018-10-29 LAB — CBC
HCT: 36 % — ABNORMAL LOW (ref 39.0–52.0)
Hemoglobin: 12.2 g/dL — ABNORMAL LOW (ref 13.0–17.0)
MCH: 29.8 pg (ref 26.0–34.0)
MCHC: 33.9 g/dL (ref 30.0–36.0)
MCV: 88 fL (ref 80.0–100.0)
Platelets: 115 10*3/uL — ABNORMAL LOW (ref 150–400)
RBC: 4.09 MIL/uL — ABNORMAL LOW (ref 4.22–5.81)
RDW: 15.2 % (ref 11.5–15.5)
WBC: 22.5 10*3/uL — ABNORMAL HIGH (ref 4.0–10.5)
nRBC: 0.4 % — ABNORMAL HIGH (ref 0.0–0.2)

## 2018-10-29 LAB — COMPREHENSIVE METABOLIC PANEL WITH GFR
ALT: 34 U/L (ref 0–44)
AST: 21 U/L (ref 15–41)
Albumin: 1.8 g/dL — ABNORMAL LOW (ref 3.5–5.0)
Alkaline Phosphatase: 68 U/L (ref 38–126)
Anion gap: 13 (ref 5–15)
BUN: 100 mg/dL — ABNORMAL HIGH (ref 8–23)
CO2: 28 mmol/L (ref 22–32)
Calcium: 9.6 mg/dL (ref 8.9–10.3)
Chloride: 104 mmol/L (ref 98–111)
Creatinine, Ser: 3.42 mg/dL — ABNORMAL HIGH (ref 0.61–1.24)
GFR calc Af Amer: 20 mL/min — ABNORMAL LOW
GFR calc non Af Amer: 18 mL/min — ABNORMAL LOW
Glucose, Bld: 146 mg/dL — ABNORMAL HIGH (ref 70–99)
Potassium: 3.2 mmol/L — ABNORMAL LOW (ref 3.5–5.1)
Sodium: 145 mmol/L (ref 135–145)
Total Bilirubin: 1.7 mg/dL — ABNORMAL HIGH (ref 0.3–1.2)
Total Protein: 4.5 g/dL — ABNORMAL LOW (ref 6.5–8.1)

## 2018-10-29 LAB — GLUCOSE, CAPILLARY
Glucose-Capillary: 103 mg/dL — ABNORMAL HIGH (ref 70–99)
Glucose-Capillary: 112 mg/dL — ABNORMAL HIGH (ref 70–99)
Glucose-Capillary: 135 mg/dL — ABNORMAL HIGH (ref 70–99)
Glucose-Capillary: 136 mg/dL — ABNORMAL HIGH (ref 70–99)
Glucose-Capillary: 181 mg/dL — ABNORMAL HIGH (ref 70–99)
Glucose-Capillary: 186 mg/dL — ABNORMAL HIGH (ref 70–99)

## 2018-10-29 LAB — PROTEIN ELECTROPHORESIS, SERUM
A/G Ratio: 0.8 (ref 0.7–1.7)
Albumin ELP: 2.5 g/dL — ABNORMAL LOW (ref 2.9–4.4)
Alpha-1-Globulin: 0.3 g/dL (ref 0.0–0.4)
Alpha-2-Globulin: 1.4 g/dL — ABNORMAL HIGH (ref 0.4–1.0)
Beta Globulin: 0.9 g/dL (ref 0.7–1.3)
Gamma Globulin: 0.6 g/dL (ref 0.4–1.8)
Globulin, Total: 3.1 g/dL (ref 2.2–3.9)
Total Protein ELP: 5.6 g/dL — ABNORMAL LOW (ref 6.0–8.5)

## 2018-10-29 LAB — POCT I-STAT 7, (LYTES, BLD GAS, ICA,H+H)
Acid-Base Excess: 6 mmol/L — ABNORMAL HIGH (ref 0.0–2.0)
Bicarbonate: 31.7 mmol/L — ABNORMAL HIGH (ref 20.0–28.0)
Calcium, Ion: 1.39 mmol/L (ref 1.15–1.40)
HCT: 39 % (ref 39.0–52.0)
Hemoglobin: 13.3 g/dL (ref 13.0–17.0)
O2 Saturation: 93 %
Potassium: 3.4 mmol/L — ABNORMAL LOW (ref 3.5–5.1)
Sodium: 142 mmol/L (ref 135–145)
TCO2: 33 mmol/L — ABNORMAL HIGH (ref 22–32)
pCO2 arterial: 48 mmHg (ref 32.0–48.0)
pH, Arterial: 7.427 (ref 7.350–7.450)
pO2, Arterial: 67 mmHg — ABNORMAL LOW (ref 83.0–108.0)

## 2018-10-29 LAB — HEPARIN LEVEL (UNFRACTIONATED)
Heparin Unfractionated: 0.57 IU/mL (ref 0.30–0.70)
Heparin Unfractionated: 0.55 [IU]/mL (ref 0.30–0.70)

## 2018-10-29 LAB — LACTIC ACID, PLASMA
Lactic Acid, Venous: 2.7 mmol/L (ref 0.5–1.9)
Lactic Acid, Venous: 2.3 mmol/L (ref 0.5–1.9)
Lactic Acid, Venous: 2.3 mmol/L (ref 0.5–1.9)

## 2018-10-29 LAB — MAGNESIUM: Magnesium: 1.3 mg/dL — ABNORMAL LOW (ref 1.7–2.4)

## 2018-10-29 LAB — PHOSPHORUS: Phosphorus: 2.7 mg/dL (ref 2.5–4.6)

## 2018-10-29 LAB — MYOGLOBIN, SERUM: Myoglobin: 1730 ng/mL — ABNORMAL HIGH (ref 28–72)

## 2018-10-29 LAB — CK
Total CK: 200 U/L (ref 49–397)
Total CK: 165 U/L (ref 49–397)

## 2018-10-29 MED ORDER — SODIUM CHLORIDE 0.9 % IV BOLUS
1000.0000 mL | Freq: Once | INTRAVENOUS | Status: AC
  Administered 2018-10-29: 1000 mL via INTRAVENOUS

## 2018-10-29 MED ORDER — MAGNESIUM SULFATE 4 GM/100ML IV SOLN
4.0000 g | Freq: Once | INTRAVENOUS | Status: AC
  Administered 2018-10-29: 4 g via INTRAVENOUS
  Filled 2018-10-29: qty 100

## 2018-10-29 MED ORDER — PRO-STAT SUGAR FREE PO LIQD
60.0000 mL | Freq: Three times a day (TID) | ORAL | Status: DC
  Administered 2018-10-29 – 2018-10-31 (×6): 60 mL
  Filled 2018-10-29 (×6): qty 60

## 2018-10-29 MED ORDER — FREE WATER
60.0000 mL | Status: DC
  Administered 2018-10-29 – 2018-10-31 (×12): 60 mL

## 2018-10-29 MED ORDER — POTASSIUM CHLORIDE 20 MEQ/15ML (10%) PO SOLN
40.0000 meq | Freq: Once | ORAL | Status: AC
  Administered 2018-10-29: 40 meq via ORAL

## 2018-10-29 MED ORDER — POTASSIUM CHLORIDE 20 MEQ/15ML (10%) PO SOLN
40.0000 meq | Freq: Two times a day (BID) | ORAL | Status: DC
  Administered 2018-10-29 – 2018-10-31 (×4): 40 meq via ORAL
  Filled 2018-10-29 (×5): qty 30

## 2018-10-29 MED ORDER — SODIUM CHLORIDE 0.9 % IV SOLN
INTRAVENOUS | Status: DC
  Administered 2018-10-29 – 2018-10-31 (×4): via INTRAVENOUS

## 2018-10-29 MED ORDER — VITAL AF 1.2 CAL PO LIQD
1000.0000 mL | ORAL | Status: DC
  Administered 2018-10-29 – 2018-10-31 (×3): 1000 mL

## 2018-10-29 MED ORDER — POTASSIUM CHLORIDE 20 MEQ PO PACK
40.0000 meq | PACK | Freq: Two times a day (BID) | ORAL | Status: DC
  Filled 2018-10-29 (×2): qty 2

## 2018-10-29 NOTE — Progress Notes (Signed)
Pt was beginning to wake up on lightened sedation.  Pt began to desat to 80%.  Recruitment maneuver performed w/ sats improving briefly to 85-86%, but then desat back to 83%.  RN re-sedated pt again.  Discussed w/ CCM, peep increased to 16.  Sat slowly improved to 87%

## 2018-10-29 NOTE — Progress Notes (Signed)
[facility] Progress Note    Assessment/ Plan:   68 y.o. male  HTN DM HLD smoker (>100PY) recent admission for AKI and UTI brought in by EMS after being found unresponse in the bathroom after law enforcement performed a welfare check. Intubated in the ED and found to be hypernatremic, hypercalcemic and in renal failure.  He was also found to be hypercalcemic during an admission 09/18/2018 when he presented with generalized weakness; Ca was 14.5 at that time w/ a creatinine of 2.09 but came down to the 1-1.3 range @ time of d/c. COVID neg.   1. Acute kidney injury on CKD w/ CK 655 and e/o prerenal azotemia with a FeNA <1% responding to fluids. Urine microscopy is not active. CKD component from HTN and DM with a BL creatinine in the 1-1.3 range. Acute component appears to be from prerenal azotemia. 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  - Continue aggressive hydration as you are doing; he is responding already. Likely significantly volume down from immobility + hypercalcemia leading decreased activity of aquaporin2 channels -> will have to monitor UOP closely as it may be dropping off.  - will order BMet -> UOP is dropping off.   2. Hypercalcemia - already trending down with hydration from 13.6 to 12 as of 6/10. - SPEP, UPEP, PTH, PTHrP P -  Already on calcitonin BID + pamidronate x1.   3. Hypernatremia - resolved. 4. Prostate Ca - PSA is 41.8 5. DVT - acute deep vein thrombosis involving right popliteal vein, left common femoral vein, left femoral vein, left proximal profunda vein, left popliteal vein, and left posterior tibial vein.   Subjective:   Intubated UOP decreasing    Objective:   BP 124/82   Pulse (!) 112   Temp 97.7 F (36.5 C)   Resp 19   Ht '5\' 9"'  (1.753 m)   Wt 104.8 kg   SpO2 90%   BMI 34.12 kg/m   Intake/Output Summary (Last 24 hours) at  10/29/2018 1029 Last data filed at 10/29/2018 3790 Gross per 24 hour  Intake 2665.74 ml  Output 595 ml  Net 2070.74 ml   Weight change: -5.8 kg  Physical Exam: GEN: Intubated HEENT: Conjunctival pallor LUNGS: CTA B/L intubated CV: RRR, No M/R/G ABD: SNDNT +BS  EXT: Minimal lower extremity edema GU: Foley to gravity   Imaging: Ct Abdomen Pelvis Wo Contrast  Result Date: 10/28/2018 CLINICAL DATA:  The patient was found unresponsive and CT workup of the head neck revealed adenopathy and lytic bony lesions. Chest radiography revealed a right lower lobe lesion for further workup. EXAM: CT CHEST, ABDOMEN AND PELVIS WITHOUT CONTRAST TECHNIQUE: Multidetector CT imaging of the chest, abdomen and pelvis was performed following the standard protocol without IV contrast. COMPARISON:  Multiple exams, including CT cervical spine from 10/28/2018 FINDINGS: CT CHEST FINDINGS Cardiovascular: Left anterior descending coronary artery and aortic arch atherosclerotic calcification. Mediastinum/Nodes: Bulky mediastinal adenopathy. Index right lower paratracheal node 2.4 cm in short axis on image 26/3. Index left lower paratracheal/AP window lymph node 2.5 cm in short axis on image 26/3. Prominent subcarinal and right hilar adenopathy. 3.0 by 2.3 cm left thyroid nodule. Lungs/Pleura: Volume loss and airspace opacity fills most of the right lower lobe, with narrowing of the right middle lobe and right lower lobe bronchi possibly from central tumor scared by the surrounding airspace opacity. Today's  exam did not include IV contrast and accordingly it is especially difficult to separate atelectatic/consolidated lung from tumor in this vicinity. Mild bandlike atelectasis in the right middle lobe along with some very faint nodularity along the minor fissure. Indistinctly marginated peripheral subpleural nodularity in the right upper lobe posteriorly measures 0.8 by 0.7 cm on image 57/4. 4 mm left lower lobe pulmonary nodule  on image 102/4. Bronchocele versus less likely dilated vessels from vascular malformation in the left lower lobe for example on image 81/4. Musculoskeletal: Potential 1.3 cm lucent lesion in the right sternal manubrium. Potential speckled small indistinct lucencies in thoracic vertebra although these are somewhat subtle. CT ABDOMEN PELVIS FINDINGS The patient's arm position by his sides causes streak artifact which obscure posterior regions in the upper abdomen, streak artifact from the patient's lumbar hardware and bilateral hip implants likewise reduces diagnostic sensitivity. Hepatobiliary: No appreciable mass. Please note that IV contrast was not given on today's exam. High density in the gallbladder probably from sludge. No appreciable biliary dilatation. Pancreas: Unremarkable Spleen: Unremarkable Adrenals/Urinary Tract: 2.8 by 2.0 cm left adrenal mass with internal density 27 Hounsfield units, nonspecific. Similar nonspecific 2.0 by 1.1 cm right adrenal mass. Exophytic left mid to lower kidney lesion 1.3 cm in diameter on image 71/3, otherwise nonspecific. Possible slightly hyperdense lesion of the left mid kidney measuring 2.5 by 1.9 cm on image 64/3, partially obscured by streak artifact from the patient's arms. No discrete urinary tract calculi are identified, although the distal ureters in urinary bladder are completely obscured by streak artifact from the patient's hip implants. Suspected urinary bladder catheter Stomach/Bowel: Unremarkable Vascular/Lymphatic: Aortoiliac atherosclerotic vascular disease. No pathologic adenopathy in the abdomen/pelvis. Reproductive: Prostate gland obscured by streak artifact from the patient's hip implants. Other: No supplemental non-categorized findings. Musculoskeletal: Lucency along the left upper acetabulum is nonspecific but could possibly be from particulate disease. A 6 mm lucency in the right iliac bone posterolateral rod and pedicle screw fixation at L3-L4-L5-S1  with posterior decompression. Chronic appearing grade 1 anterolisthesis at L4-5. Slight inferior endplate wedging at L2. There is likely central narrowing of the thecal sac at L2-3 due to spurring and disc bulge. IMPRESSION: 1. Bulky adenopathy in the chest favoring malignancy. Given the narrowing of the right lower lobe and right middle lobe bronchi and airspace opacity and volume loss in the right lower lobe, the possibility of a right infrahilar tumor is raised although today's noncontrast CT imaging makes it difficult to differentiate tumor from consolidated lung. Lymphoma with extrinsic nodal narrowing of the right middle lobe and right lower lobe bronchi is a differential diagnostic consideration. 2. There several scattered lucencies in the thoracic vertebra, sternal manubrium, and right iliac bone which could reflect myeloma or small metastatic lesions. Nuclear medicine PET-CT could be helpful in further characterization. 3. 2.8 by 2.0 cm nonspecific left adrenal mass. 2.0 by 1.1 cm nonspecific right adrenal mass. 4. Small nodules in the right upper lobe and left lower lobe are nonspecific but merit surveillance. Probable bronchocele in the left lower lobe. 5. Other imaging findings of potential clinical significance: Aortic Atherosclerosis (ICD10-I70.0). Coronary atherosclerosis. Postoperative findings in the lumbar spine. Suspected central narrowing of the thecal sac at L2-3. Electronically Signed   By: Van Clines M.D.   On: 10/28/2018 07:54   Ct Head Wo Contrast  Result Date: 10/23/2018 CLINICAL DATA:  Altered level of consciousness. EXAM: CT HEAD WITHOUT CONTRAST TECHNIQUE: Contiguous axial images were obtained from the base of the skull through the  vertex without intravenous contrast. COMPARISON:  None. FINDINGS: Brain: No evidence of acute infarction, hemorrhage, hydrocephalus, extra-axial collection or mass lesion/mass effect. Vascular: No hyperdense vessel or unexpected calcification.  Skull: Normal. Negative for fracture or focal lesion. Sinuses/Orbits: Bilateral ethmoid and frontal sinusitis is noted. Other: Moderate right parietal scalp hematoma is noted. IMPRESSION: Moderate right parietal scalp hematoma. Bilateral ethmoid and frontal sinusitis. No acute intracranial abnormality seen. Electronically Signed   By: Marijo Conception M.D.   On: 11/07/2018 21:11   Ct Chest Wo Contrast  Result Date: 10/28/2018 CLINICAL DATA:  The patient was found unresponsive and CT workup of the head neck revealed adenopathy and lytic bony lesions. Chest radiography revealed a right lower lobe lesion for further workup. EXAM: CT CHEST, ABDOMEN AND PELVIS WITHOUT CONTRAST TECHNIQUE: Multidetector CT imaging of the chest, abdomen and pelvis was performed following the standard protocol without IV contrast. COMPARISON:  Multiple exams, including CT cervical spine from 10/28/2018 FINDINGS: CT CHEST FINDINGS Cardiovascular: Left anterior descending coronary artery and aortic arch atherosclerotic calcification. Mediastinum/Nodes: Bulky mediastinal adenopathy. Index right lower paratracheal node 2.4 cm in short axis on image 26/3. Index left lower paratracheal/AP window lymph node 2.5 cm in short axis on image 26/3. Prominent subcarinal and right hilar adenopathy. 3.0 by 2.3 cm left thyroid nodule. Lungs/Pleura: Volume loss and airspace opacity fills most of the right lower lobe, with narrowing of the right middle lobe and right lower lobe bronchi possibly from central tumor scared by the surrounding airspace opacity. Today's exam did not include IV contrast and accordingly it is especially difficult to separate atelectatic/consolidated lung from tumor in this vicinity. Mild bandlike atelectasis in the right middle lobe along with some very faint nodularity along the minor fissure. Indistinctly marginated peripheral subpleural nodularity in the right upper lobe posteriorly measures 0.8 by 0.7 cm on image 57/4. 4 mm  left lower lobe pulmonary nodule on image 102/4. Bronchocele versus less likely dilated vessels from vascular malformation in the left lower lobe for example on image 81/4. Musculoskeletal: Potential 1.3 cm lucent lesion in the right sternal manubrium. Potential speckled small indistinct lucencies in thoracic vertebra although these are somewhat subtle. CT ABDOMEN PELVIS FINDINGS The patient's arm position by his sides causes streak artifact which obscure posterior regions in the upper abdomen, streak artifact from the patient's lumbar hardware and bilateral hip implants likewise reduces diagnostic sensitivity. Hepatobiliary: No appreciable mass. Please note that IV contrast was not given on today's exam. High density in the gallbladder probably from sludge. No appreciable biliary dilatation. Pancreas: Unremarkable Spleen: Unremarkable Adrenals/Urinary Tract: 2.8 by 2.0 cm left adrenal mass with internal density 27 Hounsfield units, nonspecific. Similar nonspecific 2.0 by 1.1 cm right adrenal mass. Exophytic left mid to lower kidney lesion 1.3 cm in diameter on image 71/3, otherwise nonspecific. Possible slightly hyperdense lesion of the left mid kidney measuring 2.5 by 1.9 cm on image 64/3, partially obscured by streak artifact from the patient's arms. No discrete urinary tract calculi are identified, although the distal ureters in urinary bladder are completely obscured by streak artifact from the patient's hip implants. Suspected urinary bladder catheter Stomach/Bowel: Unremarkable Vascular/Lymphatic: Aortoiliac atherosclerotic vascular disease. No pathologic adenopathy in the abdomen/pelvis. Reproductive: Prostate gland obscured by streak artifact from the patient's hip implants. Other: No supplemental non-categorized findings. Musculoskeletal: Lucency along the left upper acetabulum is nonspecific but could possibly be from particulate disease. A 6 mm lucency in the right iliac bone posterolateral rod and  pedicle screw fixation at L3-L4-L5-S1  with posterior decompression. Chronic appearing grade 1 anterolisthesis at L4-5. Slight inferior endplate wedging at L2. There is likely central narrowing of the thecal sac at L2-3 due to spurring and disc bulge. IMPRESSION: 1. Bulky adenopathy in the chest favoring malignancy. Given the narrowing of the right lower lobe and right middle lobe bronchi and airspace opacity and volume loss in the right lower lobe, the possibility of a right infrahilar tumor is raised although today's noncontrast CT imaging makes it difficult to differentiate tumor from consolidated lung. Lymphoma with extrinsic nodal narrowing of the right middle lobe and right lower lobe bronchi is a differential diagnostic consideration. 2. There several scattered lucencies in the thoracic vertebra, sternal manubrium, and right iliac bone which could reflect myeloma or small metastatic lesions. Nuclear medicine PET-CT could be helpful in further characterization. 3. 2.8 by 2.0 cm nonspecific left adrenal mass. 2.0 by 1.1 cm nonspecific right adrenal mass. 4. Small nodules in the right upper lobe and left lower lobe are nonspecific but merit surveillance. Probable bronchocele in the left lower lobe. 5. Other imaging findings of potential clinical significance: Aortic Atherosclerosis (ICD10-I70.0). Coronary atherosclerosis. Postoperative findings in the lumbar spine. Suspected central narrowing of the thecal sac at L2-3. Electronically Signed   By: Van Clines M.D.   On: 10/28/2018 07:54   Ct Cervical Spine Wo Contrast  Result Date: 10/28/2018 CLINICAL DATA:  Found unresponsive on floor.  Hypertension. EXAM: CT CERVICAL SPINE WITHOUT CONTRAST TECHNIQUE: Multidetector CT imaging of the cervical spine was performed without intravenous contrast. Multiplanar CT image reconstructions were also generated. COMPARISON:  CT head 11/14/2018 FINDINGS: Alignment: AP alignment is anatomic. Mild leftward curvature is  present. Skull base and vertebrae: Craniocervical junction is normal. No acute or healing fractures are present. Multiple well-defined lucencies are present in the cervical spine. Three discrete lesions are present at C3, the largest measures 4 mm inferiorly. A 3 mm hypodense lesion is present inferiorly at C4 a smaller lesion is present superiorly at C5. A 2 mm lesion is present left at C6. A 4 mm lesion is present at T2. No definite posterior element lesions are present. Soft tissues and spinal canal: The left lobe of the thyroid is heterogeneously enlarged. A dominant nodule measures 2.3 x 2.1 cm. Paratracheal adenopathy is present at the thoracic inlet. Disc levels: Uncovertebral and facet hypertrophy contribute to right greater than left foraminal narrowing at C5-6 and C6-7. Uncovertebral disease contributes to left foraminal narrowing at C3-4. Upper chest: Extensive mediastinal adenopathy is present. Lung apices are clear. IMPRESSION: 1. No acute trauma. 2. Multiple well-defined hypodense lytic lesions throughout the cervical spine. The largest lesions are at C3 and T2. Findings are highly concerning for metastatic disease to the bone. 3. Extensive mediastinal adenopathy compatible with metastatic disease. 4. Asymmetric enlargement left thyroid with a dominant nodule measuring 2.3 x 2.1 cm. This most likely represents a thyroid nodule. Metastatic disease to the thyroid is considered much less likely. Electronically Signed   By: San Morelle M.D.   On: 10/28/2018 05:58   Portable Chest Xray  Result Date: 10/29/2018 CLINICAL DATA:  Status post intubation EXAM: PORTABLE CHEST 1 VIEW COMPARISON:  10/27/2028 FINDINGS: Endotracheal tube and gastric catheter are again noted and stable. Right hilar fullness is again seen and stable. No bony abnormality is seen. IMPRESSION: Tubes and lines as described. Right hilar fullness similar to that seen on prior exams. Electronically Signed   By: Inez Catalina M.D.    On: 10/29/2018 08:35  Dg Chest Portable 1 View  Result Date: 10/28/2018 CLINICAL DATA:  Status post intubation EXAM: PORTABLE CHEST 1 VIEW COMPARISON:  10/28/2018 FINDINGS: Cardiac shadows within normal limits. Endotracheal tube and gastric catheter are seen. Fullness in the right hilar region and infrahilar region is noted consistent with that seen on recent CT examination. Lungs are otherwise clear. The degree of aeration in the right lower lobe has improved when compared with the prior exam. IMPRESSION: Right hilar fullness similar to that seen on recent CT examination. Electronically Signed   By: Inez Catalina M.D.   On: 10/28/2018 15:27   Dg Chest Portable 1 View  Result Date: 11/12/2018 CLINICAL DATA:  Found unresponsive with low oxygen saturation. EXAM: PORTABLE CHEST 1 VIEW COMPARISON:  04/09/2013 FINDINGS: Endotracheal tube tip is 4 cm above the carina. Orogastric or nasogastric tube enters the abdomen. The left lung is clear. There is abnormal opacity in the medial right lower lung. This could represent pneumonia or a mass lesion. No apparent effusion. IMPRESSION: Endotracheal tube in good position. Abnormal opacity in the right medial lower chest could be pneumonia or a mass lesion. Electronically Signed   By: Nelson Chimes M.D.   On: 10/26/2018 20:33   Vas [place] Lower Extremity Venous (dvt)  Result Date: 10/28/2018  Lower Venous Study Indications: Stroke, and underlying malignancy w/ hypoixa.  Performing Technologist: Abram Sander RVS  Examination Guidelines: A complete evaluation includes B-mode imaging, spectral Doppler, color Doppler, and power Doppler as needed of all accessible portions of each vessel. Bilateral testing is considered an integral part of a complete examination. Limited examinations for reoccurring indications may be performed as noted.  +---------+---------------+---------+-----------+----------+--------------+ RIGHT     CompressibilityPhasicitySpontaneityPropertiesSummary        +---------+---------------+---------+-----------+----------+--------------+ CFV      Full           Yes      Yes                                 +---------+---------------+---------+-----------+----------+--------------+ SFJ      Full                                                        +---------+---------------+---------+-----------+----------+--------------+ FV Prox  Full                                                        +---------+---------------+---------+-----------+----------+--------------+ FV Mid   Full                                                        +---------+---------------+---------+-----------+----------+--------------+ FV DistalFull                                                        +---------+---------------+---------+-----------+----------+--------------+ PFV  Full                                                        +---------+---------------+---------+-----------+----------+--------------+ POP      Partial        Yes      Yes                  Acute          +---------+---------------+---------+-----------+----------+--------------+ PTV      Full                                                        +---------+---------------+---------+-----------+----------+--------------+ PERO                                                  Not visualized +---------+---------------+---------+-----------+----------+--------------+   +---------+---------------+---------+-----------+----------+--------------+ LEFT     CompressibilityPhasicitySpontaneityPropertiesSummary        +---------+---------------+---------+-----------+----------+--------------+ CFV      None           No       No                   Acute          +---------+---------------+---------+-----------+----------+--------------+ SFJ      Full                                                         +---------+---------------+---------+-----------+----------+--------------+ FV Prox  None                                         Acute          +---------+---------------+---------+-----------+----------+--------------+ FV Mid   None                                         Acute          +---------+---------------+---------+-----------+----------+--------------+ FV DistalNone                                         Acute          +---------+---------------+---------+-----------+----------+--------------+ PFV      None                                         Acute          +---------+---------------+---------+-----------+----------+--------------+ POP      None           No  No                   Acute          +---------+---------------+---------+-----------+----------+--------------+ PTV      None                                         Acute          +---------+---------------+---------+-----------+----------+--------------+ PERO                                                  Not visualized +---------+---------------+---------+-----------+----------+--------------+     Summary: Right: Findings consistent with acute deep vein thrombosis involving the right popliteal vein. No cystic structure found in the popliteal fossa. Left: Findings consistent with acute deep vein thrombosis involving the left common femoral vein, left femoral vein, left proximal profunda vein, left popliteal vein, and left posterior tibial vein. No cystic structure found in the popliteal fossa.  *See table(s) above for measurements and observations. Electronically signed by Curt Jews MD on 10/28/2018 at 4:58:06 PM.    Final     Labs: BMET Recent Labs  Lab 11/16/2018 2000 11/04/2018 2237 10/28/18 0422 10/28/18 1221 10/29/18 0925  NA 149*  --  148* 145 142  K 4.3  --  4.0 4.5 3.4*  CL 108  --  111 107  --   CO2 21*  --  19* 22  --   GLUCOSE 182*  --  240* 174*   --   BUN 108*  --  105* 104*  --   CREATININE 4.75*  --  4.37* 3.94*  --   CALCIUM 13.6*  --  12.0* 10.8*  --   PHOS  --  4.9*  --   --   --    CBC Recent Labs  Lab 10/21/2018 2000 10/28/18 0422 10/29/18 0222 10/29/18 0925  WBC 24.7* 24.6* 22.5*  --   NEUTROABS 20.6*  --   --   --   HGB 15.6 14.6 12.2* 13.3  HCT 47.5 44.5 36.0* 39.0  MCV 90.6 90.4 88.0  --   PLT 253 170 115*  --     Medications:    . chlorhexidine gluconate (MEDLINE KIT)  15 mL Mouth Rinse BID  . Chlorhexidine Gluconate Cloth  6 each Topical Q0600  . free water  60 mL Per Tube Q4H  . insulin aspart  0-9 Units Subcutaneous Q4H  . mouth rinse  15 mL Mouth Rinse 10 times per day  . pantoprazole (PROTONIX) IV  40 mg Intravenous Q12H      Otelia Santee, MD 10/29/2018, 10:29 AM

## 2018-10-29 NOTE — Progress Notes (Addendum)
Nutrition Follow-up / Consult  DOCUMENTATION CODES:   Obesity unspecified, Non-severe (moderate) malnutrition in context of chronic illness  INTERVENTION:    Vital AF 1.2 at 30 ml/h (720 ml per day)   Pro-stat 60 ml TID   Provides 1464 kcal (1620 kcal total with lipid calories from propofol), 144 gm protein, 584 ml free water daily  NUTRITION DIAGNOSIS:   Moderate Malnutrition related to chronic illness as evidenced by mild muscle depletion, percent weight loss.  ongoing  GOAL:   Provide needs based on ASPEN/SCCM guidelines  Being addressed with TF initiation  MONITOR:   Vent status, Labs, Skin, I & O's  REASON FOR ASSESSMENT:   Consult Enteral/tube feeding initiation and management  ASSESSMENT:   68 yo male admitted with AMS/hypoxia after being found down at home during a welfare check. Intubated on admission. PMH includes HTN, tobacco abuse, HLD.  Per review of MD notes, patient's son reported that patient has lost around 50 lbs unintentionally over the past few months and has had a poor appetite.    Per review of weight encounters, patient has lost 23% of his usual weight over the past 9 months, significant for the time frame.   Received MD Consult for TF initiation and management. OG tube in place.  Patient is currently intubated on ventilator support MV: 10 L/min Temp (24hrs), Avg:98 F (36.7 C), Min:97.4 F (36.3 C), Max:98.6 F (37 C)  Propofol: 5.9 ml/hr providing 156 kcal from lipids  Labs reviewed. Potassium 3.2 (L), BUN 100 (H), creatinine 3.42 (H) CBG's: [phone] Medications reviewed and include Novolog and propofol.  Weight up 5.7 kg since yesterday. Noted UOP declining. I/O net +4.6 L  NUTRITION - FOCUSED PHYSICAL EXAM:    Most Recent Value  Orbital Region  No depletion  Upper Arm Region  No depletion  Thoracic and Lumbar Region  No depletion  Buccal Region  Unable to assess  Temple Region  Mild depletion  Clavicle Bone Region   Mild depletion  Clavicle and Acromion Bone Region  No depletion  Scapular Bone Region  Unable to assess  Dorsal Hand  Unable to assess  Patellar Region  No depletion  Anterior Thigh Region  No depletion  Posterior Calf Region  No depletion  Edema (RD Assessment)  None [facial edema per RN documentation]  Hair  Reviewed  Eyes  Unable to assess  Mouth  Unable to assess  Skin  Reviewed  Nails  Unable to assess       Diet Order:   Diet Order            Diet NPO time specified  Diet effective now              EDUCATION NEEDS:   No education needs have been identified at this time  Skin:  Skin Assessment: Skin Integrity Issues: Skin Integrity Issues:: Stage II, DTI DTI: heel, back, R flank, R buttocks, L leg Stage II: sacrum  Last BM:  PTA  Height:   Ht Readings from Last 1 Encounters:  10/28/18 5\' 9"  (1.753 m)    Weight:   Wt Readings from Last 1 Encounters:  10/29/18 104.8 kg    Ideal Body Weight:  72.7 kg  BMI:  Body mass index is 34.12 kg/m.  Estimated Nutritional Needs:   Kcal:  1350-1550  Protein:  145 gm  Fluid:  1.8 L    Molli Barrows, RD, LDN, CNSC Pager [phone] After Hours Pager [phone]

## 2018-10-29 NOTE — Progress Notes (Signed)
D/w son and Daughter in law again as they had questions about what comfort care would look like and what should they be doing for their loved one.   I explained to them that it is best to consider what their father had spoken with them about previously and what he would find an acceptable quality of life. They expressed he is independent and would not (actually has previously expressed adamantly he would not want NH placement). I explained that he has a long road of recovery, if possible. He will likely need NH placement for rehab and if he truly has cancer then he will need treatment if possible.   Ultimately I just reiterated his critical state with arf, hypoxic resp failure, suspected malignancy, encephalopathy. I explained that we can take this day by day and reassess. They want to ensure they honor their father's wishes. They will look at his advance directive tonight and await update tomorrow. They may wish to pursue comfort care if he is making no improvements from today to tomorrow based on what they learn in his advanced directive in conjunction with what he has previously expressed.   However, for now, they desire aggressive care and to be notified should he worsen so they may travel the 2hr to arrive here. Very reasonable and very appropriate discussion. All questions answered to the best of my ability.

## 2018-10-29 NOTE — Progress Notes (Signed)
ANTICOAGULATION CONSULT NOTE - Follow Up Consult  Pharmacy Consult for heparin Indication: DVT  Labs: Recent Labs    10/19/2018 2000 10/28/18 0422 10/28/18 1221 10/29/18 0222  HGB 15.6 14.6  --  12.2*  HCT 47.5 44.5  --  36.0*  PLT 253 170  --  115*  LABPROT 15.4*  --   --   --   INR 1.2  --   --   --   HEPARINUNFRC  --   --   --  0.57  CREATININE 4.75* 4.37* 3.94*  --   CKTOTAL 645* 655* 496*  --   TROPONINI 0.12* 0.08*  --   --     Assessment/Plan:  68yo male therapeutic on heparin with initial dosing for DVT. Will continue gtt at current rate and confirm stable with additional level.   Wynona Neat, PharmD, BCPS  10/29/2018,4:54 AM

## 2018-10-29 NOTE — Progress Notes (Signed)
NAME:  Sean [name], MRN:  [id number], DOB:  [birth year], LOS: 0 ADMISSION DATE:  11/12/2018, CONSULTATION DATE:  10/21/2018 REFERRING MD:  Dr. Laverta [facility], CHIEF COMPLAINT:  AMS/ hypoxia  Brief History   67 year old male found unresponsive and hypoxic at home on welfare check.  Required intubation for hypoxia and GCS of 8.  CTH negative.  Found to have AKI with severe hypercalcemia.  CXR concerning for RLL infiltrate vs mass.  Started on abx and transferred to [facility] for further evaluation and management.    Past Medical History  Tobacco abuse, HTN, GERD, BPH, HLD, G1DD  Significant Hospital Events   6/9 admit/ tx from [facility] 6/10 acute BL dvt and suspected pe   Consults:  Nephrology   Procedures:  6/9 ETT >>  Significant Diagnostic Tests:  6/9 CTH >> Moderate right parietal scalp hematoma. Bilateral ethmoid and frontal sinusitis. No acute intracranial abnormality seen.   6/10 CT chest/ abd/ pelvis >>1. Bulky adenopathy in the chest favoring malignancy. Given the narrowing of the right lower lobe and right middle lobe bronchi and airspace opacity and volume loss in the right lower lobe, the possibility of a right infrahilar tumor is raised although today's noncontrast CT imaging makes it difficult to differentiate tumor from consolidated lung. Lymphoma with extrinsic nodal narrowing of the right middle lobe and right lower lobe bronchi is a differential diagnostic consideration. 2. There several scattered lucencies in the thoracic vertebra, sternal manubrium, and right iliac bone which could reflect myeloma or small metastatic lesions. Nuclear medicine PET-CT could be helpful in further characterization. 3. 2.8 by 2.0 cm nonspecific left adrenal mass. 2.0 by 1.1 cm nonspecific right adrenal mass. 4. Small nodules in the right upper lobe and left lower lobe are nonspecific but merit surveillance. Probable bronchocele in the left lower lobe. 5. Other imaging findings of  potential clinical significance: Aortic Atherosclerosis (ICD10-I70.0). Coronary atherosclerosis. Postoperative findings in the lumbar spine. Suspected central narrowing of the thecal sac at L2-3.  6/10 cspine: 1. No acute trauma. 2. Multiple well-defined hypodense lytic lesions throughout the cervical spine. The largest lesions are at C3 and T2. Findings are highly concerning for metastatic disease to the bone. 3. Extensive mediastinal adenopathy compatible with metastatic disease. 4. Asymmetric enlargement left thyroid with a dominant nodule measuring 2.3 x 2.1 cm. This most likely represents a thyroid nodule. Metastatic disease to the thyroid is considered much less Likely.  6/10 venous doppler LE:  Right: Findings consistent with acute deep vein thrombosis involving the right popliteal vein. No cystic structure found in the popliteal fossa. Left: Findings consistent with acute deep vein thrombosis involving the left common femoral vein, left femoral vein, left proximal profunda vein, left popliteal vein, and left posterior tibial vein. No cystic structure found in the popliteal fossa.  Micro Data:  6/9 SARS coronavirus 2 >> neg 6/9 BCx 2 >> 1 of 2 methicillin resistant staph 6/9 trach asp >> gn and gp  Antimicrobials:  6/9 flagyl x1 6/9 vanc >> 6/9 cefepime >>  Interim history/subjective:  6/10: initially weaning on vent but sudden decompensation yesterday around 1600 was req increased settings to 100/14 suspected PE, le dopplers + for b/l LE dvt.   Son reports pt has had decreased appetite over past few months and has approx 50# unintentional wt loss over past month. Will change his code status to DNR, RN verified. All questions answered to best of my ability.  Objective   Blood pressure (!) 141/93, pulse Marland Kitchen)  131, temperature 99.3 F (37.4 C), resp. rate (!) 9, weight 110.6 kg, SpO2 98 %.    Vent Mode: PRVC FiO2 (%):  [100 %] 100 % Set Rate:  [18 bmp] 18 bmp Vt Set:   [600 mL] 600 mL PEEP:  [5 cmH20] 5 cmH20 Plateau Pressure:  [15 cmH20] 15 cmH20  No intake or output data in the 24 hours ending 11/04/2018 2215    Filed Weights   11/08/2018 2121  Weight: 110.6 kg   Examination: General:  Older than apparent age unkept male unresponsive on MV HEENT: ncap, perrl, mmmp, ett in place Neuro: unresponsive, not f/c, withdraw to pain and grimace to pain, spont move his feet and bite at ETT CV: rrr, no murmur PULM: even/non-labored, lungs bilaterally rhonchi GI: obese, soft, ND, BS+, foley in place Extremities: warm/dry,+ ble edema  Skin: no rashes, patient has areas of indention on his heels, buttock and right flank  Resolved Hospital Problem list    Assessment & Plan:   Acute hypoxic respiratory failure  Sepsis 2/2 RLL opacity (suspected cap, gp/gn) vs mass- with numerous areas of ? mets Tobacco abuse  Suspected PE P:  -Full MV support, titrate as able -Wean FiO2/ peep for goal sat >92-99% -VAP bundle -will need EBUS for tissue diagnosis when able -heparin gtt Leukocytosis - UA neg, questionable RLL infiltrate vs mass   P:  -Pan-culture -protcal 0.35 -Empiric vanc/ cefepime for now (blood cx with likely contaminant but with decompensation yesterday will cont) -Trend WBC (still elevated)/ fever curve (defervesced)   Acute encephalopathy  - right gaze in ER, since resolved - CT head negative for intracranial process P:  -remove c-collar with negative ct -woke up purposeful yesterday but not following commands -tox neg   AKI (prerenal) baseline sCr 1.04 at prior discharge Hypernatremia - resolved AGMA/ lactic acidosis  BPH Hypercalcemia- improving Myoglobin > 500 c/w rhabdo, CK 645 - previous PTH low, normal vit D- unclear hx of malignancy (RLL opacity will need further evaluation, hx colon polyps by colonscopy in 2017 and BPH), and was on chlorthalidone but unclear if still taking P:  -Bicarb >20 d/c bicarb at this time -cont  ivf, decrease free water -recheck lactate, ck S/p Calcitonin 4 units/kg SQ BID x 4 doses w/ pamidronate once  -nephrology appreciated  Elevated troponin 0.12 - likely just demand but rule out ACS; no acute EKG changes  P:  -Trop downtrending -recheck echo, last in 2016 with HFpEF HTN P:  Holing home meds HLD P:  Hold statin with Transaminitis   Mild Transaminitis - ?shock vs hx of occasional ETOH, prior CT in 2010 demonstrates fatty liver P:  lft improving, synthetic function intact Neg tylenol/ salicylate level GERD Coffee ground output from OGT: improved P:  PPI BID Trend H/H- stable thus far   DMT2 P:  Hold home metformin/ actos CBG q 4/ SSI sensitive   Anemia Thrombocytopenia -trend labs -no over bleeding seen and og contents improved -on ppi -did start heparin 6/10 will need to closely follow labs Acute B/L LE DVT -cont heparin gtt for now.  -suspected pe as well.   Multiple bony lesions:  -spep,upep pending -will need pet once improving and stable -psa elevated at 41.77, last normal in 2015  Best practice:  Diet: NPO Pain/Anxiety/Delirium protocol (if indicated): per pr VAP protocol (if indicated): yes DVT prophylaxis: heparin gtt GI prophylaxis: Pepcid Glucose control: SSI sensitive Mobility: BR Code Status: DNR Family Communication:  spoke with Jakaleb, Payer [phone].  Will change code status to DNR, aggressive medical care up to that point. They will go to his home and look over advanced directive as well.  Disposition: ICU  Labs   CBC: Last Labs      Recent Labs  Lab 11/07/2018 2000  WBC 24.7*  NEUTROABS 20.6*  HGB 15.6  HCT 47.5  MCV 90.6  PLT 253      Basic Metabolic Panel: Last Labs      Recent Labs  Lab 10/30/2018 2000  NA 149*  K 4.3  CL 108  CO2 21*  GLUCOSE 182*  BUN 108*  CREATININE 4.75*  CALCIUM 13.6*     GFR: Estimated Creatinine Clearance: 19.9 mL/min (A) (by C-G formula based on SCr  of 4.75 mg/dL (H)). Last Labs      Recent Labs  Lab 11/01/2018 2000  WBC 24.7*  LATICACIDVEN 4.7*      Liver Function Tests: Last Labs      Recent Labs  Lab 11/10/2018 2000  AST 56*  ALT 60*  ALKPHOS 95  BILITOT 2.2*  PROT 7.1  ALBUMIN 2.9*     Last Labs   No results for input(s): LIPASE, AMYLASE in the last 168 hours.   Last Labs   No results for input(s): AMMONIA in the last 168 hours.    ABG Labs (Brief)          Component Value Date/Time   PHART 7.330 (L) 10/26/2018 2111   PCO2ART 42.1 11/14/2018 2111   PO2ART 113 (H) 11/13/2018 2111   HCO3 21.3 10/19/2018 2111   ACIDBASEDEF 3.4 (H) 11/05/2018 2111   O2SAT 96.2 11/07/2018 2111       Coagulation Profile: Last Labs   No results for input(s): INR, PROTIME in the last 168 hours.    Cardiac Enzymes: Last Labs      Recent Labs  Lab 10/24/2018 2000  TROPONINI 0.12*      HbA1C: Last Labs         Hgb A1c MFr Bld  Date/Time Value Ref Range Status  09/18/2018 03:31 AM 5.7 (H) 4.8 - 5.6 % Final    Comment:    (NOTE) Pre diabetes:          5.7%-6.4% Diabetes:              >6.4% Glycemic control for   <7.0% adults with diabetes   12/09/2013 09:21 AM 7.1 (H) <5.7 % Final    Comment:                                                                           According to the ADA Clinical Practice Recommendations for 2011, when HbA1c is used as a screening test:     >=6.5%   Diagnostic of Diabetes Mellitus            (if abnormal result is confirmed)   5.7-6.4%   Increased risk of developing Diabetes Mellitus   References:Diagnosis and Classification of Diabetes Mellitus,Diabetes IRSW,5462,70(JJKKX 1):S62-S69 and Standards of Medical Care in         Diabetes - 2011,Diabetes Care,2011,34 (Suppl 1):S11-S61.        CBG: Last Labs    Critical care time: The patient is critically  ill with multiple organ systems failure and requires high complexity decision making for assessment  and support, frequent evaluation and titration of therapies, application of advanced monitoring technologies and extensive interpretation of multiple databases.  Critical care time 51 mins. This represents my time independent of the NPs time taking care of the pt. This is excluding procedures.    Audria Nine DO Pager: [phone] After hours pager: [phone]  [facility] 10/29/2018, 7:45 AM

## 2018-10-29 NOTE — Progress Notes (Signed)
  Echocardiogram 2D Echocardiogram has been performed.  Sean [name] 10/29/2018, 11:27 AM

## 2018-10-29 NOTE — Progress Notes (Signed)
ANTICOAGULATION CONSULT NOTE - Follow up [place] for IV heparin Indication: DVT  No Known Allergies  Patient Measurements: Height: 5\' 9"  (175.3 cm) Weight: 231 lb 0.7 oz (104.8 kg) IBW/kg (Calculated) : 70.7 Heparin Dosing Weight: ~ 95 kg  Vital Signs: Temp: 97.4 F (36.3 C) (06/11 1120) Temp Source: Oral (06/11 1120) BP: 109/85 (06/11 1120) Pulse Rate: 94 (06/11 1120)  Labs: Recent Labs    10/19/2018 2000 10/28/18 0422 10/28/18 1221 10/29/18 0222 10/29/18 0819 10/29/18 0925 10/29/18 0942 10/29/18 1107  HGB 15.6 14.6  --  12.2*  --  13.3  --   --   HCT 47.5 44.5  --  36.0*  --  39.0  --   --   PLT 253 170  --  115*  --   --   --   --   LABPROT 15.4*  --   --   --   --   --   --   --   INR 1.2  --   --   --   --   --   --   --   HEPARINUNFRC  --   --   --  0.57  --   --  0.55  --   CREATININE 4.75* 4.37* 3.94*  --   --   --   --  3.42*  CKTOTAL 645* 655* 496*  --  200  --   --  165  TROPONINI 0.12* 0.08*  --   --   --   --   --   --     Estimated Creatinine Clearance: 25.3 mL/min (A) (by C-G formula based on SCr of 3.42 mg/dL (H)).   Medical History: Past Medical History:  Diagnosis Date  . Abnormal EKG    negative stress nuclear-2005; probable inferolateral scarring in 2007- negative EKG at 10 mets   . Bilateral sciatica   . Chronic back pain   . Chronic hip pain, bilateral   . Diabetes mellitus    AODM- no insulin- fasting cbg 100-150  . 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 GERD (gastroesophageal reflux disease)   . H/O hiatal hernia   . HLD (hyperlipidemia)   . HTN (hypertension)   . Hx of dizziness   . Neuropathy   . Obesity   . Paresthesia of foot, bilateral   . Shortness of breath    exertion  . Tobacco abuse    70 pack years    Medications:  Infusions:  . sodium chloride 75 mL/hr at 10/29/18 1200  . ceFEPime (MAXIPIME) IV Stopped (10/29/18 0954)  . fentaNYL infusion INTRAVENOUS 150 mcg/hr  (10/29/18 1200)  . heparin 1,350 Units/hr (10/29/18 1200)  . propofol (DIPRIVAN) infusion 10 mcg/kg/min (10/29/18 1200)  . vancomycin      Assessment: 68 yo male with new bilateral DVT, suspected PE.  Pharmacy consulted to start anticoagulation with IV heparin.   Heparin level therapeutic x 2. Hgb 14.6>12.2, pltc 115. No bleeding reported  Goal of Therapy:  Heparin level 0.3-0.7 units/ml Monitor platelets by anticoagulation protocol: Yes   Plan:  Continue heparin gtt at 1350 units/hr. Daily heparin level and CBC. Monitor for signs/symptoms of bleeding.  Vertis Kelch, PharmD PGY1 Pharmacy Resident Phone [phone] 10/29/2018       1:05 PM

## 2018-10-30 ENCOUNTER — Inpatient Hospital Stay (HOSPITAL_COMMUNITY): Payer: Medicare Other

## 2018-10-30 DIAGNOSIS — J9601 Acute respiratory failure with hypoxia: Secondary | ICD-10-CM

## 2018-10-30 DIAGNOSIS — G9340 Encephalopathy, unspecified: Secondary | ICD-10-CM

## 2018-10-30 DIAGNOSIS — E440 Moderate protein-calorie malnutrition: Secondary | ICD-10-CM | POA: Insufficient documentation

## 2018-10-30 DIAGNOSIS — C7951 Secondary malignant neoplasm of bone: Secondary | ICD-10-CM

## 2018-10-30 LAB — CULTURE, RESPIRATORY W GRAM STAIN: Culture: NORMAL

## 2018-10-30 LAB — CBC
HCT: 34.2 % — ABNORMAL LOW (ref 39.0–52.0)
Hemoglobin: 11.2 g/dL — ABNORMAL LOW (ref 13.0–17.0)
MCH: 29.9 pg (ref 26.0–34.0)
MCHC: 32.7 g/dL (ref 30.0–36.0)
MCV: 91.2 fL (ref 80.0–100.0)
Platelets: 127 10*3/uL — ABNORMAL LOW (ref 150–400)
RBC: 3.75 MIL/uL — ABNORMAL LOW (ref 4.22–5.81)
RDW: 15.6 % — ABNORMAL HIGH (ref 11.5–15.5)
WBC: 19.4 10*3/uL — ABNORMAL HIGH (ref 4.0–10.5)
nRBC: 0.8 % — ABNORMAL HIGH (ref 0.0–0.2)

## 2018-10-30 LAB — GLUCOSE, CAPILLARY
Glucose-Capillary: 144 mg/dL — ABNORMAL HIGH (ref 70–99)
Glucose-Capillary: 147 mg/dL — ABNORMAL HIGH (ref 70–99)
Glucose-Capillary: 149 mg/dL — ABNORMAL HIGH (ref 70–99)
Glucose-Capillary: 156 mg/dL — ABNORMAL HIGH (ref 70–99)
Glucose-Capillary: 159 mg/dL — ABNORMAL HIGH (ref 70–99)
Glucose-Capillary: 173 mg/dL — ABNORMAL HIGH (ref 70–99)

## 2018-10-30 LAB — COMPREHENSIVE METABOLIC PANEL
ALT: 29 U/L (ref 0–44)
AST: 20 U/L (ref 15–41)
Albumin: 1.6 g/dL — ABNORMAL LOW (ref 3.5–5.0)
Alkaline Phosphatase: 68 U/L (ref 38–126)
Anion gap: 14 (ref 5–15)
BUN: 100 mg/dL — ABNORMAL HIGH (ref 8–23)
CO2: 22 mmol/L (ref 22–32)
Calcium: 9.2 mg/dL (ref 8.9–10.3)
Chloride: 108 mmol/L (ref 98–111)
Creatinine, Ser: 2.69 mg/dL — ABNORMAL HIGH (ref 0.61–1.24)
GFR calc Af Amer: 27 mL/min — ABNORMAL LOW (ref >60)
GFR calc non Af Amer: 24 mL/min — ABNORMAL LOW (ref >60)
Glucose, Bld: 172 mg/dL — ABNORMAL HIGH (ref 70–99)
Potassium: 4.2 mmol/L (ref 3.5–5.1)
Sodium: 144 mmol/L (ref 135–145)
Total Bilirubin: 1.6 mg/dL — ABNORMAL HIGH (ref 0.3–1.2)
Total Protein: 4.4 g/dL — ABNORMAL LOW (ref 6.5–8.1)

## 2018-10-30 LAB — MAGNESIUM: Magnesium: 2.2 mg/dL (ref 1.7–2.4)

## 2018-10-30 LAB — HEPARIN LEVEL (UNFRACTIONATED): Heparin Unfractionated: 0.54 IU/mL (ref 0.30–0.70)

## 2018-10-30 LAB — PARATHYROID HORMONE, INTACT (NO CA): PTH: 13 pg/mL — ABNORMAL LOW (ref 15–65)

## 2018-10-30 LAB — PHOSPHORUS: Phosphorus: 2.3 mg/dL — ABNORMAL LOW (ref 2.5–4.6)

## 2018-10-30 MED ORDER — POTASSIUM PHOSPHATE MONOBASIC 500 MG PO TABS
500.0000 mg | ORAL_TABLET | Freq: Once | ORAL | Status: DC
  Filled 2018-10-30: qty 1

## 2018-10-30 MED ORDER — PANTOPRAZOLE SODIUM 40 MG PO PACK
40.0000 mg | PACK | Freq: Every day | ORAL | Status: DC
  Administered 2018-10-30 – 2018-10-31 (×2): 40 mg
  Filled 2018-10-30 (×2): qty 20

## 2018-10-30 MED ORDER — K PHOS MONO-SOD PHOS DI & MONO 155-852-130 MG PO TABS
500.0000 mg | ORAL_TABLET | Freq: Once | ORAL | Status: AC
  Administered 2018-10-30: 500 mg via ORAL
  Filled 2018-10-30: qty 2

## 2018-10-30 MED ORDER — SODIUM CHLORIDE 0.9 % IV SOLN
2.0000 g | Freq: Two times a day (BID) | INTRAVENOUS | Status: DC
  Administered 2018-10-30 – 2018-10-31 (×3): 2 g via INTRAVENOUS
  Filled 2018-10-30 (×2): qty 2

## 2018-10-30 NOTE — Progress Notes (Signed)
Updated patient's son via telephone. Offered video call. Family would like to video call later this evening.

## 2018-10-30 NOTE — Progress Notes (Signed)
Discussed w/ CCM re: vent plan for today.  Plan is to try to wean peep to 10 slowly t/o day.  Peep reduced to 15, sat 97% currently.

## 2018-10-30 NOTE — Progress Notes (Signed)
NAME:  Sean [name], MRN:  [id number], DOB:  1950/10/01, LOS: 0 ADMISSION DATE:  10/26/2018, CONSULTATION DATE:  11/16/2018 REFERRING MD:  Dr. Laverta Baltimore [facility], CHIEF COMPLAINT:  AMS/ hypoxia  Brief History   68 year old male found unresponsive and hypoxic at home .  Required intubation for hypoxia and GCS of 8.  CTH negative.  Found to have AKI with severe hypercalcemia.  CXR concerning for RLL infiltrate vs mass.  Started on abx and transferred to [facility] for further evaluation and management.    Past Medical History  Tobacco abuse, HTN, GERD, BPH, HLD, G1DD  Significant Hospital Events   6/9 admit/ tx from [facility] 6/9 : initially weaning on vent but sudden decompensation yesterday around 1600 was req increased settings to 100%/ PEEP14 suspected PE, le dopplers + for b/l LE dvt.    Consults:  Nephrology   Procedures:  6/9 ETT >>  Significant Diagnostic Tests:  6/9 CTH >> Moderate right parietal scalp hematoma. Bilateral ethmoid and frontal sinusitis. No acute intracranial abnormality seen.   6/10 CT chest/ abd/ pelvis >>1. Bulky adenopathy in the chest favoring malignancy. Given the narrowing of the right lower lobe and right middle lobe bronchi and airspace opacity and volume loss in the right lower lobe, the possibility of a right infrahilar tumor is raised although today's noncontrast CT imaging makes it difficult to differentiate tumor from consolidated lung. Lymphoma with extrinsic nodal narrowing of the right middle lobe and right lower lobe bronchi is a differential diagnostic consideration. 2. There several scattered lucencies in the thoracic vertebra, sternal manubrium, and right iliac bone which could reflect myeloma or small metastatic lesions. Nuclear medicine PET-CT could be helpful in further characterization. 3. 2.8 by 2.0 cm nonspecific left adrenal mass. 2.0 by 1.1 cm nonspecific right adrenal mass. 4. Small nodules in the right upper lobe and left lower lobe  are nonspecific but merit surveillance. Probable bronchocele in the left lower lobe. 5. Other imaging findings of potential clinical significance: Aortic Atherosclerosis (ICD10-I70.0). Coronary atherosclerosis. Postoperative findings in the lumbar spine. Suspected central narrowing of the thecal sac at L2-3.  6/10 cspine: 1. No acute trauma. 2. Multiple well-defined hypodense lytic lesions throughout the cervical spine. The largest lesions are at C3 and T2. Findings are highly concerning for metastatic disease to the bone. 3. Extensive mediastinal adenopathy compatible with metastatic disease. 4. Asymmetric enlargement left thyroid with a dominant nodule measuring 2.3 x 2.1 cm. This most likely represents a thyroid nodule. Metastatic disease to the thyroid is considered much less Likely.  6/10 venous doppler LE:  Right: Findings consistent with acute deep vein thrombosis involving the right popliteal vein. No cystic structure found in the popliteal fossa. Left: Findings consistent with acute deep vein thrombosis involving the left common femoral vein, left femoral vein, left proximal profunda vein, left popliteal vein, and left posterior tibial vein. No cystic structure found in the popliteal fossa.  Micro Data:  6/9 SARS coronavirus 2 >> neg 6/9 BCx 2 >> 1/2 coag neg staph 6/9 trach asp >> nml flora  Antimicrobials:  6/9 flagyl x1 6/9 vanc >>6/12 6/9 cefepime >>  Interim history/subjective:   He is critically ill, intubated Remains on 90%/PEEP of 16  Objective    Vitals:   10/30/18 1505 10/30/18 1515  BP:  100/70  Pulse: 84 84  Resp: 18 11  Temp:  (!) 97.5 F (36.4 C)  SpO2: 95% 97%       Filed Weights   11/08/2018 2121  Weight: 110.6 kg   Examination: General:  Older than apparent age, acutely ill, intubated and sedated HEENT: Mild pallor, no icterus Ett in place Neuro:  Induced coma,  RA SS -31 propofol/fentanyl CV: rrr, no murmur PULM:  even/non-labored, lungs bilaterally rhonchi GI: obese, soft, ND, BS+, foley in place Extremities: warm/dry,+ ble edema  Skin: no rashes  Chest x-ray 6/12 personally reviewed which shows ET tube in position and right perihilar infiltrate  Resolved Hospital Problem list     Elevated troponin 0.12 - likely just demand ; no acute EKG changes , echo >> nml lV fn   Mild Transaminitis - ?shock vs hx of occasional ETOH, prior CT in 2010 demonstrates fatty live  Assessment & Plan:   Acute hypoxic respiratory failure  Tobacco abuse  Suspected PE P:  -Full MV support -Wean FiO2/ peep for goal sat >92-99% -doubt he is PEEP responsive, will try to lower PEEP gradually to 10 and then work on FiO2 -VAP bundle - heparin gtt -Bronchoscopy/biopsy deferred for now given severe hypoxia  Postobstructive pneumonia Leukocytosis - UA neg, questionable RLL infiltrate vs mass   Coag negative staph likely contaminant P:  -ct  cefepime for now  -Discontinue vancomycin   Acute encephalopathy  - right gaze in ER, since resolved - CT head negative for intracranial process, -removed c-collar with negative ct P:  -Continue propofol and fentanyl with goal RA SS -3 while on high PEEP   AKI (prerenal) baseline sCr 1.04 at prior discharge Hypernatremia - resolved AGMA/ lactic acidosis  BPH Hypercalcemia- improving Myoglobin > 500 c/w rhabdo, CK 645 - previous PTH low, normal vit D- unclear hx of malignancy (RLL opacity will need further evaluation, hx colon polyps by colonscopy in 2017 and BPH), and was on chlorthalidone but unclear if still taking S/p Calcitonin 4 units/kg SQ BID x 4 doses w/ pamidronate once  P:  -cont ivf -nephrology appreciated   HTN P:  Holding home meds  HLD P:  Hold statin with Transaminitis   GERD Coffee ground output from OGT: improved P:  PPI BID Trend H/H- stable thus far   DMT2 P:  Hold home metformin/ actos CBG q 4/ SSI sensitive   Acute B/L LE DVT Anemia Thrombocytopenia  -no overt bleeding seen  -on ppi -cont heparin gtt for now.    Multiple bony lesions:  -spep,upep pending -will need pet once improving and stable -psa elevated at 41.77, last normal in 2015  Best practice:  Diet: NPO Pain/Anxiety/Delirium protocol (if indicated): per pr VAP protocol (if indicated): yes DVT prophylaxis: heparin gtt GI prophylaxis: Pepcid Glucose control: SSI sensitive Mobility: BR Code Status: DNR Family Communication:  6/12 Sean [name], Sean [name] [phone].  Disposition: ICU  Summary -he likely has metastatic cancer with lung primary, metastatic prostate is possible but tumor burden in the lung seems to be high.  His high oxygen requirements are quite out of proportion to right lower lobe postobstructive pneumonia therefore PE seems very likely. Goals of care discussion with son today-really doubt that he will liberate from the ventilator even if we pursue aggressive approach including radiation therapy for cancer.  Son emphasized that quality of life would be important to him and I doubt that he would survive this critical illness with significant quality of life.  Son will travel from [place] to visit him tomorrow and will likely proceed with withdrawal of life support  The patient is critically ill with multiple organ systems failure and requires high complexity decision making for  assessment and support, frequent evaluation and titration of therapies, application of advanced monitoring technologies and extensive interpretation of multiple databases. Critical Care Time devoted to patient care services described in this note independent of APP/resident  time is 35 minutes.    Kara Mead MD. Shade Flood. [facility] Pager [phone] If no response call [phone]    10/30/2018, 4:47 PM

## 2018-10-30 NOTE — Progress Notes (Deleted)
Called Dialysis to coordinate bedside HD today with patient's MRI and patients ventilator mgmt with RT. HD "not sure when we will get to the patient today." Will follow up.

## 2018-10-30 NOTE — Progress Notes (Signed)
[facility] Progress Note    Assessment/ Plan:   68 y.o.maleHTN DM HLD smoker (>100PY) recent admission for AKI and UTI brought in by EMS after being found unresponse in the bathroom after law enforcement performed a welfare check. Intubated in the [place] found to be hypernatremic, hypercalcemic and in renal failure.He was also found to be hypercalcemic during an admission 09/18/2018 when he presented with generalized weakness; Ca was 14.5 at that time w/ a creatinine of 2.09 but came down to the 1-1.3 range @ time of d/c. COVID neg.  1. Acute kidney injury on CKD w/ CK 655and e/o prerenal azotemia with a FeNA <1% responding to fluids. Urine microscopy is not active. CKD component from HTN and DM with a BL creatinine in the 1-1.3 range. Acute component appears to be from prerenal azotemia. Hypercalcemia leads to decreased activity of aquaporin2 channels which partially explains his initial presentation of AKI  - Appears to have lytic lesions from mets in the spin, manubrium and left femur with an elevated PSA + hilar masses with h/o >100PY of smoking.  - PTH 13 which suggests appropriate response of parathyroid gland to elevated calcium from another source (likely malignancy with bone infiltration)  Signing off at this time; please reconsult as needed.  2. Hypercalcemia - already trending down with hydration from 13.6 to 12 as of 6/10. - SPEP, UPEP, PTH, PTHrP P - Already on calcitonin BID + pamidronate x1.   3. Hypernatremia- resolving and trending in right direction 4. Prostate Ca - PSA is 41.8 5. DVT - acute deep vein thrombosis involving right popliteal vein, left common femoral vein, left femoral vein, left proximal profunda vein, left popliteal vein, and left posterior tibial vein.   Subjective:   Intubated UOP better   Objective:   BP 95/76   Pulse 93   Temp (!) 97.2 F (36.2 C) (Axillary)   Resp 18   Ht '5\' 9"'  (1.753 m)   Wt 110.5 kg   SpO2 99%    BMI 35.97 kg/m   Intake/Output Summary (Last 24 hours) at 10/30/2018 0754 Last data filed at 10/30/2018 0746 Gross per 24 hour  Intake 5432 ml  Output 853 ml  Net 4579 ml   Weight change: 5.7 kg  Physical Exam: GEN: Intubated HEENT: Conjunctival pallor LUNGS: CTA B/L intubated CV: RRR, No M/R/G ABD: SNDNT +BS  EXT: Minimal lower extremity edema GU: Foley to gravity  Imaging: Portable Chest Xray  Result Date: 10/29/2018 CLINICAL DATA:  Status post intubation EXAM: PORTABLE CHEST 1 VIEW COMPARISON:  10/27/2028 FINDINGS: Endotracheal tube and gastric catheter are again noted and stable. Right hilar fullness is again seen and stable. No bony abnormality is seen. IMPRESSION: Tubes and lines as described. Right hilar fullness similar to that seen on prior exams. Electronically Signed   By: Inez Catalina M.D.   On: 10/29/2018 08:35   Dg Chest Portable 1 View  Result Date: 10/28/2018 CLINICAL DATA:  Status post intubation EXAM: PORTABLE CHEST 1 VIEW COMPARISON:  10/28/2018 FINDINGS: Cardiac shadows within normal limits. Endotracheal tube and gastric catheter are seen. Fullness in the right hilar region and infrahilar region is noted consistent with that seen on recent CT examination. Lungs are otherwise clear. The degree of aeration in the right lower lobe has improved when compared with the prior exam. IMPRESSION: Right hilar fullness similar to that seen on recent CT examination. Electronically Signed   By: Inez Catalina M.D.   On: 10/28/2018 15:27   Vas  [place] Lower Extremity Venous (dvt)  Result Date: 10/28/2018  Lower Venous Study Indications: Stroke, and underlying malignancy w/ hypoixa.  Performing Technologist: Abram Sander RVS  Examination Guidelines: A complete evaluation includes B-mode imaging, spectral Doppler, color Doppler, and power Doppler as needed of all accessible portions of each vessel. Bilateral testing is considered an integral part of a complete examination. Limited  examinations for reoccurring indications may be performed as noted.  +---------+---------------+---------+-----------+----------+--------------+ RIGHT    CompressibilityPhasicitySpontaneityPropertiesSummary        +---------+---------------+---------+-----------+----------+--------------+ CFV      Full           Yes      Yes                                 +---------+---------------+---------+-----------+----------+--------------+ SFJ      Full                                                        +---------+---------------+---------+-----------+----------+--------------+ FV Prox  Full                                                        +---------+---------------+---------+-----------+----------+--------------+ FV Mid   Full                                                        +---------+---------------+---------+-----------+----------+--------------+ FV DistalFull                                                        +---------+---------------+---------+-----------+----------+--------------+ PFV      Full                                                        +---------+---------------+---------+-----------+----------+--------------+ POP      Partial        Yes      Yes                  Acute          +---------+---------------+---------+-----------+----------+--------------+ PTV      Full                                                        +---------+---------------+---------+-----------+----------+--------------+ PERO  Not visualized +---------+---------------+---------+-----------+----------+--------------+   +---------+---------------+---------+-----------+----------+--------------+ LEFT     CompressibilityPhasicitySpontaneityPropertiesSummary        +---------+---------------+---------+-----------+----------+--------------+ CFV      None           No       No                    Acute          +---------+---------------+---------+-----------+----------+--------------+ SFJ      Full                                                        +---------+---------------+---------+-----------+----------+--------------+ FV Prox  None                                         Acute          +---------+---------------+---------+-----------+----------+--------------+ FV Mid   None                                         Acute          +---------+---------------+---------+-----------+----------+--------------+ FV DistalNone                                         Acute          +---------+---------------+---------+-----------+----------+--------------+ PFV      None                                         Acute          +---------+---------------+---------+-----------+----------+--------------+ POP      None           No       No                   Acute          +---------+---------------+---------+-----------+----------+--------------+ PTV      None                                         Acute          +---------+---------------+---------+-----------+----------+--------------+ PERO                                                  Not visualized +---------+---------------+---------+-----------+----------+--------------+     Summary: Right: Findings consistent with acute deep vein thrombosis involving the right popliteal vein. No cystic structure found in the popliteal fossa. Left: Findings consistent with acute deep vein thrombosis involving the left common femoral vein, left femoral vein, left proximal profunda vein, left popliteal vein, and left posterior tibial vein. No cystic structure found in the popliteal fossa.  *See table(s) above for measurements and observations. Electronically signed by Sherren Mocha  Early MD on 10/28/2018 at 4:58:06 PM.    Final     Labs: BMET Recent Labs  Lab 10/26/2018 2000 10/23/2018 2237 10/28/18 0422 10/28/18 1221  10/29/18 0925 10/29/18 1107 10/29/18 1512 10/30/18 0526  NA 149*  --  148* 145 142 145  --  144  K 4.3  --  4.0 4.5 3.4* 3.2*  --  4.2  CL 108  --  111 107  --  104  --  108  CO2 21*  --  19* 22  --  28  --  22  GLUCOSE 182*  --  240* 174*  --  146*  --  172*  BUN 108*  --  105* 104*  --  100*  --  100*  CREATININE 4.75*  --  4.37* 3.94*  --  3.42*  --  2.69*  CALCIUM 13.6*  --  12.0* 10.8*  --  9.6  --  9.2  PHOS  --  4.9*  --   --   --   --  2.7 2.3*   CBC Recent Labs  Lab 11/07/2018 2000 10/28/18 0422 10/29/18 0222 10/29/18 0925 10/30/18 0526  WBC 24.7* 24.6* 22.5*  --  19.4*  NEUTROABS 20.6*  --   --   --   --   HGB 15.6 14.6 12.2* 13.3 11.2*  HCT 47.5 44.5 36.0* 39.0 34.2*  MCV 90.6 90.4 88.0  --  91.2  PLT 253 170 115*  --  127*    Medications:    . chlorhexidine gluconate (MEDLINE KIT)  15 mL Mouth Rinse BID  . Chlorhexidine Gluconate Cloth  6 each Topical Q0600  . feeding supplement (PRO-STAT SUGAR FREE 64)  60 mL Per Tube TID  . feeding supplement (VITAL AF 1.2 CAL)  1,000 mL Per Tube Q24H  . free water  60 mL Per Tube Q4H  . insulin aspart  0-9 Units Subcutaneous Q4H  . mouth rinse  15 mL Mouth Rinse 10 times per day  . pantoprazole (PROTONIX) IV  40 mg Intravenous Q12H  . potassium chloride  40 mEq Oral BID      Otelia Santee, MD 10/30/2018, 7:54 AM

## 2018-10-30 NOTE — Progress Notes (Signed)
ANTICOAGULATION & ANTIBIOTIC CONSULT NOTE  Pharmacy Consult:  Heparin + Cefepime Indication: DVT + sepsis  No Known Allergies  Patient Measurements: Height: 5\' 9"  (175.3 cm) Weight: 243 lb 9.7 oz (110.5 kg) IBW/kg (Calculated) : 70.7 Heparin Dosing Weight: ~ 95 kg  Vital Signs: Temp: 97.6 F (36.4 C) (06/12 0700) Temp Source: Axillary (06/12 0700) BP: 94/71 (06/12 0819) Pulse Rate: 93 (06/12 0819)  Labs: Recent Labs    10/25/2018 2000 10/28/18 0422 10/28/18 1221 10/29/18 0222 10/29/18 0819 10/29/18 0925 10/29/18 0942 10/29/18 1107 10/30/18 0526  HGB 15.6 14.6  --  12.2*  --  13.3  --   --  11.2*  HCT 47.5 44.5  --  36.0*  --  39.0  --   --  34.2*  PLT 253 170  --  115*  --   --   --   --  127*  LABPROT 15.4*  --   --   --   --   --   --   --   --   INR 1.2  --   --   --   --   --   --   --   --   HEPARINUNFRC  --   --   --  0.57  --   --  0.55  --  0.54  CREATININE 4.75* 4.37* 3.94*  --   --   --   --  3.42* 2.69*  CKTOTAL 645* 655* 496*  --  200  --   --  165  --   TROPONINI 0.12* 0.08*  --   --   --   --   --   --   --     Estimated Creatinine Clearance: 33.1 mL/min (A) (by C-G formula based on SCr of 2.69 mg/dL (H)).   Assessment: 68 yo male with new bilateral DVT, suspected PE to continue on IV heparin.  Heparin level is therapeutic and stable.  No bleeding reported.   Pharmacy also dosing cefepime for sepsis.  Renal function is improving.  Hypothermic, WBC 19.4, LA 2.3.  Flagyl x1 6/9 Vanc 6/9 >>6/12 Cefepime 6/9>>  6/9BC x2:GPC and GPR anaerobic bottle only (1/4) 6/9 BCID: staph species - mecA detected (possible contaminant)  6/9 COVID: negative 6/9 MRSA: negative 6/10 TA: reincubated  Goal of Therapy:  Heparin level 0.3-0.7 units/ml Monitor platelets by anticoagulation protocol: Yes  Resolution of infection   Plan:  Continue heparin gtt at 1350 units/hr Daily heparin level and CBC  Change cefepime to 2g IV Q12H Monitor renal fxn,  clinical progress  Ismahan Lippman D. Mina Marble, PharmD, BCPS, [place] 10/30/2018, 9:41 AM

## 2018-10-31 DIAGNOSIS — U071 COVID-19: Secondary | ICD-10-CM

## 2018-10-31 DIAGNOSIS — Z7189 Other specified counseling: Secondary | ICD-10-CM

## 2018-10-31 DIAGNOSIS — Z515 Encounter for palliative care: Secondary | ICD-10-CM

## 2018-10-31 DIAGNOSIS — J80 Acute respiratory distress syndrome: Secondary | ICD-10-CM

## 2018-10-31 LAB — CBC
HCT: 34.2 % — ABNORMAL LOW (ref 39.0–52.0)
Hemoglobin: 10.9 g/dL — ABNORMAL LOW (ref 13.0–17.0)
MCH: 30.4 pg (ref 26.0–34.0)
MCHC: 31.9 g/dL (ref 30.0–36.0)
MCV: 95.3 fL (ref 80.0–100.0)
Platelets: 151 10*3/uL (ref 150–400)
RBC: 3.59 MIL/uL — ABNORMAL LOW (ref 4.22–5.81)
RDW: 16.4 % — ABNORMAL HIGH (ref 11.5–15.5)
WBC: 19.4 10*3/uL — ABNORMAL HIGH (ref 4.0–10.5)
nRBC: 0.9 % — ABNORMAL HIGH (ref 0.0–0.2)

## 2018-10-31 LAB — COMPREHENSIVE METABOLIC PANEL
ALT: 29 U/L (ref 0–44)
AST: 21 U/L (ref 15–41)
Albumin: 1.6 g/dL — ABNORMAL LOW (ref 3.5–5.0)
Alkaline Phosphatase: 91 U/L (ref 38–126)
Anion gap: 14 (ref 5–15)
BUN: 92 mg/dL — ABNORMAL HIGH (ref 8–23)
CO2: 19 mmol/L — ABNORMAL LOW (ref 22–32)
Calcium: 8.9 mg/dL (ref 8.9–10.3)
Chloride: 112 mmol/L — ABNORMAL HIGH (ref 98–111)
Creatinine, Ser: 2.03 mg/dL — ABNORMAL HIGH (ref 0.61–1.24)
GFR calc Af Amer: 38 mL/min — ABNORMAL LOW (ref >60)
GFR calc non Af Amer: 33 mL/min — ABNORMAL LOW (ref >60)
Glucose, Bld: 174 mg/dL — ABNORMAL HIGH (ref 70–99)
Potassium: 4.3 mmol/L (ref 3.5–5.1)
Sodium: 145 mmol/L (ref 135–145)
Total Bilirubin: 1.7 mg/dL — ABNORMAL HIGH (ref 0.3–1.2)
Total Protein: 4.6 g/dL — ABNORMAL LOW (ref 6.5–8.1)

## 2018-10-31 LAB — GLUCOSE, CAPILLARY
Glucose-Capillary: 176 mg/dL — ABNORMAL HIGH (ref 70–99)
Glucose-Capillary: 164 mg/dL — ABNORMAL HIGH (ref 70–99)

## 2018-10-31 LAB — MAGNESIUM: Magnesium: 1.8 mg/dL (ref 1.7–2.4)

## 2018-10-31 LAB — PHOSPHORUS: Phosphorus: 2.2 mg/dL — ABNORMAL LOW (ref 2.5–4.6)

## 2018-10-31 LAB — HEPARIN LEVEL (UNFRACTIONATED): Heparin Unfractionated: <0.1 IU/mL — ABNORMAL LOW (ref 0.30–0.70)

## 2018-10-31 MED ORDER — GLYCOPYRROLATE 0.2 MG/ML IJ SOLN: 0.2000 mg | INTRAMUSCULAR | Status: DC | PRN

## 2018-10-31 MED ORDER — GLYCOPYRROLATE 1 MG PO TABS: 1.0000 mg | ORAL_TABLET | ORAL | Status: DC | PRN

## 2018-10-31 MED ORDER — ACETAMINOPHEN 650 MG RE SUPP: 650.0000 mg | Freq: Four times a day (QID) | RECTAL | Status: DC | PRN

## 2018-10-31 MED ORDER — ACETAMINOPHEN 325 MG PO TABS: 650.0000 mg | ORAL_TABLET | Freq: Four times a day (QID) | ORAL | Status: DC | PRN

## 2018-10-31 MED ORDER — DIPHENHYDRAMINE HCL 50 MG/ML IJ SOLN: 25.0000 mg | INTRAMUSCULAR | Status: DC | PRN

## 2018-10-31 MED ORDER — DEXTROSE 5 % IV SOLN
INTRAVENOUS | Status: DC
  Administered 2018-10-31: 13:00:00 via INTRAVENOUS

## 2018-10-31 MED ORDER — POLYVINYL ALCOHOL 1.4 % OP SOLN
1.0000 [drp] | Freq: Four times a day (QID) | OPHTHALMIC | Status: DC | PRN
  Filled 2018-10-31: qty 15

## 2018-10-31 MED ORDER — MORPHINE 100MG IN NS 100ML (1MG/ML) PREMIX INFUSION
0.0000 mg/h | INTRAVENOUS | Status: DC
  Administered 2018-10-31: 10 mg/h via INTRAVENOUS
  Administered 2018-10-31: 20 mg/h via INTRAVENOUS
  Filled 2018-10-31 (×2): qty 100

## 2018-10-31 MED ORDER — MORPHINE BOLUS VIA INFUSION
5.0000 mg | INTRAVENOUS | Status: DC | PRN
  Filled 2018-10-31: qty 5

## 2018-10-31 MED ORDER — MORPHINE SULFATE (PF) 2 MG/ML IV SOLN: 2.0000 mg | INTRAVENOUS | Status: DC | PRN

## 2018-11-01 LAB — CULTURE, BLOOD (ROUTINE X 2): Special Requests: ADEQUATE

## 2018-11-03 LAB — PTH-RELATED PEPTIDE: PTH-related peptide: 3.4 pmol/L

## 2018-11-06 ENCOUNTER — Telehealth: Payer: Self-pay | Admitting: *Deleted

## 2018-11-06 NOTE — Telephone Encounter (Signed)
Received Signed original D/C -funeral home notified of pick up.D/C Signed by [facility].

## 2018-11-18 NOTE — Progress Notes (Signed)
Brother, 2 sons and a daughter in law arrived from [place].  Many questions about condition.  I was very clear that I can not tell them that he has cancer but with history of missed to follow up colonic polyps 3 years ago, smoking and post obstructive pneumonia and bone lesions this is likely colon and lung cancer but can never tell without biopsy.  I agree with dr Elsworth Soho, the patient is unlikely to liberate from the ventilator and remains on 100% and PEEP of 10.  After a long discussion, they were clear the patient is a very private person and unless we can guarantee that he can go back to his normal life he would not want this level of care.  After a long discussion, decision was made to proceed with comfort care.  Will start morphine and extubate when family is ready.  The patient is critically ill with multiple organ systems failure and requires high complexity decision making for assessment and support, frequent evaluation and titration of therapies, application of advanced monitoring technologies and extensive interpretation of multiple databases.   Critical Care Time devoted to patient care services described in this note is  50  Minutes. This time reflects time of care of this signee Dr Jennet Maduro. This critical care time does not reflect procedure time, or teaching time or supervisory time of PA/NP/Med student/Med Resident etc but could involve care discussion time.  Rush Farmer, M.D. [facility] Pulmonary/Critical Care Medicine. Pager: [phone]. After hours pager: [phone].

## 2018-11-18 NOTE — Progress Notes (Addendum)
Pt received as comfort care, expired at 2106 with absence of heart tones verified by writer & Lilian Kapur, RN.  Sean [name], pt's son,  contacted & notified of pt's expiration per previous request & stated he is coming to the bedside to collect belongings. Wasted 50cc of Morphine in sink with Raynald Kemp, RN.

## 2018-11-18 NOTE — Procedures (Signed)
Extubation Procedure Note  Patient Details:   Name: Sean [name] DOB: Dec 06, 1950 MRN: [id number]   Airway Documentation:    Vent end date: 11-19-18 Vent end time: 1320   Evaluation  O2 sats: stable throughout Complications: No apparent complications Patient did tolerate procedure well. Bilateral Breath Sounds: Diminished, Other (Comment)(coarse)   No pt extubated per withdrawal of life support w/ family at bedside.  Procedure explained prior & all questions answered prior to extubating.    Lenna Sciara 11/19/2018, 1:26 PM

## 2018-11-18 NOTE — Progress Notes (Signed)
Chaplain responded to page from unit at 12:33 PM. Plan in place to extubate. Morphine just started. Two sons, and daughter-in-law present. Provided ministry of presence to family.  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 Jakes on TV.  He told family "The Lord and I have an understanding."  Both sons highlighted father's independent private nature.  One son asked for private time with father.  Chaplain offered to return if/when family desired prayer or support.   Will follow-up on request. Tamsen Snider Pager [phone]

## 2018-11-18 NOTE — Progress Notes (Signed)
Labs were put on hold till later on this morning due to patient's code status. Pharmacy made aware about heparin lab.

## 2018-11-18 NOTE — Progress Notes (Signed)
NAME:  Sean [name], MRN:  [id number], DOB:  05-Aug-1950, LOS: 0 ADMISSION DATE:  11/11/2018, CONSULTATION DATE:  11/15/2018 REFERRING MD:  Dr. Laverta [facility], CHIEF COMPLAINT:  AMS/ hypoxia  Brief History   68 year old male found unresponsive and hypoxic at home .  Required intubation for hypoxia and GCS of 8.  CTH negative.  Found to have AKI with severe hypercalcemia.  CXR concerning for RLL infiltrate vs mass.  Started on abx and transferred to [facility] for further evaluation and management.   Past Medical History  Tobacco abuse, HTN, GERD, BPH, HLD, G1DD  Significant Hospital Events   6/9 admit/ tx from [facility] 6/9 : initially weaning on vent but sudden decompensation yesterday around 1600 was req increased settings to 100%/ PEEP14 suspected PE, le dopplers + for b/l LE dvt.   Consults:  Nephrology   Procedures:  6/9 ETT >>  Significant Diagnostic Tests:  6/9 CTH >> Moderate right parietal scalp hematoma. Bilateral ethmoid and frontal sinusitis. No acute intracranial abnormality seen.  6/10 CT chest/ abd/ pelvis >>1. Bulky adenopathy in the chest favoring malignancy. Given the narrowing of the right lower lobe and right middle lobe bronchi and airspace opacity and volume loss in the right lower lobe, the possibility of a right infrahilar tumor is raised although today's noncontrast CT imaging makes it difficult to differentiate tumor from consolidated lung. Lymphoma with extrinsic nodal narrowing of the right middle lobe and right lower lobe bronchi is a differential diagnostic consideration. 2. There several scattered lucencies in the thoracic vertebra, sternal manubrium, and right iliac bone which could reflect myeloma or small metastatic lesions. Nuclear medicine PET-CT could be helpful in further characterization. 3. 2.8 by 2.0 cm nonspecific left adrenal mass. 2.0 by 1.1 cm nonspecific right adrenal mass. 4. Small nodules in the right upper lobe and left lower lobe are  nonspecific but merit surveillance. Probable bronchocele in the left lower lobe. 5. Other imaging findings of potential clinical significance: Aortic Atherosclerosis (ICD10-I70.0). Coronary atherosclerosis. Postoperative findings in the lumbar spine. Suspected central narrowing of the thecal sac at L2-3.  6/10 cspine: 1. No acute trauma. 2. Multiple well-defined hypodense lytic lesions throughout the cervical spine. The largest lesions are at C3 and T2. Findings are highly concerning for metastatic disease to the bone. 3. Extensive mediastinal adenopathy compatible with metastatic disease. 4. Asymmetric enlargement left thyroid with a dominant nodule measuring 2.3 x 2.1 cm. This most likely represents a thyroid nodule. Metastatic disease to the thyroid is considered much less Likely.  6/10 venous doppler LE:  Right: Findings consistent with acute deep vein thrombosis involving the right popliteal vein. No cystic structure found in the popliteal fossa. Left: Findings consistent with acute deep vein thrombosis involving the left common femoral vein, left femoral vein, left proximal profunda vein, left popliteal vein, and left posterior tibial vein. No cystic structure found in the popliteal fossa.  Micro Data:  6/9 SARS coronavirus 2 >> neg 6/9 BCx 2 >> 1/2 coag neg staph 6/9 trach asp >> nml flora  Antimicrobials:  6/9 flagyl x1 6/9 vanc >>6/12 6/9 cefepime >>  Interim history/subjective:   Remains unresponsive overnight PEEP and FiO2 remain too high  Objective    Vitals:   11-12-18 0756 11/12/2018 0800  BP:  104/73  Pulse:  94  Resp:  16  Temp: (!) 97.4 F (36.3 C)   SpO2:  93%       Filed Weights   10/24/2018 2121  Weight: 110.6 kg  Examination: General:  Acute on chronically ill appearing, NAD HEENT: [place]/AT, PERRL, EOM-I and MMM Neuro:  Comatose on fentanyl and propofol CV: RRR, Nl S1/S2 and -M/R/G PULM: CTA bilaterally GI: Soft, NT, ND and +BS  Extremities: 1+ edema and -tenderness Skin: no rashes  I reviewed CXR myself, ETT is in a good position with   Resolved Hospital Problem list     Elevated troponin 0.12 - likely just demand ; no acute EKG changes , echo >> nml lV fn   Mild Transaminitis - ?shock vs hx of occasional ETOH, prior CT in 2010 demonstrates fatty live  Assessment & Plan:   Acute hypoxic respiratory failure  Tobacco abuse  Suspected PE P:  - Maintain on full vent support - No weaning - When family arrives will terminally extubate today - VAP bundle - Heparin gtt - Bronchoscopy/biopsy deferred for now given severe hypoxia and proceed with comfort  Postobstructive pneumonia Leukocytosis - UA neg, questionable RLL infiltrate vs mass   Coag negative staph likely contaminant P:  - D/C abx - Proceed with comfort - Discontinue vancomycin  Acute encephalopathy  - right gaze in ER, since resolved - CT head negative for intracranial process, -removed c-collar with negative ct P:  - Continue propofol and fentanyl with goal RA SS -3 while on high PEEP - Once ready for withdrawal then will proceed with a morphine drip  AKI (prerenal) baseline sCr 1.04 at prior discharge Hypernatremia - resolved AGMA/ lactic acidosis  BPH Hypercalcemia- improving Myoglobin > 500 c/w rhabdo, CK 645 - previous PTH low, normal vit D- unclear hx of malignancy (RLL opacity will need further evaluation, hx colon polyps by colonscopy in 2017 and BPH), and was on chlorthalidone but unclear if still taking S/p Calcitonin 4 units/kg SQ BID x 4 doses w/ pamidronate once  P:  - KVO IVF - No HD  Upon arrival of the son will proceed with comfort measures.  Best practice:  Diet: NPO Pain/Anxiety/Delirium protocol (if indicated): per pr VAP protocol (if indicated): yes DVT prophylaxis: heparin gtt GI prophylaxis: Pepcid Glucose control: SSI sensitive Mobility: BR Code Status: DNR Family Communication:  6/12 Kelsie, Kramp [phone].  Disposition: ICU  The patient is critically ill with multiple organ systems failure and requires high complexity decision making for assessment and support, frequent evaluation and titration of therapies, application of advanced monitoring technologies and extensive interpretation of multiple databases.   Critical Care Time devoted to patient care services described in this note is  32  Minutes. This time reflects time of care of this signee Dr Jennet Maduro. This critical care time does not reflect procedure time, or teaching time or supervisory time of PA/NP/Med student/Med Resident etc but could involve care discussion time.  Rush Farmer, M.D. [facility] Pulmonary/Critical Care Medicine. Pager: [phone]. After hours pager: [phone].  11-09-18, 10:51 AM

## 2018-11-18 DEATH — deceased

## 2018-12-19 NOTE — Death Summary Note (Signed)
DEATH SUMMARY   Patient Details  Name: Sean [name] MRN: [id number] DOB: Jun 04, 1950  Admission/Discharge Information   Admit Date:  October 31, 2018  Date of Death: Date of Death: 11-04-18  Time of Death: Time of Death: 07-31-2104  Length of Stay: 4  Referring Physician: Jani Gravel, MD   Reason(s) for Hospitalization  Acute respiratory failure with hypoxemia  Diagnoses  Preliminary cause of death:   Acute respiratory failure with hypoxemia  Secondary Diagnoses (including complications and co-morbidities):  Active Problems:   Respiratory failure (HCC)   Acute encephalopathy   Pressure injury of skin   Malnutrition of moderate degree   Brief Hospital Course (including significant findings, care, treatment, and services provided and events leading to death)  68 year old male found unresponsive and hypoxic at home . Required intubation for hypoxia and GCS of 8. CTH negative. Found to have AKI with severe hypercalcemia. CXR concerning for RLL infiltrate vs mass. Started on abx and transferred to [facility] for further evaluation and management.  Patient failed to make any improvement during ICU stay.    Brother, 2 sons and a daughter in law arrived from [place].  Many questions about condition.  I was very clear that I can not tell them that he has cancer but with history of missed to follow up colonic polyps 3 years ago, smoking and post obstructive pneumonia and bone lesions this is likely colon and lung cancer but can never tell without biopsy.  I agree with dr Elsworth Soho, the patient is unlikely to liberate from the ventilator and remains on 100% and PEEP of 10.  After a long discussion, they were clear the patient is a very private person and unless we can guarantee that he can go back to his normal life he would not want this level of care.  After a long discussion, decision was made to proceed with comfort care.  Morphine was started and patient was extubated to expire shortly thereafter with the  family bedside.  Pertinent Labs and Studies  Significant Diagnostic Studies Ct Abdomen Pelvis Wo Contrast  Result Date: 10/28/2018 CLINICAL DATA:  The patient was found unresponsive and CT workup of the head neck revealed adenopathy and lytic bony lesions. Chest radiography revealed a right lower lobe lesion for further workup. EXAM: CT CHEST, ABDOMEN AND PELVIS WITHOUT CONTRAST TECHNIQUE: Multidetector CT imaging of the chest, abdomen and pelvis was performed following the standard protocol without IV contrast. COMPARISON:  Multiple exams, including CT cervical spine from 10/28/2018 FINDINGS: CT CHEST FINDINGS Cardiovascular: Left anterior descending coronary artery and aortic arch atherosclerotic calcification. Mediastinum/Nodes: Bulky mediastinal adenopathy. Index right lower paratracheal node 2.4 cm in short axis on image 26/3. Index left lower paratracheal/AP window lymph node 2.5 cm in short axis on image 26/3. Prominent subcarinal and right hilar adenopathy. 3.0 by 2.3 cm left thyroid nodule. Lungs/Pleura: Volume loss and airspace opacity fills most of the right lower lobe, with narrowing of the right middle lobe and right lower lobe bronchi possibly from central tumor scared by the surrounding airspace opacity. Today's exam did not include IV contrast and accordingly it is especially difficult to separate atelectatic/consolidated lung from tumor in this vicinity. Mild bandlike atelectasis in the right middle lobe along with some very faint nodularity along the minor fissure. Indistinctly marginated peripheral subpleural nodularity in the right upper lobe posteriorly measures 0.8 by 0.7 cm on image 57/4. 4 mm left lower lobe pulmonary nodule on image 102/4. Bronchocele versus less likely dilated vessels from vascular  malformation in the left lower lobe for example on image 81/4. Musculoskeletal: Potential 1.3 cm lucent lesion in the right sternal manubrium. Potential speckled small indistinct lucencies  in thoracic vertebra although these are somewhat subtle. CT ABDOMEN PELVIS FINDINGS The patient's arm position by his sides causes streak artifact which obscure posterior regions in the upper abdomen, streak artifact from the patient's lumbar hardware and bilateral hip implants likewise reduces diagnostic sensitivity. Hepatobiliary: No appreciable mass. Please note that IV contrast was not given on today's exam. High density in the gallbladder probably from sludge. No appreciable biliary dilatation. Pancreas: Unremarkable Spleen: Unremarkable Adrenals/Urinary Tract: 2.8 by 2.0 cm left adrenal mass with internal density 27 Hounsfield units, nonspecific. Similar nonspecific 2.0 by 1.1 cm right adrenal mass. Exophytic left mid to lower kidney lesion 1.3 cm in diameter on image 71/3, otherwise nonspecific. Possible slightly hyperdense lesion of the left mid kidney measuring 2.5 by 1.9 cm on image 64/3, partially obscured by streak artifact from the patient's arms. No discrete urinary tract calculi are identified, although the distal ureters in urinary bladder are completely obscured by streak artifact from the patient's hip implants. Suspected urinary bladder catheter Stomach/Bowel: Unremarkable Vascular/Lymphatic: Aortoiliac atherosclerotic vascular disease. No pathologic adenopathy in the abdomen/pelvis. Reproductive: Prostate gland obscured by streak artifact from the patient's hip implants. Other: No supplemental non-categorized findings. Musculoskeletal: Lucency along the left upper acetabulum is nonspecific but could possibly be from particulate disease. A 6 mm lucency in the right iliac bone posterolateral rod and pedicle screw fixation at L3-L4-L5-S1 with posterior decompression. Chronic appearing grade 1 anterolisthesis at L4-5. Slight inferior endplate wedging at L2. There is likely central narrowing of the thecal sac at L2-3 due to spurring and disc bulge. IMPRESSION: 1. Bulky adenopathy in the chest favoring  malignancy. Given the narrowing of the right lower lobe and right middle lobe bronchi and airspace opacity and volume loss in the right lower lobe, the possibility of a right infrahilar tumor is raised although today's noncontrast CT imaging makes it difficult to differentiate tumor from consolidated lung. Lymphoma with extrinsic nodal narrowing of the right middle lobe and right lower lobe bronchi is a differential diagnostic consideration. 2. There several scattered lucencies in the thoracic vertebra, sternal manubrium, and right iliac bone which could reflect myeloma or small metastatic lesions. Nuclear medicine PET-CT could be helpful in further characterization. 3. 2.8 by 2.0 cm nonspecific left adrenal mass. 2.0 by 1.1 cm nonspecific right adrenal mass. 4. Small nodules in the right upper lobe and left lower lobe are nonspecific but merit surveillance. Probable bronchocele in the left lower lobe. 5. Other imaging findings of potential clinical significance: Aortic Atherosclerosis (ICD10-I70.0). Coronary atherosclerosis. Postoperative findings in the lumbar spine. Suspected central narrowing of the thecal sac at L2-3. Electronically Signed   By: Van Clines M.D.   On: 10/28/2018 07:54   Ct Head Wo Contrast  Result Date: 11/12/2018 CLINICAL DATA:  Altered level of consciousness. EXAM: CT HEAD WITHOUT CONTRAST TECHNIQUE: Contiguous axial images were obtained from the base of the skull through the vertex without intravenous contrast. COMPARISON:  None. FINDINGS: Brain: No evidence of acute infarction, hemorrhage, hydrocephalus, extra-axial collection or mass lesion/mass effect. Vascular: No hyperdense vessel or unexpected calcification. Skull: Normal. Negative for fracture or focal lesion. Sinuses/Orbits: Bilateral ethmoid and frontal sinusitis is noted. Other: Moderate right parietal scalp hematoma is noted. IMPRESSION: Moderate right parietal scalp hematoma. Bilateral ethmoid and frontal sinusitis. No  acute intracranial abnormality seen. Electronically Signed   By: Jeneen Rinks  Murlean Caller M.D.   On: 11/17/2018 21:11   Ct Chest Wo Contrast  Result Date: 10/28/2018 CLINICAL DATA:  The patient was found unresponsive and CT workup of the head neck revealed adenopathy and lytic bony lesions. Chest radiography revealed a right lower lobe lesion for further workup. EXAM: CT CHEST, ABDOMEN AND PELVIS WITHOUT CONTRAST TECHNIQUE: Multidetector CT imaging of the chest, abdomen and pelvis was performed following the standard protocol without IV contrast. COMPARISON:  Multiple exams, including CT cervical spine from 10/28/2018 FINDINGS: CT CHEST FINDINGS Cardiovascular: Left anterior descending coronary artery and aortic arch atherosclerotic calcification. Mediastinum/Nodes: Bulky mediastinal adenopathy. Index right lower paratracheal node 2.4 cm in short axis on image 26/3. Index left lower paratracheal/AP window lymph node 2.5 cm in short axis on image 26/3. Prominent subcarinal and right hilar adenopathy. 3.0 by 2.3 cm left thyroid nodule. Lungs/Pleura: Volume loss and airspace opacity fills most of the right lower lobe, with narrowing of the right middle lobe and right lower lobe bronchi possibly from central tumor scared by the surrounding airspace opacity. Today's exam did not include IV contrast and accordingly it is especially difficult to separate atelectatic/consolidated lung from tumor in this vicinity. Mild bandlike atelectasis in the right middle lobe along with some very faint nodularity along the minor fissure. Indistinctly marginated peripheral subpleural nodularity in the right upper lobe posteriorly measures 0.8 by 0.7 cm on image 57/4. 4 mm left lower lobe pulmonary nodule on image 102/4. Bronchocele versus less likely dilated vessels from vascular malformation in the left lower lobe for example on image 81/4. Musculoskeletal: Potential 1.3 cm lucent lesion in the right sternal manubrium. Potential speckled  small indistinct lucencies in thoracic vertebra although these are somewhat subtle. CT ABDOMEN PELVIS FINDINGS The patient's arm position by his sides causes streak artifact which obscure posterior regions in the upper abdomen, streak artifact from the patient's lumbar hardware and bilateral hip implants likewise reduces diagnostic sensitivity. Hepatobiliary: No appreciable mass. Please note that IV contrast was not given on today's exam. High density in the gallbladder probably from sludge. No appreciable biliary dilatation. Pancreas: Unremarkable Spleen: Unremarkable Adrenals/Urinary Tract: 2.8 by 2.0 cm left adrenal mass with internal density 27 Hounsfield units, nonspecific. Similar nonspecific 2.0 by 1.1 cm right adrenal mass. Exophytic left mid to lower kidney lesion 1.3 cm in diameter on image 71/3, otherwise nonspecific. Possible slightly hyperdense lesion of the left mid kidney measuring 2.5 by 1.9 cm on image 64/3, partially obscured by streak artifact from the patient's arms. No discrete urinary tract calculi are identified, although the distal ureters in urinary bladder are completely obscured by streak artifact from the patient's hip implants. Suspected urinary bladder catheter Stomach/Bowel: Unremarkable Vascular/Lymphatic: Aortoiliac atherosclerotic vascular disease. No pathologic adenopathy in the abdomen/pelvis. Reproductive: Prostate gland obscured by streak artifact from the patient's hip implants. Other: No supplemental non-categorized findings. Musculoskeletal: Lucency along the left upper acetabulum is nonspecific but could possibly be from particulate disease. A 6 mm lucency in the right iliac bone posterolateral rod and pedicle screw fixation at L3-L4-L5-S1 with posterior decompression. Chronic appearing grade 1 anterolisthesis at L4-5. Slight inferior endplate wedging at L2. There is likely central narrowing of the thecal sac at L2-3 due to spurring and disc bulge. IMPRESSION: 1. Bulky  adenopathy in the chest favoring malignancy. Given the narrowing of the right lower lobe and right middle lobe bronchi and airspace opacity and volume loss in the right lower lobe, the possibility of a right infrahilar tumor is raised although today's  noncontrast CT imaging makes it difficult to differentiate tumor from consolidated lung. Lymphoma with extrinsic nodal narrowing of the right middle lobe and right lower lobe bronchi is a differential diagnostic consideration. 2. There several scattered lucencies in the thoracic vertebra, sternal manubrium, and right iliac bone which could reflect myeloma or small metastatic lesions. Nuclear medicine PET-CT could be helpful in further characterization. 3. 2.8 by 2.0 cm nonspecific left adrenal mass. 2.0 by 1.1 cm nonspecific right adrenal mass. 4. Small nodules in the right upper lobe and left lower lobe are nonspecific but merit surveillance. Probable bronchocele in the left lower lobe. 5. Other imaging findings of potential clinical significance: Aortic Atherosclerosis (ICD10-I70.0). Coronary atherosclerosis. Postoperative findings in the lumbar spine. Suspected central narrowing of the thecal sac at L2-3. Electronically Signed   By: Van Clines M.D.   On: 10/28/2018 07:54   Ct Cervical Spine Wo Contrast  Result Date: 10/28/2018 CLINICAL DATA:  Found unresponsive on floor.  Hypertension. EXAM: CT CERVICAL SPINE WITHOUT CONTRAST TECHNIQUE: Multidetector CT imaging of the cervical spine was performed without intravenous contrast. Multiplanar CT image reconstructions were also generated. COMPARISON:  CT head 11/15/2018 FINDINGS: Alignment: AP alignment is anatomic. Mild leftward curvature is present. Skull base and vertebrae: Craniocervical junction is normal. No acute or healing fractures are present. Multiple well-defined lucencies are present in the cervical spine. Three discrete lesions are present at C3, the largest measures 4 mm inferiorly. A 3 mm  hypodense lesion is present inferiorly at C4 a smaller lesion is present superiorly at C5. A 2 mm lesion is present left at C6. A 4 mm lesion is present at T2. No definite posterior element lesions are present. Soft tissues and spinal canal: The left lobe of the thyroid is heterogeneously enlarged. A dominant nodule measures 2.3 x 2.1 cm. Paratracheal adenopathy is present at the thoracic inlet. Disc levels: Uncovertebral and facet hypertrophy contribute to right greater than left foraminal narrowing at C5-6 and C6-7. Uncovertebral disease contributes to left foraminal narrowing at C3-4. Upper chest: Extensive mediastinal adenopathy is present. Lung apices are clear. IMPRESSION: 1. No acute trauma. 2. Multiple well-defined hypodense lytic lesions throughout the cervical spine. The largest lesions are at C3 and T2. Findings are highly concerning for metastatic disease to the bone. 3. Extensive mediastinal adenopathy compatible with metastatic disease. 4. Asymmetric enlargement left thyroid with a dominant nodule measuring 2.3 x 2.1 cm. This most likely represents a thyroid nodule. Metastatic disease to the thyroid is considered much less likely. Electronically Signed   By: San Morelle M.D.   On: 10/28/2018 05:58   Dg Chest Port 1 View  Result Date: 10/30/2018 CLINICAL DATA:  Acute respiratory failure EXAM: PORTABLE CHEST 1 VIEW COMPARISON:  10/29/2018 FINDINGS: Cardiac shadow is stable. Endotracheal tube and gastric catheter are noted in satisfactory position. Increasing right perihilar infiltrate is noted likely related to some postobstructive change. No sizable effusion is noted. IMPRESSION: Persistent and slightly increased right perihilar infiltrative density with some likely postobstructive changes. Electronically Signed   By: Inez Catalina M.D.   On: 10/30/2018 08:03   Portable Chest Xray  Result Date: 10/29/2018 CLINICAL DATA:  Status post intubation EXAM: PORTABLE CHEST 1 VIEW COMPARISON:   10/27/2028 FINDINGS: Endotracheal tube and gastric catheter are again noted and stable. Right hilar fullness is again seen and stable. No bony abnormality is seen. IMPRESSION: Tubes and lines as described. Right hilar fullness similar to that seen on prior exams. Electronically Signed   By: Inez Catalina  M.D.   On: 10/29/2018 08:35   Dg Chest Portable 1 View  Result Date: 10/28/2018 CLINICAL DATA:  Status post intubation EXAM: PORTABLE CHEST 1 VIEW COMPARISON:  10/28/2018 FINDINGS: Cardiac shadows within normal limits. Endotracheal tube and gastric catheter are seen. Fullness in the right hilar region and infrahilar region is noted consistent with that seen on recent CT examination. Lungs are otherwise clear. The degree of aeration in the right lower lobe has improved when compared with the prior exam. IMPRESSION: Right hilar fullness similar to that seen on recent CT examination. Electronically Signed   By: Inez Catalina M.D.   On: 10/28/2018 15:27   Dg Chest Portable 1 View  Result Date: 10/23/2018 CLINICAL DATA:  Found unresponsive with low oxygen saturation. EXAM: PORTABLE CHEST 1 VIEW COMPARISON:  04/09/2013 FINDINGS: Endotracheal tube tip is 4 cm above the carina. Orogastric or nasogastric tube enters the abdomen. The left lung is clear. There is abnormal opacity in the medial right lower lung. This could represent pneumonia or a mass lesion. No apparent effusion. IMPRESSION: Endotracheal tube in good position. Abnormal opacity in the right medial lower chest could be pneumonia or a mass lesion. Electronically Signed   By: Nelson Chimes M.D.   On: 11/08/2018 20:33   Vas [place] Lower Extremity Venous (dvt)  Result Date: 10/28/2018  Lower Venous Study Indications: Stroke, and underlying malignancy w/ hypoixa.  Performing Technologist: Abram Sander RVS  Examination Guidelines: A complete evaluation includes B-mode imaging, spectral Doppler, color Doppler, and power Doppler as needed of all accessible portions  of each vessel. Bilateral testing is considered an integral part of a complete examination. Limited examinations for reoccurring indications may be performed as noted.  +---------+---------------+---------+-----------+----------+--------------+ RIGHT    CompressibilityPhasicitySpontaneityPropertiesSummary        +---------+---------------+---------+-----------+----------+--------------+ CFV      Full           Yes      Yes                                 +---------+---------------+---------+-----------+----------+--------------+ SFJ      Full                                                        +---------+---------------+---------+-----------+----------+--------------+ FV Prox  Full                                                        +---------+---------------+---------+-----------+----------+--------------+ FV Mid   Full                                                        +---------+---------------+---------+-----------+----------+--------------+ FV DistalFull                                                        +---------+---------------+---------+-----------+----------+--------------+  PFV      Full                                                        +---------+---------------+---------+-----------+----------+--------------+ POP      Partial        Yes      Yes                  Acute          +---------+---------------+---------+-----------+----------+--------------+ PTV      Full                                                        +---------+---------------+---------+-----------+----------+--------------+ PERO                                                  Not visualized +---------+---------------+---------+-----------+----------+--------------+   +---------+---------------+---------+-----------+----------+--------------+ LEFT     CompressibilityPhasicitySpontaneityPropertiesSummary         +---------+---------------+---------+-----------+----------+--------------+ CFV      None           No       No                   Acute          +---------+---------------+---------+-----------+----------+--------------+ SFJ      Full                                                        +---------+---------------+---------+-----------+----------+--------------+ FV Prox  None                                         Acute          +---------+---------------+---------+-----------+----------+--------------+ FV Mid   None                                         Acute          +---------+---------------+---------+-----------+----------+--------------+ FV DistalNone                                         Acute          +---------+---------------+---------+-----------+----------+--------------+ PFV      None                                         Acute          +---------+---------------+---------+-----------+----------+--------------+ POP      None  No       No                   Acute          +---------+---------------+---------+-----------+----------+--------------+ PTV      None                                         Acute          +---------+---------------+---------+-----------+----------+--------------+ PERO                                                  Not visualized +---------+---------------+---------+-----------+----------+--------------+     Summary: Right: Findings consistent with acute deep vein thrombosis involving the right popliteal vein. No cystic structure found in the popliteal fossa. Left: Findings consistent with acute deep vein thrombosis involving the left common femoral vein, left femoral vein, left proximal profunda vein, left popliteal vein, and left posterior tibial vein. No cystic structure found in the popliteal fossa.  *See table(s) above for measurements and observations. Electronically signed by Curt Jews MD on  10/28/2018 at 4:58:06 PM.    Final     Microbiology No results found for this or any previous visit (from the past 240 hour(s)).  Lab Basic Metabolic Panel: No results for input(s): NA, K, CL, CO2, GLUCOSE, BUN, CREATININE, CALCIUM, MG, PHOS in the last 168 hours. Liver Function Tests: No results for input(s): AST, ALT, ALKPHOS, BILITOT, PROT, ALBUMIN in the last 168 hours. No results for input(s): LIPASE, AMYLASE in the last 168 hours. No results for input(s): AMMONIA in the last 168 hours. CBC: No results for input(s): WBC, NEUTROABS, HGB, HCT, MCV, PLT in the last 168 hours. Cardiac Enzymes: No results for input(s): CKTOTAL, CKMB, CKMBINDEX, TROPONINI in the last 168 hours. Sepsis Labs: No results for input(s): PROCALCITON, WBC, LATICACIDVEN in the last 168 hours.  Procedures/Operations     Jennet Maduro 11/21/2018, 4:58 PM

## 2020-07-28 IMAGING — DX DG CERVICAL SPINE COMPLETE 4+V
6 series · 6 of 6 positions shown · non-contrast
Comparison: 06/08/2012

CLINICAL DATA: Neck pain, reduced range of motion

EXAM:
CERVICAL SPINE - COMPLETE 4+ VIEW

[c-spine lat]
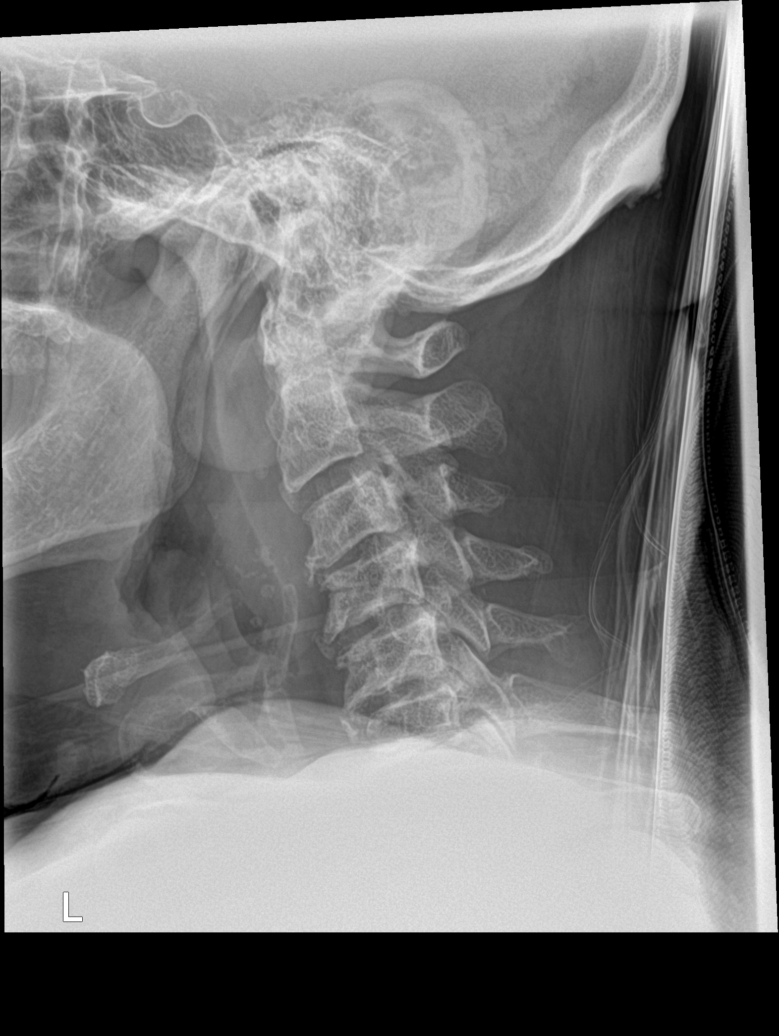

[c-spine swimmers trauma]
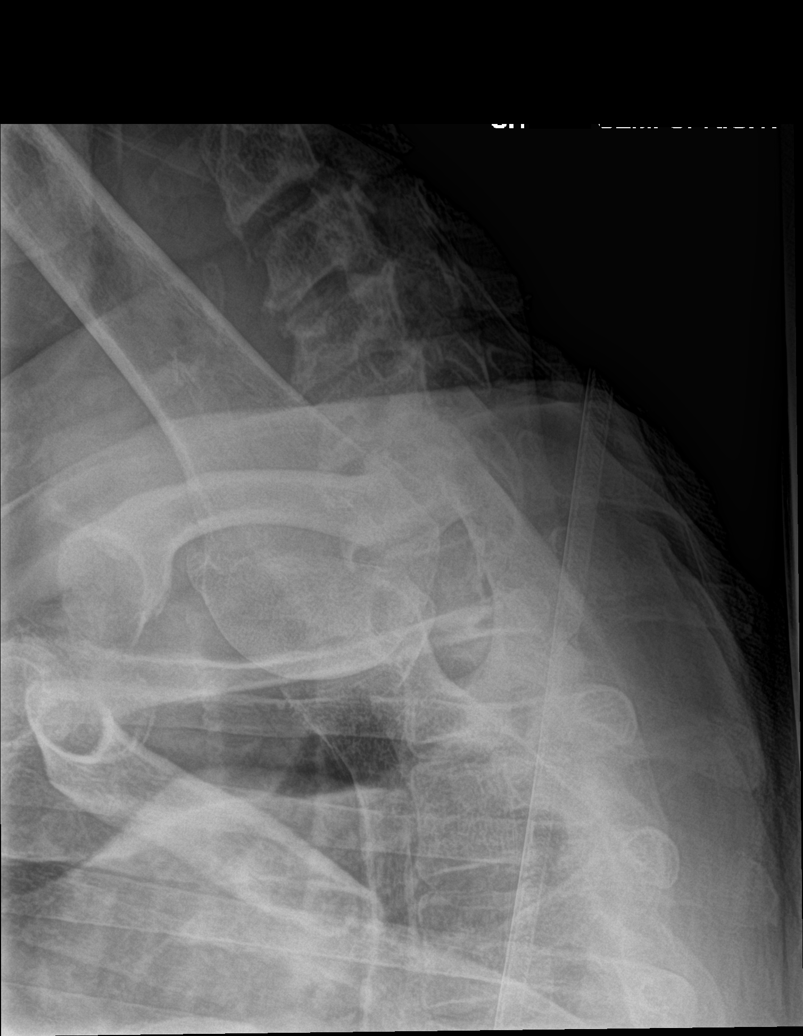

[c-spine obl (1 of 2)]
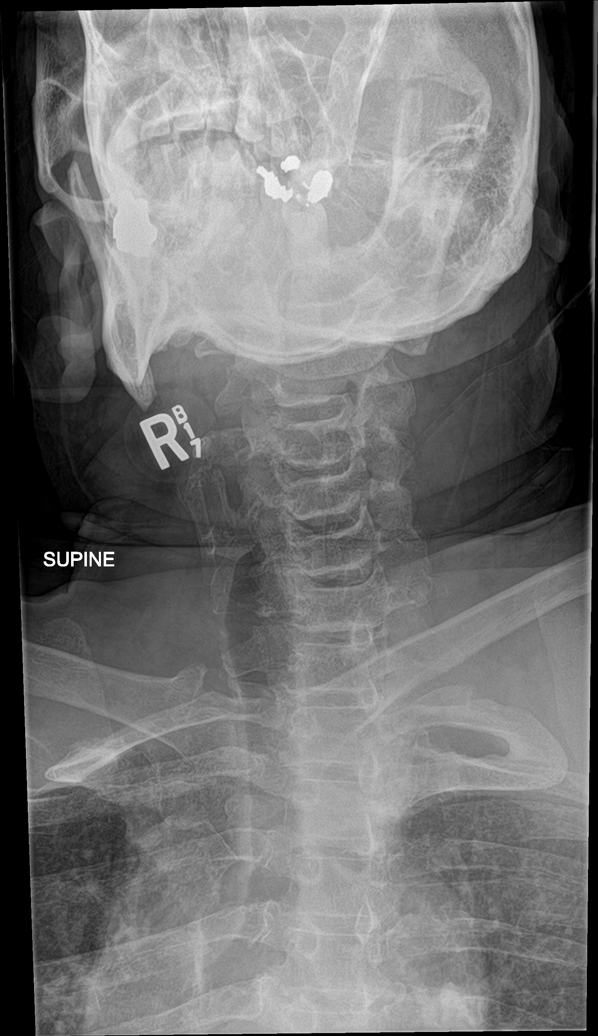

[c-spine obl (2 of 2)]
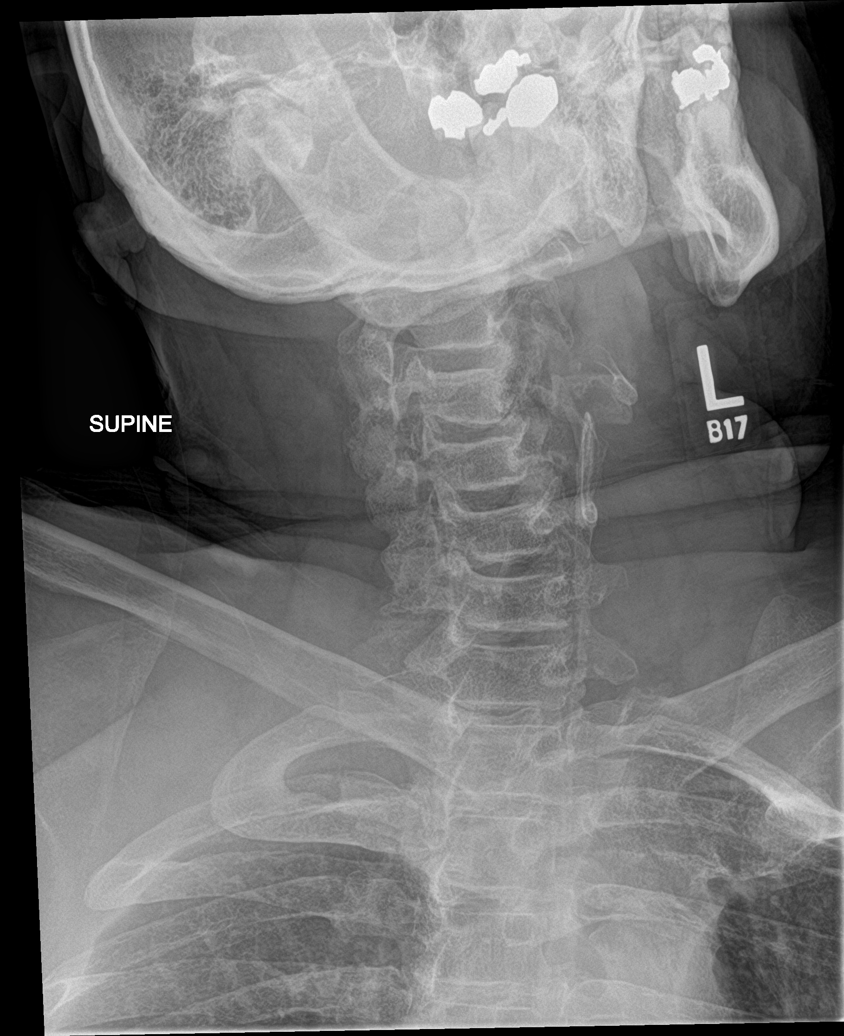

[c-spine ap (1 of 2)]
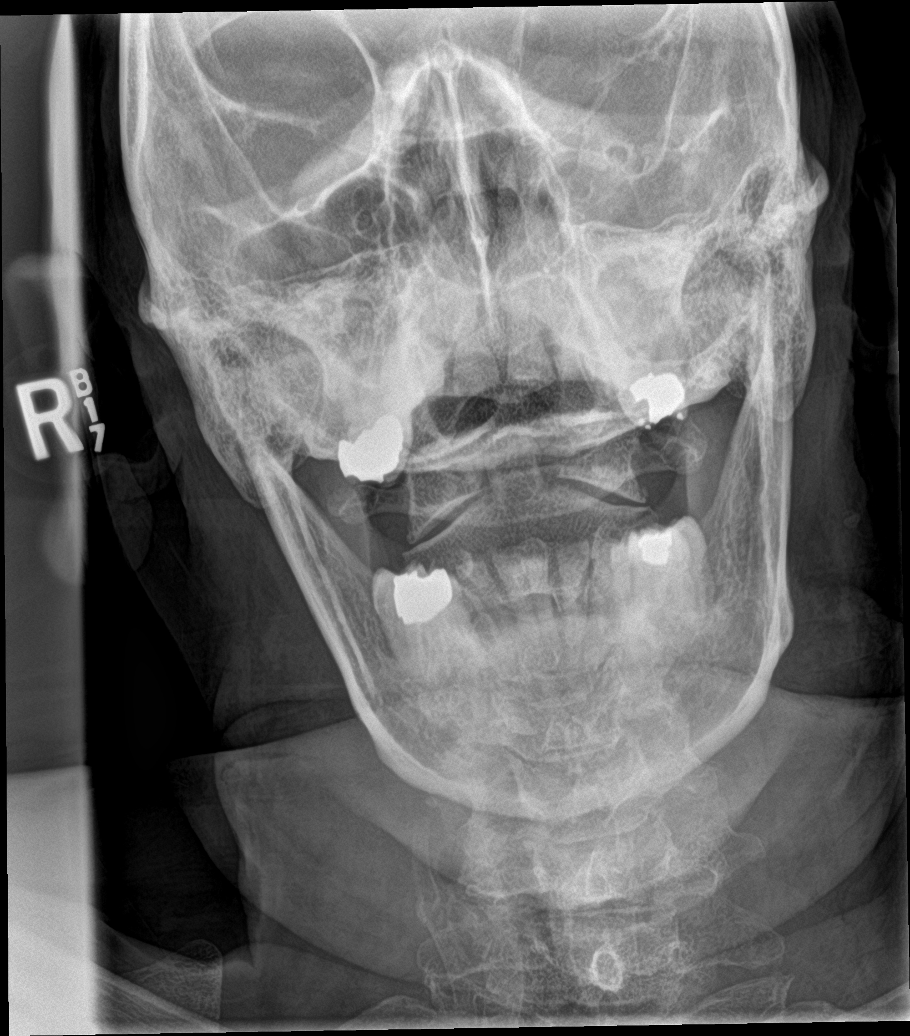

[c-spine ap (2 of 2)]
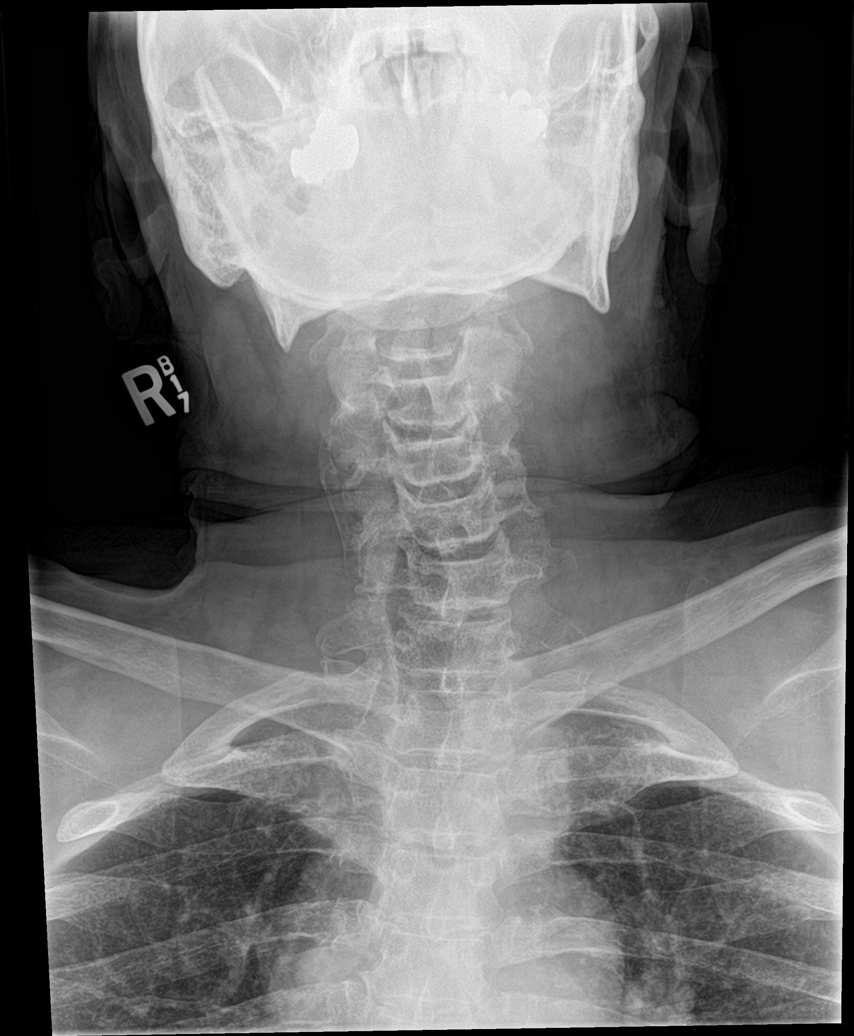

[6 of 6 positions shown; findings below may reference images not displayed]

FINDINGS: Reversal of cervical lordosis question muscle spasm.

Bones demineralized.

Multilevel disc space narrowing and endplate spur formation.

Prevertebral soft tissues upper normal thickness at the upper
cervical region.

Lateral cervical flexion to the RIGHT.

Vertebral body heights maintained without fracture or subluxation.

No bone destruction.

Scattered facet degenerative changes.

Apices clear.
IMPRESSION: Degenerative disc and facet disease changes of the cervical spine
with lateral cervical flexion to the RIGHT.

## 2021-03-25 IMAGING — DX LEFT KNEE - COMPLETE 4+ VIEW
4 series · 5 of 5 positions shown · non-contrast
Comparison: None

CLINICAL DATA: Knee pain post fall, weakness, difficulty moving

EXAM:
LEFT KNEE - COMPLETE 4+ VIEW

[knee ap (1 of 3)]
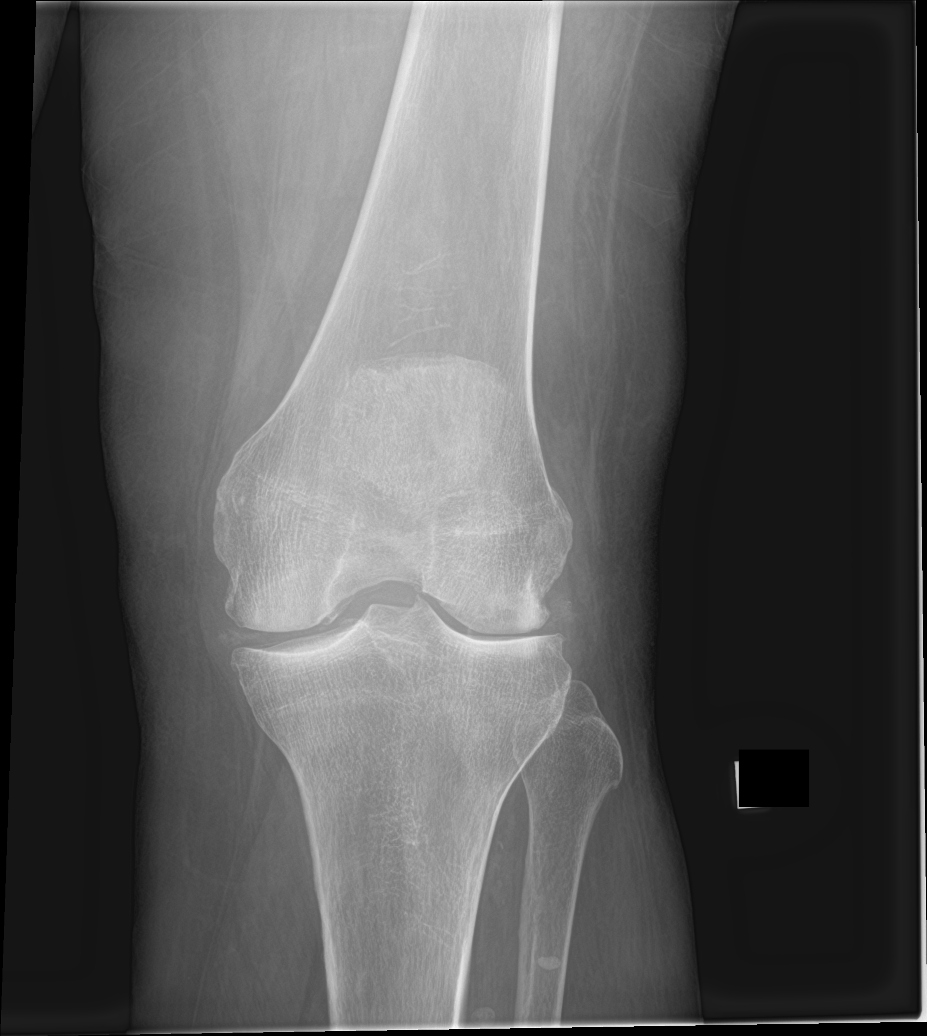

[knee ap (2 of 3)]
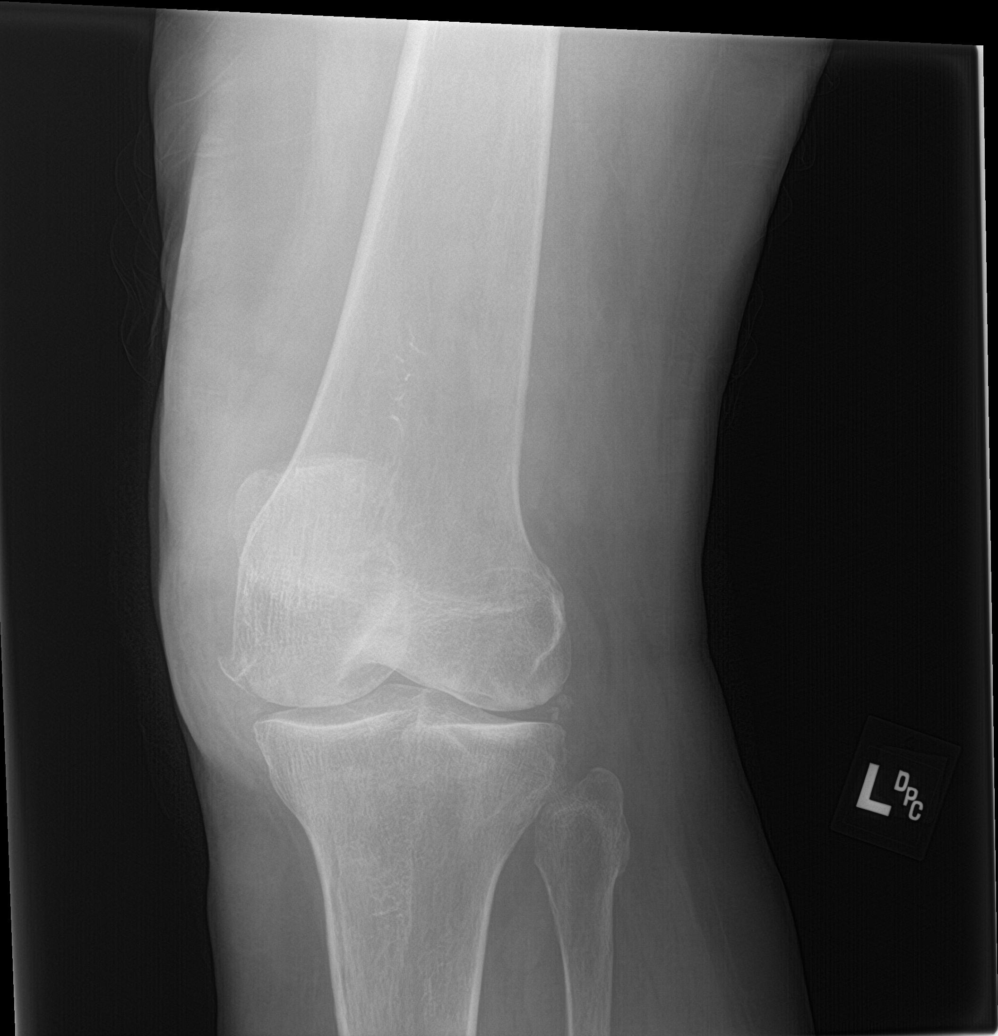

[knee ap (3 of 3)]
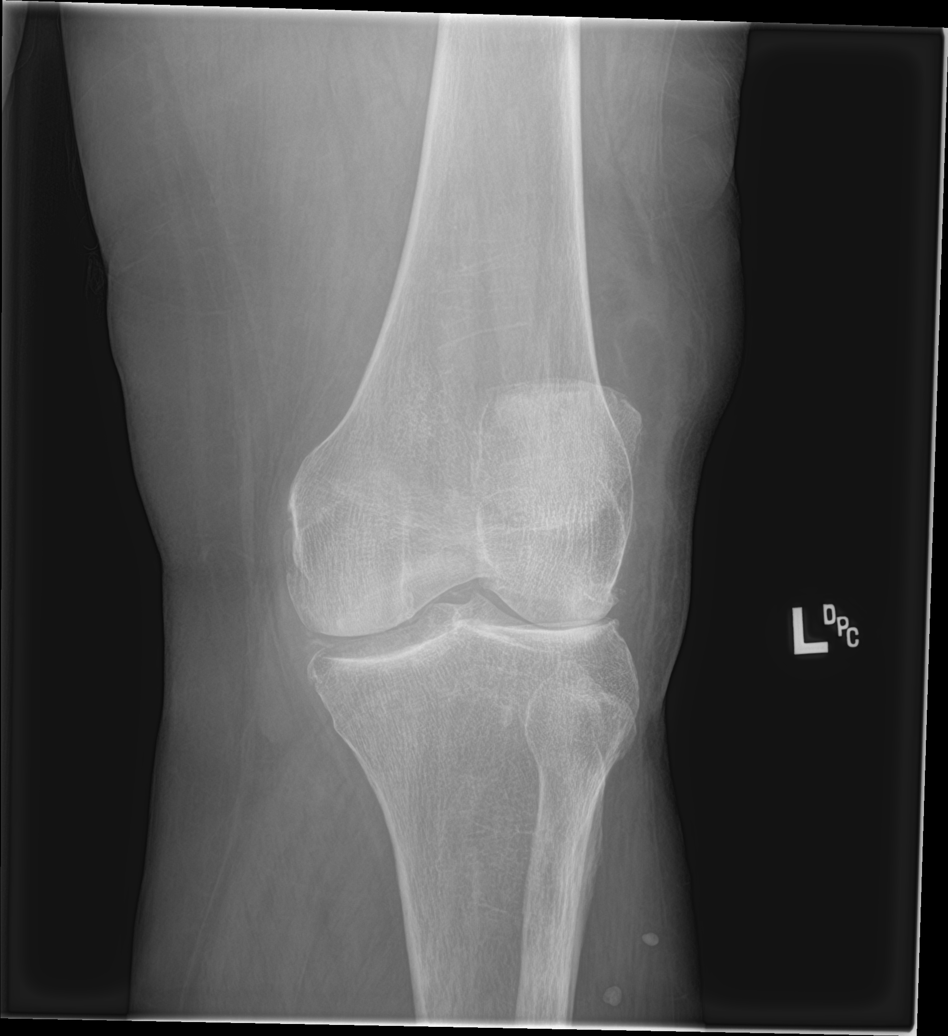

[Series 4: knee lat · 0.14mm/px · 2 of 2 slices shown]
[im 1/2]
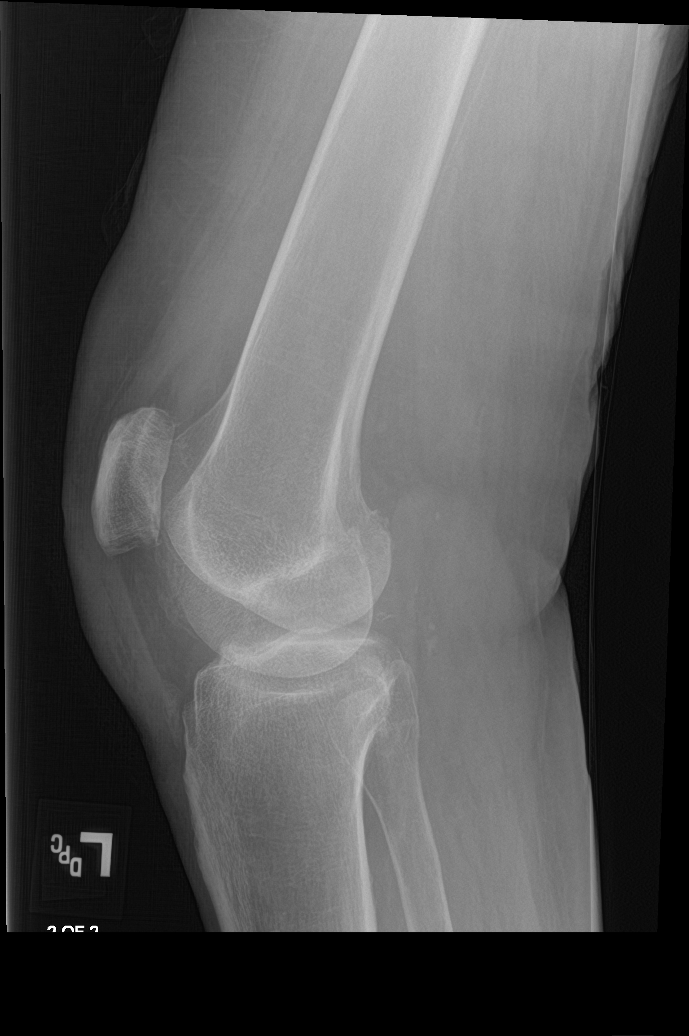
[im 2/2]
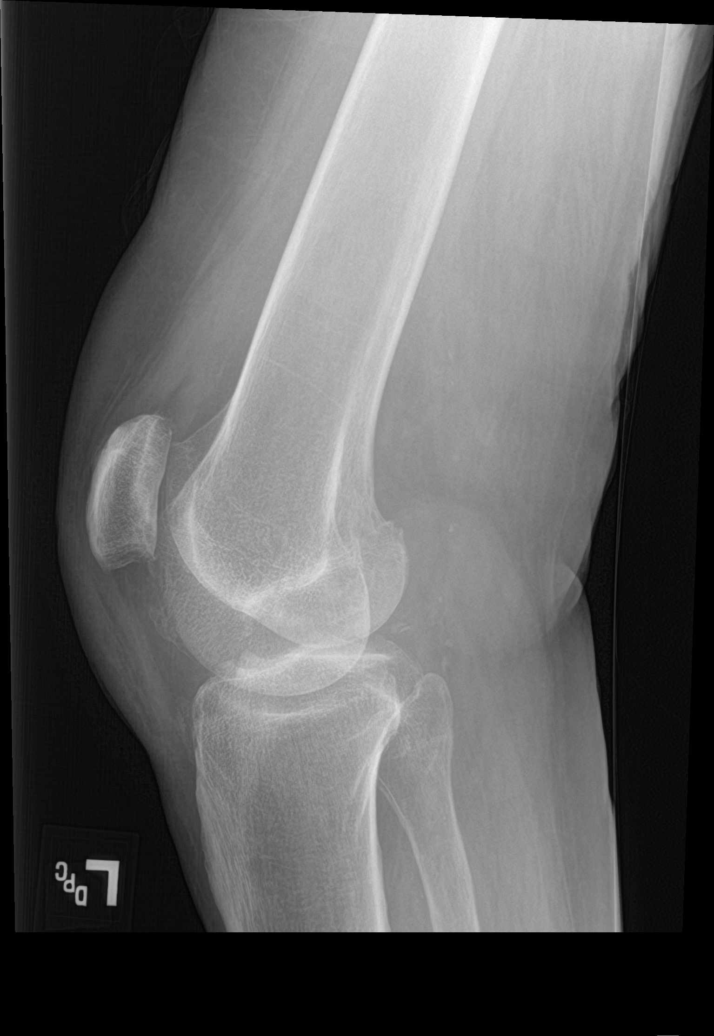

[5 of 5 positions shown; findings below may reference images not displayed]

FINDINGS: Osseous demineralization.

Diffuse joint space narrowing greatest at lateral compartment.

Chondrocalcinosis question CPPD.

Anterior soft tissue swelling especially infrapatellar.

No acute fracture, dislocation or bone destruction.

Minimal joint effusion.
IMPRESSION: Degenerative changes and question CPPD LEFT knee.

Minimal joint effusion.

No acute osseous abnormalities.
